# Patient Record
Sex: Female | Born: 1935 | Race: White | Hispanic: No | Smoking: Never smoker
Health system: Southern US, Community
[De-identification: ages and names within clinical notes are randomized; demographics above are authoritative.]

## PROBLEM LIST (undated history)

## (undated) DIAGNOSIS — I639 Cerebral infarction, unspecified: Secondary | ICD-10-CM

## (undated) DIAGNOSIS — M545 Low back pain, unspecified: Secondary | ICD-10-CM

## (undated) DIAGNOSIS — J189 Pneumonia, unspecified organism: Secondary | ICD-10-CM

## (undated) DIAGNOSIS — I10 Essential (primary) hypertension: Secondary | ICD-10-CM

## (undated) DIAGNOSIS — D510 Vitamin B12 deficiency anemia due to intrinsic factor deficiency: Secondary | ICD-10-CM

## (undated) DIAGNOSIS — D72829 Elevated white blood cell count, unspecified: Secondary | ICD-10-CM

## (undated) DIAGNOSIS — Z9289 Personal history of other medical treatment: Secondary | ICD-10-CM

## (undated) DIAGNOSIS — G8929 Other chronic pain: Secondary | ICD-10-CM

## (undated) DIAGNOSIS — H9190 Unspecified hearing loss, unspecified ear: Secondary | ICD-10-CM

## (undated) DIAGNOSIS — C801 Malignant (primary) neoplasm, unspecified: Secondary | ICD-10-CM

## (undated) DIAGNOSIS — A389 Scarlet fever, uncomplicated: Secondary | ICD-10-CM

## (undated) DIAGNOSIS — G459 Transient cerebral ischemic attack, unspecified: Secondary | ICD-10-CM

## (undated) DIAGNOSIS — D509 Iron deficiency anemia, unspecified: Secondary | ICD-10-CM

## (undated) DIAGNOSIS — J449 Chronic obstructive pulmonary disease, unspecified: Secondary | ICD-10-CM

## (undated) DIAGNOSIS — N390 Urinary tract infection, site not specified: Secondary | ICD-10-CM

## (undated) HISTORY — PX: APPENDECTOMY: SHX54

## (undated) HISTORY — DX: Chronic obstructive pulmonary disease, unspecified: J44.9

## (undated) HISTORY — DX: Elevated white blood cell count, unspecified: D72.829

## (undated) HISTORY — PX: KYPHOPLASTY: SHX5884

## (undated) HISTORY — PX: CATARACT EXTRACTION W/ INTRAOCULAR LENS  IMPLANT, BILATERAL: SHX1307

## (undated) HISTORY — DX: Iron deficiency anemia, unspecified: D50.9

## (undated) HISTORY — PX: BACK SURGERY: SHX140

## (undated) HISTORY — PX: ANKLE FRACTURE SURGERY: SHX122

## (undated) HISTORY — PX: LUMBAR DISC SURGERY: SHX700

---
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
## 1971-07-01 HISTORY — PX: ABDOMINAL HYSTERECTOMY: SHX81

## 1998-10-30 ENCOUNTER — Encounter: Payer: Self-pay | Admitting: Neurosurgery

## 1998-11-01 ENCOUNTER — Inpatient Hospital Stay (HOSPITAL_COMMUNITY): Admission: RE | Admit: 1998-11-01 | Discharge: 1998-11-03 | Payer: Self-pay | Admitting: Neurosurgery

## 1998-11-01 ENCOUNTER — Encounter: Payer: Self-pay | Admitting: Neurosurgery

## 1998-11-02 ENCOUNTER — Encounter: Payer: Self-pay | Admitting: Neurosurgery

## 1998-11-03 ENCOUNTER — Encounter: Payer: Self-pay | Admitting: Neurosurgery

## 1998-12-28 ENCOUNTER — Ambulatory Visit (HOSPITAL_COMMUNITY): Admission: RE | Admit: 1998-12-28 | Discharge: 1998-12-28 | Payer: Self-pay | Admitting: Neurosurgery

## 1998-12-28 ENCOUNTER — Encounter: Payer: Self-pay | Admitting: Neurosurgery

## 1999-01-03 ENCOUNTER — Encounter: Payer: Self-pay | Admitting: Neurosurgery

## 1999-01-03 ENCOUNTER — Ambulatory Visit (HOSPITAL_COMMUNITY): Admission: RE | Admit: 1999-01-03 | Discharge: 1999-01-03 | Payer: Self-pay | Admitting: Neurosurgery

## 1999-01-07 ENCOUNTER — Inpatient Hospital Stay (HOSPITAL_COMMUNITY): Admission: RE | Admit: 1999-01-07 | Discharge: 1999-01-08 | Payer: Self-pay | Admitting: Neurosurgery

## 1999-01-11 ENCOUNTER — Ambulatory Visit (HOSPITAL_COMMUNITY): Admission: RE | Admit: 1999-01-11 | Discharge: 1999-01-11 | Payer: Self-pay | Admitting: Neurosurgery

## 1999-09-27 ENCOUNTER — Encounter: Payer: Self-pay | Admitting: Family Medicine

## 1999-09-27 ENCOUNTER — Encounter: Admission: RE | Admit: 1999-09-27 | Discharge: 1999-09-27 | Payer: Self-pay | Admitting: Family Medicine

## 2002-08-01 ENCOUNTER — Encounter: Payer: Self-pay | Admitting: Family Medicine

## 2002-08-01 ENCOUNTER — Encounter: Admission: RE | Admit: 2002-08-01 | Discharge: 2002-08-01 | Payer: Self-pay | Admitting: Family Medicine

## 2005-12-19 ENCOUNTER — Encounter: Payer: Self-pay | Admitting: Emergency Medicine

## 2005-12-19 ENCOUNTER — Ambulatory Visit: Payer: Self-pay | Admitting: Cardiology

## 2005-12-20 ENCOUNTER — Inpatient Hospital Stay (HOSPITAL_COMMUNITY): Admission: EM | Admit: 2005-12-20 | Discharge: 2005-12-26 | Payer: Self-pay | Admitting: Internal Medicine

## 2005-12-22 ENCOUNTER — Ambulatory Visit: Payer: Self-pay | Admitting: Physical Medicine & Rehabilitation

## 2005-12-29 ENCOUNTER — Ambulatory Visit (HOSPITAL_COMMUNITY): Admission: RE | Admit: 2005-12-29 | Discharge: 2005-12-29 | Payer: Self-pay | Admitting: Interventional Radiology

## 2006-01-05 ENCOUNTER — Encounter: Admission: RE | Admit: 2006-01-05 | Discharge: 2006-01-29 | Payer: Self-pay | Admitting: Internal Medicine

## 2011-07-01 DIAGNOSIS — Z9289 Personal history of other medical treatment: Secondary | ICD-10-CM

## 2011-07-01 HISTORY — DX: Personal history of other medical treatment: Z92.89

## 2012-03-17 ENCOUNTER — Inpatient Hospital Stay (HOSPITAL_COMMUNITY)
Admission: EM | Admit: 2012-03-17 | Discharge: 2012-03-24 | DRG: 469 | Disposition: A | Discharge status: Skilled Nursing Facility | Payer: Medicare Other | Attending: Internal Medicine | Admitting: Internal Medicine

## 2012-03-17 ENCOUNTER — Inpatient Hospital Stay (HOSPITAL_COMMUNITY): Payer: Medicare Other

## 2012-03-17 ENCOUNTER — Encounter (HOSPITAL_COMMUNITY)
Admission: EM | Disposition: A | Discharge status: Skilled Nursing Facility | Payer: Self-pay | Source: Home / Self Care | Attending: Internal Medicine

## 2012-03-17 ENCOUNTER — Encounter (HOSPITAL_COMMUNITY): Payer: Self-pay | Admitting: *Deleted

## 2012-03-17 ENCOUNTER — Emergency Department (HOSPITAL_COMMUNITY): Payer: Medicare Other

## 2012-03-17 ENCOUNTER — Inpatient Hospital Stay (HOSPITAL_COMMUNITY): Payer: Medicare Other | Admitting: *Deleted

## 2012-03-17 DIAGNOSIS — S72001A Fracture of unspecified part of neck of right femur, initial encounter for closed fracture: Secondary | ICD-10-CM | POA: Diagnosis present

## 2012-03-17 DIAGNOSIS — Y92009 Unspecified place in unspecified non-institutional (private) residence as the place of occurrence of the external cause: Secondary | ICD-10-CM

## 2012-03-17 DIAGNOSIS — J189 Pneumonia, unspecified organism: Secondary | ICD-10-CM

## 2012-03-17 DIAGNOSIS — R0902 Hypoxemia: Secondary | ICD-10-CM | POA: Diagnosis present

## 2012-03-17 DIAGNOSIS — D696 Thrombocytopenia, unspecified: Secondary | ICD-10-CM | POA: Diagnosis present

## 2012-03-17 DIAGNOSIS — S72009A Fracture of unspecified part of neck of unspecified femur, initial encounter for closed fracture: Principal | ICD-10-CM | POA: Diagnosis present

## 2012-03-17 DIAGNOSIS — J4489 Other specified chronic obstructive pulmonary disease: Secondary | ICD-10-CM | POA: Diagnosis present

## 2012-03-17 DIAGNOSIS — D62 Acute posthemorrhagic anemia: Secondary | ICD-10-CM | POA: Diagnosis not present

## 2012-03-17 DIAGNOSIS — Z7901 Long term (current) use of anticoagulants: Secondary | ICD-10-CM

## 2012-03-17 DIAGNOSIS — Z79899 Other long term (current) drug therapy: Secondary | ICD-10-CM

## 2012-03-17 DIAGNOSIS — G8190 Hemiplegia, unspecified affecting unspecified side: Secondary | ICD-10-CM

## 2012-03-17 DIAGNOSIS — W010XXA Fall on same level from slipping, tripping and stumbling without subsequent striking against object, initial encounter: Secondary | ICD-10-CM | POA: Diagnosis present

## 2012-03-17 DIAGNOSIS — D649 Anemia, unspecified: Secondary | ICD-10-CM

## 2012-03-17 DIAGNOSIS — I63511 Cerebral infarction due to unspecified occlusion or stenosis of right middle cerebral artery: Secondary | ICD-10-CM

## 2012-03-17 DIAGNOSIS — D509 Iron deficiency anemia, unspecified: Secondary | ICD-10-CM

## 2012-03-17 DIAGNOSIS — J449 Chronic obstructive pulmonary disease, unspecified: Secondary | ICD-10-CM

## 2012-03-17 DIAGNOSIS — D72829 Elevated white blood cell count, unspecified: Secondary | ICD-10-CM

## 2012-03-17 DIAGNOSIS — I6350 Cerebral infarction due to unspecified occlusion or stenosis of unspecified cerebral artery: Secondary | ICD-10-CM | POA: Diagnosis not present

## 2012-03-17 DIAGNOSIS — I639 Cerebral infarction, unspecified: Secondary | ICD-10-CM | POA: Diagnosis present

## 2012-03-17 DIAGNOSIS — Z8673 Personal history of transient ischemic attack (TIA), and cerebral infarction without residual deficits: Secondary | ICD-10-CM

## 2012-03-17 DIAGNOSIS — I10 Essential (primary) hypertension: Secondary | ICD-10-CM

## 2012-03-17 DIAGNOSIS — K5900 Constipation, unspecified: Secondary | ICD-10-CM | POA: Diagnosis present

## 2012-03-17 HISTORY — DX: Essential (primary) hypertension: I10

## 2012-03-17 HISTORY — PX: HIP ARTHROPLASTY: SHX981

## 2012-03-17 LAB — COMPREHENSIVE METABOLIC PANEL
ALT: 9 U/L (ref 0–35)
Albumin: 3.2 g/dL — ABNORMAL LOW (ref 3.5–5.2)
Alkaline Phosphatase: 105 U/L (ref 39–117)
BUN: 15 mg/dL (ref 6–23)
Chloride: 99 mEq/L (ref 96–112)
GFR calc Af Amer: 81 mL/min — ABNORMAL LOW (ref >90)
GFR calc non Af Amer: 70 mL/min — ABNORMAL LOW (ref >90)
Glucose, Bld: 170 mg/dL — ABNORMAL HIGH (ref 70–99)
Potassium: 4.7 mEq/L (ref 3.5–5.1)
Sodium: 135 mEq/L (ref 135–145)
Total Protein: 6.5 g/dL (ref 6.0–8.3)

## 2012-03-17 LAB — CBC WITH DIFFERENTIAL/PLATELET
Basophils Relative: 0 % (ref 0–1)
Eosinophils Relative: 0 % (ref 0–5)
Hemoglobin: 11.8 g/dL — ABNORMAL LOW (ref 12.0–15.0)
Lymphocytes Relative: 8 % — ABNORMAL LOW (ref 12–46)
MCH: 25.3 pg — ABNORMAL LOW (ref 26.0–34.0)
MCV: 78.6 fL (ref 78.0–100.0)
Platelets: 219 10*3/uL (ref 150–400)

## 2012-03-17 LAB — PROTIME-INR: INR: 1.09 (ref 0.00–1.49)

## 2012-03-17 LAB — APTT: aPTT: 29 seconds (ref 24–37)

## 2012-03-17 SURGERY — HEMIARTHROPLASTY, HIP, DIRECT ANTERIOR APPROACH, FOR FRACTURE
Anesthesia: General | Site: Hip | Laterality: Right | Wound class: Clean

## 2012-03-17 MED ORDER — PHENYLEPHRINE HCL 10 MG/ML IJ SOLN
INTRAMUSCULAR | Status: DC | PRN
  Administered 2012-03-17 (×4): 40 ug via INTRAVENOUS
  Administered 2012-03-17 (×2): 80 ug via INTRAVENOUS
  Administered 2012-03-17: 60 ug via INTRAVENOUS
  Administered 2012-03-17: 80 ug via INTRAVENOUS
  Administered 2012-03-17: 60 ug via INTRAVENOUS
  Administered 2012-03-17: 40 ug via INTRAVENOUS
  Administered 2012-03-17: 80 ug via INTRAVENOUS
  Administered 2012-03-17: 40 ug via INTRAVENOUS

## 2012-03-17 MED ORDER — FENTANYL CITRATE 0.05 MG/ML IJ SOLN
INTRAMUSCULAR | Status: DC | PRN
  Administered 2012-03-17: 40 ug via INTRAVENOUS

## 2012-03-17 MED ORDER — OXYCODONE HCL 5 MG PO TABS
5.0000 mg | ORAL_TABLET | ORAL | Status: DC | PRN
  Administered 2012-03-18 – 2012-03-20 (×4): 5 mg via ORAL
  Filled 2012-03-17 (×4): qty 1

## 2012-03-17 MED ORDER — SENNA 8.6 MG PO TABS
1.0000 | ORAL_TABLET | Freq: Two times a day (BID) | ORAL | Status: DC
  Administered 2012-03-18 – 2012-03-24 (×12): 8.6 mg via ORAL
  Filled 2012-03-17 (×14): qty 1

## 2012-03-17 MED ORDER — HYDROCODONE-ACETAMINOPHEN 5-325 MG PO TABS: 1.0000 | ORAL_TABLET | Freq: Four times a day (QID) | ORAL | Status: DC | PRN

## 2012-03-17 MED ORDER — SODIUM CHLORIDE 0.9 % IR SOLN
Status: DC | PRN
  Administered 2012-03-17: 1

## 2012-03-17 MED ORDER — DOCUSATE SODIUM 100 MG PO CAPS
100.0000 mg | ORAL_CAPSULE | Freq: Two times a day (BID) | ORAL | Status: DC
  Administered 2012-03-18 – 2012-03-24 (×12): 100 mg via ORAL
  Filled 2012-03-17 (×14): qty 1

## 2012-03-17 MED ORDER — HYDROMORPHONE HCL PF 1 MG/ML IJ SOLN: 0.2500 mg | INTRAMUSCULAR | Status: DC | PRN

## 2012-03-17 MED ORDER — WARFARIN - PHARMACIST DOSING INPATIENT: Freq: Every day | Status: DC

## 2012-03-17 MED ORDER — HYDROCODONE-ACETAMINOPHEN 5-325 MG PO TABS
1.0000 | ORAL_TABLET | ORAL | Status: DC | PRN
  Administered 2012-03-18: 1 via ORAL
  Filled 2012-03-17: qty 1

## 2012-03-17 MED ORDER — WARFARIN SODIUM 2.5 MG PO TABS
2.5000 mg | ORAL_TABLET | Freq: Once | ORAL | Status: AC
  Administered 2012-03-18: 2.5 mg via ORAL
  Filled 2012-03-17: qty 1

## 2012-03-17 MED ORDER — MORPHINE SULFATE 2 MG/ML IJ SOLN: 1.0000 mg | INTRAMUSCULAR | Status: DC | PRN

## 2012-03-17 MED ORDER — WARFARIN VIDEO: 1.0000 | Freq: Once | Status: DC

## 2012-03-17 MED ORDER — MORPHINE SULFATE 4 MG/ML IJ SOLN
4.0000 mg | Freq: Once | INTRAMUSCULAR | Status: AC
  Administered 2012-03-17: 4 mg via INTRAVENOUS
  Filled 2012-03-17: qty 1

## 2012-03-17 MED ORDER — MORPHINE SULFATE 2 MG/ML IJ SOLN: 2.0000 mg | Freq: Once | INTRAMUSCULAR | Status: DC

## 2012-03-17 MED ORDER — OXYCODONE HCL 5 MG PO TABS: 5.0000 mg | ORAL_TABLET | Freq: Once | ORAL | Status: DC | PRN

## 2012-03-17 MED ORDER — COUMADIN BOOK
1.0000 | Freq: Once | Status: DC
  Filled 2012-03-17: qty 1

## 2012-03-17 MED ORDER — ASPIRIN EC 81 MG PO TBEC
162.0000 mg | DELAYED_RELEASE_TABLET | Freq: Every day | ORAL | Status: DC
  Administered 2012-03-18 – 2012-03-22 (×5): 162 mg via ORAL
  Filled 2012-03-17 (×5): qty 2

## 2012-03-17 MED ORDER — VITAMIN D3 25 MCG (1000 UNIT) PO TABS
1000.0000 [IU] | ORAL_TABLET | Freq: Every day | ORAL | Status: DC
  Administered 2012-03-18 – 2012-03-24 (×7): 1000 [IU] via ORAL
  Filled 2012-03-17 (×7): qty 1

## 2012-03-17 MED ORDER — LACTATED RINGERS IV SOLN
INTRAVENOUS | Status: DC | PRN
  Administered 2012-03-17 (×2): via INTRAVENOUS

## 2012-03-17 MED ORDER — METHOCARBAMOL 500 MG PO TABS
500.0000 mg | ORAL_TABLET | Freq: Four times a day (QID) | ORAL | Status: DC | PRN
  Administered 2012-03-18 – 2012-03-21 (×5): 500 mg via ORAL
  Filled 2012-03-17 (×5): qty 1

## 2012-03-17 MED ORDER — HYDROMORPHONE HCL PF 1 MG/ML IJ SOLN: 0.5000 mg | INTRAMUSCULAR | Status: DC | PRN

## 2012-03-17 MED ORDER — METOPROLOL TARTRATE 50 MG PO TABS
50.0000 mg | ORAL_TABLET | Freq: Two times a day (BID) | ORAL | Status: DC
  Administered 2012-03-18 – 2012-03-23 (×12): 50 mg via ORAL
  Filled 2012-03-17 (×16): qty 1

## 2012-03-17 MED ORDER — BUPIVACAINE HCL 0.75 % IJ SOLN
INTRAMUSCULAR | Status: DC | PRN
  Administered 2012-03-17: 10 mg

## 2012-03-17 MED ORDER — DIPHENHYDRAMINE HCL 50 MG/ML IJ SOLN
25.0000 mg | Freq: Once | INTRAMUSCULAR | Status: AC
  Administered 2012-03-17: 25 mg via INTRAVENOUS
  Filled 2012-03-17: qty 1

## 2012-03-17 MED ORDER — ONDANSETRON HCL 4 MG/2ML IJ SOLN: 4.0000 mg | Freq: Three times a day (TID) | INTRAMUSCULAR | Status: DC | PRN

## 2012-03-17 MED ORDER — OXYCODONE HCL 5 MG/5ML PO SOLN: 5.0000 mg | Freq: Once | ORAL | Status: DC | PRN

## 2012-03-17 MED ORDER — LACTATED RINGERS IV SOLN
INTRAVENOUS | Status: DC
  Administered 2012-03-17 – 2012-03-18 (×2): via INTRAVENOUS
  Administered 2012-03-20: 50 mL/h via INTRAVENOUS

## 2012-03-17 MED ORDER — CEFAZOLIN SODIUM 1-5 GM-% IV SOLN
1.0000 g | Freq: Three times a day (TID) | INTRAVENOUS | Status: DC
  Administered 2012-03-18: 1 g via INTRAVENOUS
  Filled 2012-03-17 (×4): qty 50

## 2012-03-17 MED ORDER — ENOXAPARIN SODIUM 30 MG/0.3ML ~~LOC~~ SOLN
30.0000 mg | Freq: Every day | SUBCUTANEOUS | Status: DC
  Administered 2012-03-18 – 2012-03-20 (×3): 30 mg via SUBCUTANEOUS
  Filled 2012-03-17 (×4): qty 0.3

## 2012-03-17 MED ORDER — MORPHINE SULFATE 2 MG/ML IJ SOLN: 0.5000 mg | INTRAMUSCULAR | Status: DC | PRN

## 2012-03-17 MED ORDER — CEFAZOLIN SODIUM 1-5 GM-% IV SOLN
INTRAVENOUS | Status: DC | PRN
  Administered 2012-03-17: 1 g via INTRAVENOUS

## 2012-03-17 MED ORDER — PROPOFOL 10 MG/ML IV BOLUS
INTRAVENOUS | Status: DC | PRN
  Administered 2012-03-17: 20 mg via INTRAVENOUS

## 2012-03-17 MED ORDER — SODIUM CHLORIDE 0.9 % IV SOLN: INTRAVENOUS | Status: DC

## 2012-03-17 MED ORDER — MEPERIDINE HCL 25 MG/ML IJ SOLN: 6.2500 mg | INTRAMUSCULAR | Status: DC | PRN

## 2012-03-17 SURGICAL SUPPLY — 50 items
BLADE SAW SAG 73X25 THK (BLADE) ×1
BLADE SAW SGTL 73X25 THK (BLADE) ×1 IMPLANT
BRUSH FEMORAL CANAL (MISCELLANEOUS) IMPLANT
CLOTH BEACON ORANGE TIMEOUT ST (SAFETY) ×2 IMPLANT
COVER BACK TABLE 24X17X13 BIG (DRAPES) IMPLANT
COVER SURGICAL LIGHT HANDLE (MISCELLANEOUS) ×2 IMPLANT
DRAPE HIP W/POCKET STRL (DRAPE) ×2 IMPLANT
DRAPE INCISE IOBAN 85X60 (DRAPES) ×4 IMPLANT
DRAPE ORTHO SPLIT 77X108 STRL (DRAPES)
DRAPE SURG ORHT 6 SPLT 77X108 (DRAPES) ×2 IMPLANT
DRAPE U-SHAPE 47X51 STRL (DRAPES) ×2 IMPLANT
DRILL BIT 7/64X5 (BIT) ×1 IMPLANT
DRSG MEPILEX BORDER 4X8 (GAUZE/BANDAGES/DRESSINGS) ×2 IMPLANT
DURAPREP 26ML APPLICATOR (WOUND CARE) ×2 IMPLANT
ELECT BLADE 6.5 EXT (BLADE) IMPLANT
ELECT CAUTERY BLADE 6.4 (BLADE) ×2 IMPLANT
ELECT REM PT RETURN 9FT ADLT (ELECTROSURGICAL) ×2
ELECTRODE REM PT RTRN 9FT ADLT (ELECTROSURGICAL) ×1 IMPLANT
EVACUATOR 1/8 PVC DRAIN (DRAIN) IMPLANT
GLOVE BIOGEL PI IND STRL 8 (GLOVE) ×1 IMPLANT
GLOVE BIOGEL PI INDICATOR 8 (GLOVE) ×1
GLOVE ORTHO TXT STRL SZ7.5 (GLOVE) ×4 IMPLANT
GOWN PREVENTION PLUS LG XLONG (DISPOSABLE) IMPLANT
GOWN PREVENTION PLUS XLARGE (GOWN DISPOSABLE) ×2 IMPLANT
GOWN STRL NON-REIN LRG LVL3 (GOWN DISPOSABLE) ×3 IMPLANT
HANDPIECE INTERPULSE COAX TIP (DISPOSABLE)
KIT BASIN OR (CUSTOM PROCEDURE TRAY) ×2 IMPLANT
KIT ROOM TURNOVER OR (KITS) ×2 IMPLANT
MANIFOLD NEPTUNE II (INSTRUMENTS) ×1 IMPLANT
NS IRRIG 1000ML POUR BTL (IV SOLUTION) ×2 IMPLANT
PACK TOTAL JOINT (CUSTOM PROCEDURE TRAY) ×2 IMPLANT
PAD ARMBOARD 7.5X6 YLW CONV (MISCELLANEOUS) ×4 IMPLANT
PASSER SUT SWANSON 36MM LOOP (INSTRUMENTS) ×2 IMPLANT
SET HNDPC FAN SPRY TIP SCT (DISPOSABLE) IMPLANT
SLEEVE CABLE 2MM VT (Orthopedic Implant) ×3 IMPLANT
SPONGE LAP 18X18 X RAY DECT (DISPOSABLE) ×1 IMPLANT
SPONGE LAP 4X18 X RAY DECT (DISPOSABLE) IMPLANT
STAPLER VISISTAT 35W (STAPLE) ×2 IMPLANT
SUCTION FRAZIER TIP 10 FR DISP (SUCTIONS) ×1 IMPLANT
SUT ETHIBOND NAB CT1 #1 30IN (SUTURE) ×4 IMPLANT
SUT FIBERWIRE #2 38 T-5 BLUE (SUTURE) ×4
SUT VIC AB 1 CTB1 27 (SUTURE) ×4 IMPLANT
SUT VIC AB 2-0 CT1 27 (SUTURE) ×4
SUT VIC AB 2-0 CT1 TAPERPNT 27 (SUTURE) ×2 IMPLANT
SUTURE FIBERWR #2 38 T-5 BLUE (SUTURE) ×2 IMPLANT
TOWEL OR 17X24 6PK STRL BLUE (TOWEL DISPOSABLE) ×2 IMPLANT
TOWEL OR 17X26 10 PK STRL BLUE (TOWEL DISPOSABLE) ×2 IMPLANT
TOWER CARTRIDGE SMART MIX (DISPOSABLE) IMPLANT
TRAY FOLEY CATH 14FR (SET/KITS/TRAYS/PACK) ×1 IMPLANT
WATER STERILE IRR 1000ML POUR (IV SOLUTION) ×6 IMPLANT

## 2012-03-17 NOTE — Progress Notes (Signed)
Disposition Note  Holly Allen, is a 78 y.o. female,   MRN: [id number]  -  DOB - 1914/12/15  Outpatient Primary MD for the patient is DEFAULT,PROVIDER, MD PCP is Dr. Andrey Campanile at [facility].  Blood pressure 104/52, pulse 71, temperature 97.2 F (36.2 C), temperature source Oral, resp. rate 16, SpO2 100.00%.  Active Problems:  Fracture of femoral neck, right   78 yo with minimal PMH other than hypertension suffered a mechanical fall today and fractured her right femoral neck.  The EDP has consulted Orthopedic Surgery (Dr. August Saucer) who will operate on the patient either tonight or tomorrow morning.    Patient's labs show and elevated WBC count - as would be expected with a fracture, but otherwise appear stable.  Her vital signs are stable.  I have requested a med surg bed.  Algis Downs, PA-C [facility] Pager: [phone]

## 2012-03-17 NOTE — Anesthesia Procedure Notes (Signed)
Spinal  Patient location during procedure: OR Start time: 03/17/2012 7:15 PM End time: 03/17/2012 7:27 PM Staffing Anesthesiologist: Ester Rink TERRILL Performed by: anesthesiologist  Preanesthetic Checklist Completed: patient identified, site marked, surgical consent, pre-op evaluation, timeout performed, IV checked, risks and benefits discussed and monitors and equipment checked Spinal Block Patient position: right lateral decubitus Prep: Betadine Patient monitoring: heart rate, cardiac monitor, continuous pulse ox and blood pressure Approach: right paramedian Location: L3-4 Injection technique: single-shot Needle Needle type: Tuohy  Needle gauge: 22 G Needle length: 5 cm Assessment Sensory level: T6 Additional Notes Tolerated well

## 2012-03-17 NOTE — Transfer of Care (Signed)
Immediate Anesthesia Transfer of Care Note  Patient: Holly Allen  Procedure(s) Performed: Procedure(s) (LRB) with comments: ARTHROPLASTY BIPOLAR HIP (Right)  Patient Location: PACU  Anesthesia Type: Regional  Level of Consciousness: awake, alert , oriented and patient cooperative  Airway & Oxygen Therapy: Patient Spontanous Breathing and Patient connected to nasal cannula oxygen  Post-op Assessment: Report given to PACU RN and Post -op Vital signs reviewed and stable  Post vital signs: Reviewed and stable  Complications: No apparent anesthesia complications

## 2012-03-17 NOTE — Anesthesia Preprocedure Evaluation (Signed)
Anesthesia Evaluation  Patient identified by MRN, date of birth, ID band Patient awake    History of Anesthesia Complications Negative for: history of anesthetic complications  Airway Mallampati: II      Dental  (+) Teeth Intact   Pulmonary COPD breath sounds clear to auscultation        Cardiovascular hypertension, Rhythm:Regular Rate:Normal     Neuro/Psych CVA negative psych ROS   GI/Hepatic negative GI ROS, Neg liver ROS,   Endo/Other  negative endocrine ROS  Renal/GU negative Renal ROS     Musculoskeletal negative musculoskeletal ROS (+)   Abdominal   Peds  Hematology negative hematology ROS (+)   Anesthesia Other Findings   Reproductive/Obstetrics                           Anesthesia Physical Anesthesia Plan  ASA: III  Anesthesia Plan: Spinal   Post-op Pain Management:    Induction: Intravenous  Airway Management Planned: Simple Face Mask  Additional Equipment:   Intra-op Plan:   Post-operative Plan:   Informed Consent: I have reviewed the patients History and Physical, chart, labs and discussed the procedure including the risks, benefits and alternatives for the proposed anesthesia with the patient or authorized representative who has indicated his/her understanding and acceptance.   Dental advisory given  Plan Discussed with: CRNA and Surgeon  Anesthesia Plan Comments:         Anesthesia Quick Evaluation

## 2012-03-17 NOTE — ED Notes (Signed)
Report to [employer]- Will await OR in CDU.

## 2012-03-17 NOTE — ED Notes (Signed)
Report to OR Holding- patient transported via stretcher.

## 2012-03-17 NOTE — Preoperative (Signed)
Beta Blockers   Reason not to administer Beta Blockers:Not Applicable 

## 2012-03-17 NOTE — Consult Note (Signed)
Reason for Consult:  Right hip fracture Referring Physician:   EDP  Holly Allen is an 78 y.o. female.  HPI:   78 yo female community ambulater who lives alone.  She sustained an accidental mechanical fall when she tripped outside this am.  She managed to crawl into her house and was found by family.  She had the inability to bear weight on her right leg and EMS brought her to [facility] where she was found to have a right hip fracture.  She denies other injuries and only reports right hip pain.  She also denies LOC, CP or SOB.  Past Medical History  Diagnosis Date  . Hypertension     History reviewed. No pertinent past surgical history.  History reviewed. No pertinent family history.  Social History:  does not have a smoking history on file. She does not have any smokeless tobacco history on file. Her alcohol and drug histories not on file.  Allergies: No Known Allergies  Medications: I have reviewed the patient's current medications.  Results for orders placed during the hospital encounter of 03/17/12 (from the past 48 hour(s))  CBC WITH DIFFERENTIAL     Status: Abnormal   Collection Time   03/17/12  1:21 PM      Component Value Range Comment   WBC 18.0 (*) 4.0 - 10.5 K/uL    RBC 4.67  3.87 - 5.11 MIL/uL    Hemoglobin 11.8 (*) 12.0 - 15.0 g/dL    HCT 16.1  09.6 - 04.5 %    MCV 78.6  78.0 - 100.0 fL    MCH 25.3 (*) 26.0 - 34.0 pg    MCHC 32.2  30.0 - 36.0 g/dL    RDW 40.9  81.1 - 91.4 %    Platelets 219  150 - 400 K/uL    Neutrophils Relative 85 (*) 43 - 77 %    Neutro Abs 15.2 (*) 1.7 - 7.7 K/uL    Lymphocytes Relative 8 (*) 12 - 46 %    Lymphs Abs 1.4  0.7 - 4.0 K/uL    Monocytes Relative 8  3 - 12 %    Monocytes Absolute 1.4 (*) 0.1 - 1.0 K/uL    Eosinophils Relative 0  0 - 5 %    Eosinophils Absolute 0.0  0.0 - 0.7 K/uL    Basophils Relative 0  0 - 1 %    Basophils Absolute 0.0  0.0 - 0.1 K/uL   COMPREHENSIVE METABOLIC PANEL     Status: Abnormal   Collection Time     03/17/12  1:21 PM      Component Value Range Comment   Sodium 135  135 - 145 mEq/L    Potassium 4.7  3.5 - 5.1 mEq/L    Chloride 99  96 - 112 mEq/L    CO2 27  19 - 32 mEq/L    Glucose, Bld 170 (*) 70 - 99 mg/dL    BUN 15  6 - 23 mg/dL    Creatinine, Ser 7.82  0.50 - 1.10 mg/dL    Calcium 9.1  8.4 - 95.6 mg/dL    Total Protein 6.5  6.0 - 8.3 g/dL    Albumin 3.2 (*) 3.5 - 5.2 g/dL    AST 19  0 - 37 U/L    ALT 9  0 - 35 U/L    Alkaline Phosphatase 105  39 - 117 U/L    Total Bilirubin 0.4  0.3 - 1.2 mg/dL  GFR calc non Af Amer 70 (*) >90 mL/min    GFR calc Af Amer 81 (*) >90 mL/min   APTT     Status: Normal   Collection Time   03/17/12  1:21 PM      Component Value Range Comment   aPTT 29  24 - 37 seconds   PROTIME-INR     Status: Normal   Collection Time   03/17/12  1:21 PM      Component Value Range Comment   Prothrombin Time 14.0  11.6 - 15.2 seconds    INR 1.09  0.00 - 1.49     Dg Chest 1 View  03/17/2012  *RADIOLOGY REPORT*  Clinical Data: Right hip fracture, fall  CHEST - 1 VIEW  Comparison: 04/10/2010  Findings: Upper normal heart size. Calcified tortuous aorta. Enlarged central pulmonary arteries, likely accounting for bilateral hilar prominence, greater on the right. Lungs appear emphysematous with chronic accentuation of interstitial markings at mid-to-lower lungs. No definite infiltrate, pleural effusion or pneumothorax. Marked osseous demineralization. Stable sclerotic focus in a mid thoracic vertebra.  IMPRESSION: Emphysematous and chronic interstitial lung disease changes. Question pulmonary arterial hypertension. No acute abnormalities.   Original Report Authenticated By: Lollie Marrow, M.D.    Dg Hip Complete Right  03/17/2012  *RADIOLOGY REPORT*  Clinical Data: Right hip pain post fall  RIGHT HIP - COMPLETE 2+ VIEW  Comparison: None  Findings: Osseous demineralization. Displaced right femoral neck fracture with overriding. No dislocation or additional fractures  identified. Mild narrowing of left hip joint with preservation of right hip joint space. Pelvis appears intact.  IMPRESSION: Displaced right femoral neck fracture.   Original Report Authenticated By: Lollie Marrow, M.D.     Review of Systems  All other systems reviewed and are negative.   Blood pressure 105/53, pulse 75, temperature 97.2 F (36.2 C), temperature source Oral, resp. rate 16, SpO2 100.00%. Physical Exam  Musculoskeletal:       Right hip: She exhibits decreased range of motion, bony tenderness and deformity.    Assessment/Plan: Right hip displaced femoral neck fracture 1) After speaking to the patient and her family, we have decided to proceed with a right hip hemiarthroplasty to treat the injury.  This will allow early mobilization and decreased pain.  The risks are acute blood loss anemia, infection, fracture, nerve injury, DVT and death.  The goals are improved quality of life.  She has been seen by [facility] and cleared for surgery.  Kathryne Hitch 03/17/2012, 5:22 PM

## 2012-03-17 NOTE — Brief Op Note (Signed)
03/17/2012  9:17 PM  PATIENT:  Linna Hoff  78 y.o. female  PRE-OPERATIVE DIAGNOSIS:  Right Femoral Neck Fracture  POST-OPERATIVE DIAGNOSIS:  same   PROCEDURE:  Procedure(s) (LRB) with comments: ARTHROPLASTY BIPOLAR HIP (Right)  SURGEON:  Surgeon(s) and Role:    * Kathryne Hitch, MD - Primary  PHYSICIAN ASSISTANT:   ASSISTANTS: none   ANESTHESIA:   spinal  EBL:  Total I/O In: 1300 [I.V.:1300] Out: 200 [Blood:200]  BLOOD ADMINISTERED:none  DRAINS: none   LOCAL MEDICATIONS USED:  NONE  SPECIMEN:  No Specimen  DISPOSITION OF SPECIMEN:  N/A  COUNTS:  YES  TOURNIQUET:  * No tourniquets in log *  DICTATION: .Other Dictation: Dictation Number [id number]  PLAN OF CARE: Admit to inpatient   PATIENT DISPOSITION:  PACU - hemodynamically stable.   Delay start of Pharmacological VTE agent (>24hrs) due to surgical blood loss or risk of bleeding: no

## 2012-03-17 NOTE — H&P (Signed)
[facility] History and Physical  Holly Allen HYQ:[id number] DOB: [birth year] DOA: 03/17/2012   PCP: Sheila Oats, MD   Chief Complaint: Right hip pain  HPI:  78 year old female who presents to the emergency department after a mechanical fall in her driveway. Apparently, the patient was walking out of her house to throw something away in the trash. On her way back, the patient took a misstep and fell onto her right hip. The patient was not able to get up. The patient denied any prodromal symptoms. She denied any dizziness, chest discomfort, palpitations, visual changes, headache. She denies any recent history of fevers, chills, sweats, weight loss, dysuria, abdominal pain. The patient has not been placed on any new medications. She was in her usual state of health. X-rays in the emergency department revealed a displaced right femoral neck fracture. Orthopedics was asked internal medicine to admit. Of note, the patient had a remote stroke in 2007 without any significant sequelae. She has not had any problems with TIA since that period of time. Assessment/Plan: Right femoral neck fracture -Secondary to mechanical fall -Orthopedics to perform right hemiarthroplasty -Patient will  need DVT prophylaxis postoperatively x35 days -PT/OT evaluation postop Hypertension -Continue home dose of metoprolol tartrate History of stroke, ischemic cerebellum (2007) -Continue aspirin postop Leukocytosis -Likely reactive due to recent events -check UA     Past Medical History  Diagnosis Date  . Hypertension    History reviewed. No pertinent past surgical history. Social History:  does not have a smoking history on file. She does not have any smokeless tobacco history on file. Her alcohol and drug histories not on file.  No Known Allergies  History reviewed. No pertinent family history.  Prior to Admission medications   Medication Sig Start Date End Date Taking? Authorizing Provider    aspirin EC 81 MG tablet Take 162 mg by mouth daily.    Yes Historical Provider, MD  Cholecalciferol (VITAMIN D PO) Take 1 tablet by mouth every evening.   Yes Historical Provider, MD  metoprolol (LOPRESSOR) 50 MG tablet Take 50 mg by mouth 2 (two) times daily.   Yes Historical Provider, MD    Review of Systems:  Constitutional:  No weight loss, night sweats, Fevers, chills, fatigue.  Head&Eyes: No headache.  No vision loss.  No eye pain or scotoma ENT:  No Difficulty swallowing,Tooth/dental problems,Sore throat,  No ear ache, post nasal drip,  Cardio-vascular:  No chest pain, Orthopnea, PND, swelling in lower extremities,  dizziness, palpitations  GI:  No heartburn, indigestion, abdominal pain, nausea, vomiting, diarrhea,loss of appetite, hematochezia, melena Resp:  No shortness of breath with exertion or at rest. No excess mucus, no productive cough, No non-productive cough, No coughing up of blood.No change in color of mucus.No wheezing.No chest wall deformity  Skin:  no rash.  But developed left arm rash after morphine in ED GU:  no dysuria, change in color of urine, no urgency or frequency. No flank pain.  Musculoskeletal:  Right hip pain.  Psych:  No change in mood or affect. No depression or anxiety. Neurologic: No headache, no dysesthesia, no focal weakness, no vision loss. No syncope  Physical Exam: Filed Vitals:   03/17/12 1445 03/17/12 1446 03/17/12 1533 03/17/12 1600  BP: 113/54 113/54 104/52 105/53  Pulse: 67 72 71 75  Temp:   97.2 F (36.2 C)   TempSrc:   Oral   Resp:  14 16   SpO2: 97% 93% 100% 100%   General:  A&O x 3,  NAD, nontoxic, pleasant/cooperative Head/Eye: No conjunctival hemorrhage, no icterus, [place]/AT, No nystagmus ENT:  No icterus,  No thrush, good dentition, no pharyngeal exudate Neck:  No masses, no lymphadenpathy, no bruits CV:  RRR, no rub, no gallop, no S3, no carotid bruits Lung:  CTAB, good air movement, no wheeze, no rhonchi Abdomen:  soft/NT, +BS, nondistended, no peritoneal signs Ext: No cyanosis, No rashes, No petechiae, No lymphangitis, No edema  Labs on Admission:  Basic Metabolic Panel:  Lab 03/17/12 1610  NA 135  K 4.7  CL 99  CO2 27  GLUCOSE 170*  BUN 15  CREATININE 0.75  CALCIUM 9.1  MG --  PHOS --   Liver Function Tests:  Lab 03/17/12 1321  AST 19  ALT 9  ALKPHOS 105  BILITOT 0.4  PROT 6.5  ALBUMIN 3.2*   No results found for this basename: LIPASE:5,AMYLASE:5 in the last 168 hours No results found for this basename: AMMONIA:5 in the last 168 hours CBC:  Lab 03/17/12 1321  WBC 18.0*  NEUTROABS 15.2*  HGB 11.8*  HCT 36.7  MCV 78.6  PLT 219   Cardiac Enzymes: No results found for this basename: CKTOTAL:5,CKMB:5,CKMBINDEX:5,TROPONINI:5 in the last 168 hours BNP: No components found with this basename: POCBNP:5 CBG: No results found for this basename: GLUCAP:5 in the last 168 hours  Radiological Exams on Admission: Dg Chest 1 View  03/17/2012  *RADIOLOGY REPORT*  Clinical Data: Right hip fracture, fall  CHEST - 1 VIEW  Comparison: 04/10/2010  Findings: Upper normal heart size. Calcified tortuous aorta. Enlarged central pulmonary arteries, likely accounting for bilateral hilar prominence, greater on the right. Lungs appear emphysematous with chronic accentuation of interstitial markings at mid-to-lower lungs. No definite infiltrate, pleural effusion or pneumothorax. Marked osseous demineralization. Stable sclerotic focus in a mid thoracic vertebra.  IMPRESSION: Emphysematous and chronic interstitial lung disease changes. Question pulmonary arterial hypertension. No acute abnormalities.   Original Report Authenticated By: Lollie Marrow, M.D.    Dg Hip Complete Right  03/17/2012  *RADIOLOGY REPORT*  Clinical Data: Right hip pain post fall  RIGHT HIP - COMPLETE 2+ VIEW  Comparison: None  Findings: Osseous demineralization. Displaced right femoral neck fracture with overriding. No dislocation or  additional fractures identified. Mild narrowing of left hip joint with preservation of right hip joint space. Pelvis appears intact.  IMPRESSION: Displaced right femoral neck fracture.   Original Report Authenticated By: Lollie Marrow, M.D.     EKG: Independently reviewed. Sinus rhythm, no ST-T wave changes    Time spend:70 minutes Code Status: Full Family Communication: Updated family at the bedside   Shamirah Ivan, DO  [facility] Pager [phone]  If 7PM-7AM, please contact night-coverage [email or web address] Password [facility] 03/17/2012, 5:52 PM

## 2012-03-17 NOTE — ED Provider Notes (Addendum)
History     CSN: [id number]  Arrival date & time 03/17/12  1258   First MD Initiated Contact with Patient 03/17/12 1312      Chief Complaint  Patient presents with  . Fall    (Consider location/radiation/quality/duration/timing/severity/associated sxs/prior treatment) HPI Comments: Patient walked out of the house to throw something away.  On her way back, she fell and injured her right hip.  She was unable to get up and it took her about an hour to scoot back into the house to get help.  She denies other injury.  No other complaints.  Patient is a 78 y.o. female presenting with fall. The history is provided by the patient.  Fall Incident onset: at 11 AM. The fall occurred while walking. She fell from a height of 1 to 2 ft. She landed on concrete. There was no blood loss. The point of impact was the right hip. The pain is present in the right hip. The pain is moderate. She was not ambulatory at the scene. Pertinent negatives include no abdominal pain and no headaches. Associated symptoms comments: none.    Past Medical History  Diagnosis Date  . Hypertension     History reviewed. No pertinent past surgical history.  History reviewed. No pertinent family history.  History  Substance Use Topics  . Smoking status: Not on file  . Smokeless tobacco: Not on file  . Alcohol Use:     OB History    Grav Para Term Preterm Abortions TAB SAB Ect Mult Living                  Review of Systems  Constitutional: Negative for fatigue.  Cardiovascular: Negative for chest pain.  Gastrointestinal: Negative for abdominal pain.  Musculoskeletal:       Hip pain   Neurological: Negative for dizziness and headaches.  All other systems reviewed and are negative.    Allergies  Review of patient's allergies indicates no known allergies.  Home Medications  No current outpatient prescriptions on file.  BP 105/43  Pulse 68  Temp 97.5 F (36.4 C) (Oral)  Resp 14  SpO2  93%  Physical Exam  Constitutional: She is oriented to person, place, and time. She appears well-developed and well-nourished. No distress.  HENT:  Head: Normocephalic and atraumatic.  Neck: Normal range of motion. Neck supple.  Cardiovascular: Normal rate and regular rhythm.   No murmur heard. Pulmonary/Chest: Effort normal and breath sounds normal. No respiratory distress.  Abdominal: Soft. Bowel sounds are normal. She exhibits no distension. There is no tenderness.  Musculoskeletal:       The right leg is shortened and externally rotated.  There is pain with palpation of the lateral aspect of the hip.  The distal pulses, motor, and sensation are intact.  Neurological: She is alert and oriented to person, place, and time.  Skin: Skin is warm. She is not diaphoretic.    ED Course  Procedures (including critical care time)  Labs Reviewed - No data to display No results found.   No diagnosis found.   Date: 03/17/2012  Rate: 66  Rhythm: normal sinus rhythm  QRS Axis: normal  Intervals: normal  ST/T Wave abnormalities: normal  Conduction Disutrbances:none  Narrative Interpretation:   Old EKG Reviewed: none available    MDM  The patient presents after a fall.  She was found to have a displaced right femoral neck fracture.  I have spoken with Dr. August Saucer from orthopedics who would like for  medicine to admit her and he would like to try to fix her tonight depending on the schedule.  I will consult medicine for admission.          Geoffery Lyons, MD 03/17/12 1457  Geoffery Lyons, MD 03/19/12 1418  Geoffery Lyons, MD 04/15/12 1032

## 2012-03-17 NOTE — Anesthesia Postprocedure Evaluation (Signed)
  Anesthesia Post-op Note  Patient: Holly Allen  Procedure(s) Performed: Procedure(s) (LRB) with comments: ARTHROPLASTY BIPOLAR HIP (Right)  Patient Location: PACU  Anesthesia Type: Spinal  Level of Consciousness: awake  Airway and Oxygen Therapy: Patient Spontanous Breathing  Post-op Pain: mild  Post-op Assessment: Post-op Vital signs reviewed  Post-op Vital Signs: stable  Complications: No apparent anesthesia complications

## 2012-03-17 NOTE — ED Notes (Addendum)
Patient fell while walking in yard.  No LOC.  Shortly of right leg per EMS.  Given 100 mcg Fenatyl by EMS

## 2012-03-17 NOTE — Progress Notes (Signed)
ANTICOAGULATION CONSULT NOTE - Initial Consult  Pharmacy Consult for Coumadin Indication: VTE prophylaxis  No Known Allergies  Patient Measurements: Height: 5\' 4"  (162.6 cm) Weight: 115 lb (52.164 kg) IBW/kg (Calculated) : 54.7   Vital Signs: Temp: 97 F (36.1 C) (09/18 2312) Temp src: Oral (09/18 2312) BP: 106/44 mmHg (09/18 2312) Pulse Rate: 87  (09/18 2312)  Labs:  Basename 03/17/12 1321  HGB 11.8*  HCT 36.7  PLT 219  APTT 29  LABPROT 14.0  INR 1.09  HEPARINUNFRC --  CREATININE 0.75  CKTOTAL --  CKMB --  TROPONINI --   Estimated Creatinine Clearance: 35.4 ml/min (by C-G formula based on Cr of 0.75).  Medical History: Past Medical History  Diagnosis Date  . Hypertension    Medications:  Scheduled:    . aspirin EC  162 mg Oral Daily  .  ceFAZolin (ANCEF) IV  1 g Intravenous Q8H  . cholecalciferol  1,000 Units Oral Daily  . diphenhydrAMINE  25 mg Intravenous Once  . docusate sodium  100 mg Oral BID  . enoxaparin (LOVENOX) injection  30 mg Subcutaneous Daily  . metoprolol  50 mg Oral BID  .  morphine injection  4 mg Intravenous Once  . senna  1 tablet Oral BID  . DISCONTD: sodium chloride   Intravenous STAT  . DISCONTD:  morphine injection  2 mg Intravenous Once    Assessment: 78 yo female s/p orthopedic surgery for right femoral neck fracture. Pharmacy consulted to manage Coumadin. Baseline INR 1.09.   Goal of Therapy:  INR 2-3 Monitor platelets by anticoagulation protocol: Yes   Plan:  1. Coumadin 2.5mg  po x 1. 2. Daily PT / INR 3. Coumadin book / video  Emeline Gins 03/17/2012,11:21 PM

## 2012-03-17 NOTE — ED Notes (Signed)
Lab is in the room with the patient. Will do EKG when they leave

## 2012-03-18 ENCOUNTER — Encounter (HOSPITAL_COMMUNITY): Payer: Self-pay | Admitting: *Deleted

## 2012-03-18 LAB — URINALYSIS, ROUTINE W REFLEX MICROSCOPIC
Nitrite: NEGATIVE
Specific Gravity, Urine: 1.028 (ref 1.005–1.030)
Urobilinogen, UA: 0.2 mg/dL (ref 0.0–1.0)
pH: 5 (ref 5.0–8.0)

## 2012-03-18 LAB — BASIC METABOLIC PANEL
CO2: 25 mEq/L (ref 19–32)
Calcium: 8.7 mg/dL (ref 8.4–10.5)
Chloride: 103 mEq/L (ref 96–112)
Creatinine, Ser: 0.89 mg/dL (ref 0.50–1.10)
GFR calc Af Amer: 62 mL/min — ABNORMAL LOW (ref >90)
Sodium: 136 mEq/L (ref 135–145)

## 2012-03-18 LAB — CBC
HCT: 25.2 % — ABNORMAL LOW (ref 36.0–46.0)
Platelets: 192 10*3/uL (ref 150–400)
RBC: 3.21 MIL/uL — ABNORMAL LOW (ref 3.87–5.11)
RDW: 15.5 % (ref 11.5–15.5)
WBC: 10.1 10*3/uL (ref 4.0–10.5)

## 2012-03-18 LAB — URINE MICROSCOPIC-ADD ON

## 2012-03-18 LAB — PROTIME-INR: INR: 1.25 (ref 0.00–1.49)

## 2012-03-18 MED ORDER — WARFARIN SODIUM 2 MG PO TABS
2.0000 mg | ORAL_TABLET | Freq: Once | ORAL | Status: AC
  Administered 2012-03-18: 2 mg via ORAL
  Filled 2012-03-18: qty 1

## 2012-03-18 MED ORDER — CEFAZOLIN SODIUM 1-5 GM-% IV SOLN
1.0000 g | Freq: Three times a day (TID) | INTRAVENOUS | Status: AC
  Administered 2012-03-18 (×2): 1 g via INTRAVENOUS
  Filled 2012-03-18 (×2): qty 50

## 2012-03-18 MED ORDER — INFLUENZA VIRUS VACC SPLIT PF IM SUSP
0.5000 mL | INTRAMUSCULAR | Status: AC
  Administered 2012-03-19: 0.5 mL via INTRAMUSCULAR
  Filled 2012-03-18: qty 0.5

## 2012-03-18 MED ORDER — BOOST / RESOURCE BREEZE PO LIQD
1.0000 | Freq: Every day | ORAL | Status: DC
  Administered 2012-03-20 – 2012-03-23 (×3): 1 via ORAL

## 2012-03-18 MED ORDER — SODIUM CHLORIDE 0.9 % IV SOLN
INTRAVENOUS | Status: DC
  Administered 2012-03-18: 11:00:00 via INTRAVENOUS

## 2012-03-18 NOTE — Progress Notes (Signed)
Orthopedic Tech Progress Note Patient Details:  Holly Allen [birth year] [id number] Trapeze bar patient helper     Nikki Dom 03/18/2012, 5:34 PM

## 2012-03-18 NOTE — Care Management Note (Unsigned)
    Page 1 of 1   03/18/2012     4:16:36 PM   CARE MANAGEMENT NOTE 03/18/2012  Patient:  Holly Allen, Holly Allen   Account Number:  [id number]  Date Initiated:  03/18/2012  Documentation initiated by:  Anette Guarneri  Subjective/Objective Assessment:   POD#1 s/p right THA  SNF needed     Action/Plan:   SNF   Anticipated DC Date:  03/20/2012   Anticipated DC Plan:  SKILLED NURSING FACILITY  In-house referral  Clinical Social Worker      DC Planning Services  CM consult      Choice offered to / List presented to:             Status of service:  In process, will continue to follow Medicare Important Message given?   (If response is "NO", the following Medicare IM given date fields will be blank) Date Medicare IM given:   Date Additional Medicare IM given:    Discharge Disposition:    Per UR Regulation:  Reviewed for med. necessity/level of care/duration of stay  If discussed at Long Length of Stay Meetings, dates discussed:    Comments:  03/18/12  16:02 Anette Guarneri RN/CM d/c plan is SNF, daughter Holly Allen is contact for family, her cell# is [phone] and home # [phone] CSW notified.

## 2012-03-18 NOTE — Progress Notes (Signed)
INITIAL ADULT NUTRITION ASSESSMENT Date: 03/18/2012   Time: 1:59 PM Reason for Assessment: consult; hip fx  INTERVENTION: 1.  Supplements; Resource Breeze once daily.  ASSESSMENT: Female 78 y.o.  Dx: fall  Hx:  Past Medical History  Diagnosis Date  . Hypertension     Related Meds:  Scheduled Meds:   . aspirin EC  162 mg Oral Daily  .  ceFAZolin (ANCEF) IV  1 g Intravenous Q8H  . cholecalciferol  1,000 Units Oral Daily  . coumadin book  1 each Does not apply Once  . diphenhydrAMINE  25 mg Intravenous Once  . docusate sodium  100 mg Oral BID  . enoxaparin (LOVENOX) injection  30 mg Subcutaneous Daily  . influenza  inactive virus vaccine  0.5 mL Intramuscular Tomorrow-1000  . metoprolol  50 mg Oral BID  .  morphine injection  4 mg Intravenous Once  . senna  1 tablet Oral BID  . warfarin  2 mg Oral ONCE-1800  . warfarin  2.5 mg Oral Once  . warfarin  1 each Does not apply Once  . Warfarin - Pharmacist Dosing Inpatient   Does not apply q1800  . DISCONTD: sodium chloride   Intravenous STAT  . DISCONTD:  ceFAZolin (ANCEF) IV  1 g Intravenous Q8H  . DISCONTD:  morphine injection  2 mg Intravenous Once   Continuous Infusions:   . sodium chloride 75 mL/hr at 03/18/12 1116  . lactated ringers 50 mL/hr at 03/18/12 0643   PRN Meds:.HYDROcodone-acetaminophen, methocarbamol, morphine injection, oxyCODONE, DISCONTD: HYDROcodone-acetaminophen, DISCONTD:  HYDROmorphone (DILAUDID) injection, DISCONTD:  HYDROmorphone (DILAUDID) injection, DISCONTD: meperidine (DEMEROL) injection, DISCONTD:  morphine injection, DISCONTD: ondansetron (ZOFRAN) IV, DISCONTD: oxyCODONE, DISCONTD: oxyCODONE, DISCONTD: sodium chloride irrigation   Ht: 5\' 4"  (162.6 cm)  Wt: 115 lb (52.164 kg)  Ideal Wt: 120 lbs % Ideal Wt: 96%  Usual Wt: same per pt % Usual Wt: 100%  Body mass index is 19.74 kg/(m^2).  Food/Nutrition Related Hx: denies wt loss, eating per her usual PTA  Labs:  CMP     Component  Value Date/Time   NA 136 03/18/2012 0637   K 4.9 03/18/2012 0637   CL 103 03/18/2012 0637   CO2 25 03/18/2012 0637   GLUCOSE 152* 03/18/2012 0637   BUN 19 03/18/2012 0637   CREATININE 0.89 03/18/2012 0637   CALCIUM 8.7 03/18/2012 0637   PROT 6.5 03/17/2012 1321   ALBUMIN 3.2* 03/17/2012 1321   AST 19 03/17/2012 1321   ALT 9 03/17/2012 1321   ALKPHOS 105 03/17/2012 1321   BILITOT 0.4 03/17/2012 1321   GFRNONAA 54* 03/18/2012 0637   GFRAA 62* 03/18/2012 0637    CBC    Component Value Date/Time   WBC 10.1 03/18/2012 0637   RBC 3.21* 03/18/2012 0637   HGB 8.0* 03/18/2012 0637   HCT 25.2* 03/18/2012 0637   PLT 192 03/18/2012 0637   MCV 78.5 03/18/2012 0637   MCH 24.9* 03/18/2012 0637   MCHC 31.7 03/18/2012 0637   RDW 15.5 03/18/2012 0637   LYMPHSABS 1.4 03/17/2012 1321   MONOABS 1.4* 03/17/2012 1321   EOSABS 0.0 03/17/2012 1321   BASOSABS 0.0 03/17/2012 1321    Intake: NPO Output:   Intake/Output Summary (Last 24 hours) at 03/18/12 1401 Last data filed at 03/18/12 0500  Gross per 24 hour  Intake   1400 ml  Output    500 ml  Net    900 ml   No BM since admission  Diet Order: Clear Liquid  Supplements/Tube Feeding: none at this time  IVF:    sodium chloride Last Rate: 75 mL/hr at 03/18/12 1116  lactated ringers Last Rate: 50 mL/hr at 03/18/12 0643    Estimated Nutritional Needs:   Kcal: 1450-1560 kcal Protein: 63-72g Fluid: ~1.6 L/day  Pt and friend at bedside state that pt has been eating per her usual PTA and noticeable wt loss.  Pt states she was eating a regular diet PTA, she was not on any supplements. Pt denies having an appetite currently and feels it is the residuals effects of surgery/anesthesia. Discussed diet advancement process and expectations with pt and friend.  Encouraged pt to order meals per her preference once diet advanced.  Will order supplement for pt consistent with diet order.  NUTRITION DIAGNOSIS: -Increased nutrient needs (NI-5.1).  Status:  Ongoing  RELATED TO: healing  AS EVIDENCE BY: hip fracture  MONITORING/EVALUATION(Goals): 1.  Food/Beverage; diet advancement with tolerance, pt eating per her usual  EDUCATION NEEDS: -Education needs addressed   Dietitian #: [phone]  DOCUMENTATION CODES Per approved criteria  -Not Applicable    Loyce Dys Sue-Ellen 03/18/2012, 1:59 PM

## 2012-03-18 NOTE — Progress Notes (Signed)
Subjective: 1 Day Post-Op Procedure(s) (LRB): ARTHROPLASTY BIPOLAR HIP (Right) Patient reports pain as moderate.  Hearing aids out so difficulty hearing.  Acute blood loss anemia with hgb down to 8.  Objective: Vital signs in last 24 hours: Temp:  [97 F (36.1 C)-98.1 F (36.7 C)] 98.1 F (36.7 C) (09/19 0644) Pulse Rate:  [67-116] 116  (09/19 0644) Resp:  [12-26] 18  (09/19 0644) BP: (103-123)/(37-55) 110/48 mmHg (09/19 0644) SpO2:  [93 %-100 %] 97 % (09/19 0644) Weight:  [52.164 kg (115 lb)] 52.164 kg (115 lb) (09/18 2312)  Intake/Output from previous day: 09/18 0701 - 09/19 0700 In: 1400 [I.V.:1400] Out: 500 [Urine:300; Blood:200] Intake/Output this shift:     Basename 03/18/12 0637 03/17/12 1321  HGB 8.0* 11.8*    Basename 03/18/12 0637 03/17/12 1321  WBC 10.1 18.0*  RBC 3.21* 4.67  HCT 25.2* 36.7  PLT 192 219    Basename 03/18/12 0637 03/17/12 1321  NA 136 135  K 4.9 4.7  CL 103 99  CO2 25 27  BUN 19 15  CREATININE 0.89 0.75  GLUCOSE 152* 170*  CALCIUM 8.7 9.1    Basename 03/18/12 0637 03/17/12 1321  LABPT -- --  INR 1.25 1.09    Sensation intact distally Intact pulses distally Dorsiflexion/Plantar flexion intact Incision: dressing C/D/I  Assessment/Plan: 1 Day Post-Op Procedure(s) (LRB): ARTHROPLASTY BIPOLAR HIP (Right) Up with therapy, WBAT right hip, anterior precautions. Will need ST-SNF placement.  Khyla Mccumbers Y 03/18/2012, 10:26 AM

## 2012-03-18 NOTE — Progress Notes (Signed)
ANTICOAGULATION CONSULT NOTE - Follow up  Pharmacy Consult for Coumadin Indication: VTE prophylaxis  No Known Allergies  Patient Measurements: Height: 5\' 4"  (162.6 cm) Weight: 115 lb (52.164 kg) IBW/kg (Calculated) : 54.7   Vital Signs: Temp: 98.1 F (36.7 C) (09/19 0644) Temp src: Oral (09/18 2312) BP: 110/48 mmHg (09/19 0644) Pulse Rate: 116  (09/19 0644)  Labs:  Basename 03/18/12 0637 03/17/12 1321  HGB 8.0* 11.8*  HCT 25.2* 36.7  PLT 192 219  APTT -- 29  LABPROT 15.5* 14.0  INR 1.25 1.09  HEPARINUNFRC -- --  CREATININE 0.89 0.75  CKTOTAL -- --  CKMB -- --  TROPONINI -- --   Estimated Creatinine Clearance: 31.9 ml/min (by C-G formula based on Cr of 0.89).  Medical History: Past Medical History  Diagnosis Date  . Hypertension    Medications:  Scheduled:     . aspirin EC  162 mg Oral Daily  .  ceFAZolin (ANCEF) IV  1 g Intravenous Q8H  . cholecalciferol  1,000 Units Oral Daily  . coumadin book  1 each Does not apply Once  . diphenhydrAMINE  25 mg Intravenous Once  . docusate sodium  100 mg Oral BID  . enoxaparin (LOVENOX) injection  30 mg Subcutaneous Daily  . influenza  inactive virus vaccine  0.5 mL Intramuscular Tomorrow-1000  . metoprolol  50 mg Oral BID  .  morphine injection  4 mg Intravenous Once  . senna  1 tablet Oral BID  . warfarin  2.5 mg Oral Once  . warfarin  1 each Does not apply Once  . Warfarin - Pharmacist Dosing Inpatient   Does not apply q1800  . DISCONTD: sodium chloride   Intravenous STAT  . DISCONTD:  ceFAZolin (ANCEF) IV  1 g Intravenous Q8H  . DISCONTD:  morphine injection  2 mg Intravenous Once    Assessment: 78 yo female patient s/p orthopedic surgery for right femoral neck fracture. Pharmacy consulted to manage Coumadin for dvt px. INR increase some today, will decrease dose. May have low coumadin requirements d/t advanced age and weight.   Goal of Therapy:  INR 2-3 Monitor platelets by anticoagulation protocol: Yes     Plan:  1. Coumadin 2mg  today 2. Daily PT / INR  Verlene Mayer, PharmD, [place] Pager [phone] 03/18/2012,11:04 AM

## 2012-03-18 NOTE — Progress Notes (Signed)
[facility] PROGRESS NOTE  Holly Allen ZOX:[id number] DOB: 11-04-1914 DOA: 03/17/2012 PCP: Sheila Oats, MD  Assessment/Plan: Right femoral neck fracture  -Secondary to mechanical fall  -s/p right hip arthroplasty  -Patient will need DVT prophylaxis postoperatively x35 days  -PT/OT evaluation -likely needs SNF Hypertension  -Continue home dose of metoprolol tartrate  History of stroke, ischemic cerebellum (2007)  -Continue aspirin postop  Leukocytosis  -Likely reactive due to recent events  -improved today Anemia -check iron studies -check guiac   Procedures/Studies: Dg Chest 1 View  03/17/2012  *RADIOLOGY REPORT*  Clinical Data: Right hip fracture, fall  CHEST - 1 VIEW  Comparison: 04/10/2010  Findings: Upper normal heart size. Calcified tortuous aorta. Enlarged central pulmonary arteries, likely accounting for bilateral hilar prominence, greater on the right. Lungs appear emphysematous with chronic accentuation of interstitial markings at mid-to-lower lungs. No definite infiltrate, pleural effusion or pneumothorax. Marked osseous demineralization. Stable sclerotic focus in a mid thoracic vertebra.  IMPRESSION: Emphysematous and chronic interstitial lung disease changes. Question pulmonary arterial hypertension. No acute abnormalities.   Original Report Authenticated By: Lollie Marrow, M.D.    Dg Hip Complete Right  03/17/2012  *RADIOLOGY REPORT*  Clinical Data: Right hip pain post fall  RIGHT HIP - COMPLETE 2+ VIEW  Comparison: None  Findings: Osseous demineralization. Displaced right femoral neck fracture with overriding. No dislocation or additional fractures identified. Mild narrowing of left hip joint with preservation of right hip joint space. Pelvis appears intact.  IMPRESSION: Displaced right femoral neck fracture.   Original Report Authenticated By: Lollie Marrow, M.D.    Dg Pelvis Portable  03/17/2012  *RADIOLOGY REPORT*  Clinical Data: Hip replacement.   PORTABLE PELVIS  Comparison: Plain films 03/17/2012 at 1357 hours.  Findings: The patient has a new bipolar right hip hemiarthroplasty with three cerclage wires.  The device is located.  No acute fracture is seen.  Surgical staples and gas in the soft tissues noted.  IMPRESSION: Right hip replacement without evidence of complication.   Original Report Authenticated By: Bernadene Bell. D'ALESSIO, M.D.    Dg Hip Portable 1 View Right  03/17/2012  *RADIOLOGY REPORT*  Clinical Data: Hip replacement.  PORTABLE RIGHT HIP - 1 VIEW  Comparison: Plain films 03/17/2012 1357 hours.  Findings: New bipolar right hip hemiarthroplasty with 3 cerclage wire is in place.  Device is located and there is no fracture.  Gas in the soft tissues and surgical staples noted.  IMPRESSION: Right hip replacement without evidence of complication.   Original Report Authenticated By: Bernadene Bell. Maricela Curet, M.D.         Code Status: Full  Disposition Plan: Home when medically stable  Subjective: Patient denies fevers, chills, chest pain, shortness breath, nausea, vomiting, diarrhea, abdominal pain.  Objective: Filed Vitals:   03/17/12 2312 03/18/12 0210 03/18/12 0644 03/18/12 1400  BP: 106/44 112/51 110/48 122/45  Pulse: 87 90 116 108  Temp: 97 F (36.1 C) 97.1 F (36.2 C) 98.1 F (36.7 C) 98.7 F (37.1 C)  TempSrc: Oral     Resp:  16 18 16   Height: 5\' 4"  (1.626 m)     Weight: 52.164 kg (115 lb)     SpO2: 100% 93% 97% 95%    Intake/Output Summary (Last 24 hours) at 03/18/12 2032 Last data filed at 03/18/12 1800  Gross per 24 hour  Intake   1145 ml  Output    650 ml  Net    495 ml   Weight change:  Exam:  General:  Pt is alert, follows commands appropriately, not in acute distress  HEENT: No icterus,, [place]/AT  Cardiovascular: Regular rate and rhythm, S1/S2, no rubs, no gallops  Respiratory: Clear to auscultation bilaterally, no wheezing, no crackles, no rhonchi  Abdomen: Soft, non tender, non distended,  bowel sounds present, no guarding  Extremities: No edema, No lymphangitis, No petechiae, No rashes, no synovitis  Data Reviewed: Basic Metabolic Panel:  Lab 03/18/12 1610 03/17/12 1321  NA 136 135  K 4.9 4.7  CL 103 99  CO2 25 27  GLUCOSE 152* 170*  BUN 19 15  CREATININE 0.89 0.75  CALCIUM 8.7 9.1  MG -- --  PHOS -- --   Liver Function Tests:  Lab 03/17/12 1321  AST 19  ALT 9  ALKPHOS 105  BILITOT 0.4  PROT 6.5  ALBUMIN 3.2*   No results found for this basename: LIPASE:5,AMYLASE:5 in the last 168 hours No results found for this basename: AMMONIA:5 in the last 168 hours CBC:  Lab 03/18/12 0637 03/17/12 1321  WBC 10.1 18.0*  NEUTROABS -- 15.2*  HGB 8.0* 11.8*  HCT 25.2* 36.7  MCV 78.5 78.6  PLT 192 219   Cardiac Enzymes: No results found for this basename: CKTOTAL:5,CKMB:5,CKMBINDEX:5,TROPONINI:5 in the last 168 hours BNP: No components found with this basename: POCBNP:5 CBG: No results found for this basename: GLUCAP:5 in the last 168 hours  No results found for this or any previous visit (from the past 240 hour(s)).   Scheduled Meds:   . aspirin EC  162 mg Oral Daily  .  ceFAZolin (ANCEF) IV  1 g Intravenous Q8H  . cholecalciferol  1,000 Units Oral Daily  . coumadin book  1 each Does not apply Once  . docusate sodium  100 mg Oral BID  . enoxaparin (LOVENOX) injection  30 mg Subcutaneous Daily  . feeding supplement  1 Container Oral Q1400  . influenza  inactive virus vaccine  0.5 mL Intramuscular Tomorrow-1000  . metoprolol  50 mg Oral BID  . senna  1 tablet Oral BID  . warfarin  2 mg Oral ONCE-1800  . warfarin  2.5 mg Oral Once  . warfarin  1 each Does not apply Once  . Warfarin - Pharmacist Dosing Inpatient   Does not apply q1800  . DISCONTD: sodium chloride   Intravenous STAT  . DISCONTD:  ceFAZolin (ANCEF) IV  1 g Intravenous Q8H   Continuous Infusions:   . sodium chloride 75 mL/hr at 03/18/12 1116  . lactated ringers 50 mL/hr at 03/18/12  0643     Sneijder Bernards, DO  [facility] Pager [phone]  If 7PM-7AM, please contact night-coverage [email or web address] Password [email or web address] 03/18/2012, 8:32 PM   LOS: 1 day

## 2012-03-18 NOTE — Progress Notes (Signed)
Physical Therapy Evaluation Patient Details Name: Holly Allen MRN: [id number] DOB: May 01, 1915 Today's Date: 03/18/2012 Time: 7829-5621 PT Time Calculation (min): 20 min  PT Assessment / Plan / Recommendation Clinical Impression  Pt is a 78 y/o female s/p R THA secondary to femoral neck fracture from fall. Pt will benefit from continued pt in acute setting and in SNF to maximize pt functional mobility.      PT Assessment  Patient needs continued PT services    Follow Up Recommendations  Skilled nursing facility;Supervision/Assistance - 24 hour    Barriers to Discharge   Caregiver unable to provide the level of care pt currently needs.     Equipment Recommendations  None recommended by PT    Recommendations for Other Services OT consult   Frequency Min 3X/week    Precautions / Restrictions Precautions Precautions: Anterior Hip Precaution Booklet Issued: Yes (comment) Restrictions Weight Bearing Restrictions: Yes LLE Weight Bearing: Weight bearing as tolerated   Pertinent Vitals/Pain Pt c/o pain in R hip unable to rate. RN notified.      Mobility  Bed Mobility Bed Mobility: Supine to Sit;Sit to Supine;Sitting - Scoot to Edge of Bed;Scooting to HOB Supine to Sit: 1: +2 Total assist;HOB flat;With rails Supine to Sit: Patient Percentage: 20% Sitting - Scoot to Edge of Bed: 1: +2 Total assist;With rail Sitting - Scoot to [place] of Bed: Patient Percentage: 20% Sit to Supine: 1: +2 Total assist;HOB flat Sit to Supine: Patient Percentage: 10% Scooting to HOB: 1: +2 Total assist Scooting to [facility]: Patient Percentage: 20% Details for Bed Mobility Assistance: Total assist for all aspects of bed mobility secondary to pain in hip. Pt unable to tolerate activity.  Pt required assist for bilateral LEs as well as trunk.   Transfers Transfers: Sit to Stand;Stand to Sit Sit to Stand: 1: +2 Total assist;From bed;With upper extremity assist Sit to Stand: Patient Percentage: 10% Stand to  Sit: 1: +2 Total assist;To bed;With upper extremity assist Stand to Sit: Patient Percentage: 20% Details for Transfer Assistance: Total assist to initiate sit to stand transfer.  Pt unable to extend hips in standing, leaning back against bed and pushing forward on the walker. Pt avoids wt bearing in R LE, but was able to place some weight through it when PT manually shifted pt's weight forward while blocking feet to prevent sliding.  Ambulation/Gait Ambulation/Gait Assistance: Not tested (comment) Stairs: No Wheelchair Mobility Wheelchair Mobility: No    Exercises     PT Diagnosis: Difficulty walking;Generalized weakness;Acute pain;Altered mental status  PT Problem List: Decreased strength;Decreased range of motion;Decreased activity tolerance;Decreased balance;Decreased mobility;Decreased coordination;Decreased knowledge of precautions;Pain;Decreased cognition;Decreased knowledge of use of DME;Decreased safety awareness PT Treatment Interventions: Gait training;DME instruction;Functional mobility training;Therapeutic activities;Patient/family education;Balance training;Therapeutic exercise;Neuromuscular re-education   PT Goals Acute Rehab PT Goals PT Goal Formulation: With patient Time For Goal Achievement: 04/01/12 Potential to Achieve Goals: Fair Pt will go Supine/Side to Sit: with min assist PT Goal: Supine/Side to Sit - Progress: Goal set today Pt will Sit at Edge of Bed: with min assist;3-5 min;with bilateral upper extremity support PT Goal: Sit at Edge Of Bed - Progress: Goal set today Pt will go Sit to Supine/Side: with mod assist;with HOB 0 degrees PT Goal: Sit to Supine/Side - Progress: Goal set today Pt will go Sit to Stand: with mod assist PT Goal: Sit to Stand - Progress: Goal set today Pt will go Stand to Sit: with mod assist PT Goal: Stand to Sit - Progress: Goal set  today Pt will Transfer Bed to Chair/Chair to Bed: with mod assist PT Transfer Goal: Bed to Chair/Chair to  Bed - Progress: Goal set today Pt will Ambulate: 1 - 15 feet;with rolling walker PT Goal: Ambulate - Progress: Goal set today  Visit Information  Last PT Received On: 03/18/12 Assistance Needed: +2    Subjective Data      Prior Functioning  Home Living Lives With: Son Available Help at Discharge: Skilled Nursing Facility Prior Function Level of Independence: Independent with assistive device(s) Able to Take Stairs?: Yes Driving: No Communication Communication: HOH    Cognition  Overall Cognitive Status: Impaired Area of Impairment: Attention;Memory;Problem solving Arousal/Alertness: Lethargic Orientation Level: Appears intact for tasks assessed Behavior During Session: [facility] for tasks performed Current Attention Level: Focused Memory: Decreased recall of precautions    Extremity/Trunk Assessment Right Lower Extremity Assessment RLE ROM/Strength/Tone: Deficits;Due to pain;Due to precautions RLE ROM/Strength/Tone Deficits: ROM and strength limited by pain.  Left Lower Extremity Assessment LLE ROM/Strength/Tone: Deficits;Unable to fully assess LLE ROM/Strength/Tone Deficits: ROM and strength limited by pain in R hip with movement of L hip.    Balance Balance Balance Assessed: Yes Static Sitting Balance Static Sitting - Balance Support: Bilateral upper extremity supported;Feet supported Static Sitting - Level of Assistance: 3: Mod assist Static Sitting - Comment/# of Minutes: Pt sat on EOB for 3-4 minutes with repeated assist to prevent posterior lean.  Pt c/o increased pain in R hip in sitting, PT avoids wt bearing on R hip in sitting.    End of Session PT - End of Session Equipment Utilized During Treatment: Gait belt Activity Tolerance: Patient limited by fatigue;Patient limited by pain Patient left: in bed;with call bell/phone within reach;with bed alarm set Nurse Communication: Mobility status  GP     Sesilia Poucher 03/18/2012, 5:48 PM Leidy Massar L. [place] DPT [phone]

## 2012-03-18 NOTE — Progress Notes (Signed)
UR COMPLETED  

## 2012-03-18 NOTE — Op Note (Signed)
NAMECHYENNE, SOBCZAK NO.:  [id number]  MEDICAL RECORD NO.:  [id number]  LOCATION:  [facility]                         FACILITY:  [facility]  PHYSICIAN:  Vanita Panda. Magnus Ivan, M.D.DATE OF BIRTH:  1914-12-28  DATE OF PROCEDURE:  03/17/2012 DATE OF DISCHARGE:                              OPERATIVE REPORT   PREOPERATIVE DIAGNOSIS:  Right hip displaced femoral neck fracture.  POSTOPERATIVE DIAGNOSIS:  Right hip displaced femoral neck fracture.  PROCEDURE:  Open reduction and internal fixation of right hip femoral neck fracture using an unipolar hemiarthroplasty.  IMPLANTS:  DePuy Summit Basic press-fit stem size 4, size 47 unipolar head with -3 spacer, 3 Dall-Miles cables due to crack in the proximal lateral femur.  SURGEON:  Doneen Poisson, MD  ANESTHESIA:  Spinal.  BLOOD LOSS:  200 mL.  ANTIBIOTICS:  2 g IV Ancef.  COMPLICATIONS:  None.  INDICATIONS:  Ms. Cihlar is a 78 year old female, who will be 89 in another week.  She had a mechanical fall in her yard today and actually crawled back to the house after landing on right hip and had been inability to ambulate.  She was found in her house with pain with right hip and inability to ambulate.  She was brought to the [facility] ER by EMS and was found to have a displaced femoral neck fracture.  She does not have significant medical problems.  She was seen by the [facility] hospitalist, who cleared her for surgery.  I talked to her and her family at length and they did wish to proceed with surgery as well.  The risks and benefits of this were explained in detail and documented and they do wish to proceed to the OR.  PROCEDURE DESCRIPTION:  After informed consent was obtained, appropriate right hip was marked.  She was brought to the operating room.  While she is on her stretcher, anesthesia was obtained through spinal anesthesia. She was then placed supine on the operating table and a Foley catheter was placed.   We then turned her into lateral decubitus position with the right operative hip up.  Hip positioners were placed in the front and the back of the hip and the hip was prepped and draped with DuraPrep and sterile drapes and then a sterile stockinette.  A time-out was called, and she was identified as correct patient, correct right hip.  I then made a lateral incision directly over the greater trochanter and carried this proximally and distally.  I dissected down to the iliotibial band. The iliotibial band was divided longitudinally.  I then proceeded with an anterolateral approach to the hip.  I took down a small portion of the sleeve of the gluteus medius and minimus tendons off the greater trochanter and reflected these anteriorly.  I put these within Charnley retractor, and then divided the hip capsule and found the hematoma.  I then made my femoral neck cut proximal to the lesser trochanter, used a corkscrew guide and removed the femoral head in its entirety.  We measured this to be an appropriate fit of 47 head.  Next, attention was turned to the femur with the hip flexed and externally rotated and brought off  the front of the table in a leg bag, I gained access to the femoral canal using initiating reamer and then the canal finder.  I used the lateralizing reamer, began broaching from a size 1 broach up to a size 4.  With a size 4 broach in place, I placed a standard neck and a - 3 spacer with 47 ball and I tried to reduce this into this acetabulum, but I did not clear the soft tissue __________ crack in the lateral wall of the femur.  This did not propagate down floor and once I got the stem down, I was able to trial head and neck and it was stable with stable leg lengths.  I still felt it was appropriate to at least place the cable.  I did then take out the trial components and I placed the real Summit Basic press-fit femoral component size 4.  Once we did that, I was able to place 3  cables around the femur just to protect the lateral wall and again the calcar was very well preserved with no crack propagating the calcar.  The stem was then again solid filled and I did not feel it moving and also placed a real 47 head with -3 tapered neck and reduced this into the acetabulum, it was stable with equal leg lengths.  I then copiously irrigated the soft tissues with normal saline solution.  I closed the joint capsule with interrupted #1 Ethibond suture.  I reapproximated the gluteus medius and minimus to the greater trochanter using #1 Ethibond suture, #1 Vicryl was used to close the IT band, 2-0 Vicryl in subcutaneous tissue, and staples on the skin.  Well- padded sterile dressing was applied.  She was rolled into a supine position on her stretcher and her leg lengths again were equal.  She was taken to recovery room in stable condition.  All final counts correct. There were no complications noted.     Vanita Panda. Magnus Ivan, M.D.     CYB/MEDQ  D:  03/17/2012  T:  03/18/2012  Job:  [id number]

## 2012-03-19 ENCOUNTER — Inpatient Hospital Stay (HOSPITAL_COMMUNITY): Payer: Medicare Other

## 2012-03-19 ENCOUNTER — Encounter (HOSPITAL_COMMUNITY): Payer: Self-pay | Admitting: Orthopaedic Surgery

## 2012-03-19 DIAGNOSIS — J449 Chronic obstructive pulmonary disease, unspecified: Secondary | ICD-10-CM

## 2012-03-19 DIAGNOSIS — D649 Anemia, unspecified: Secondary | ICD-10-CM

## 2012-03-19 LAB — BASIC METABOLIC PANEL
CO2: 25 mEq/L (ref 19–32)
Chloride: 98 mEq/L (ref 96–112)
Glucose, Bld: 138 mg/dL — ABNORMAL HIGH (ref 70–99)
Potassium: 4.4 mEq/L (ref 3.5–5.1)
Sodium: 131 mEq/L — ABNORMAL LOW (ref 135–145)

## 2012-03-19 LAB — PREPARE RBC (CROSSMATCH)

## 2012-03-19 LAB — CBC
Hemoglobin: 7 g/dL — ABNORMAL LOW (ref 12.0–15.0)
MCH: 25.4 pg — ABNORMAL LOW (ref 26.0–34.0)
Platelets: 162 10*3/uL (ref 150–400)
RBC: 2.76 MIL/uL — ABNORMAL LOW (ref 3.87–5.11)
WBC: 8.1 10*3/uL (ref 4.0–10.5)

## 2012-03-19 LAB — PROTIME-INR
INR: 1.41 (ref 0.00–1.49)
Prothrombin Time: 16.9 seconds — ABNORMAL HIGH (ref 11.6–15.2)

## 2012-03-19 LAB — ABO/RH: ABO/RH(D): O POS

## 2012-03-19 LAB — IRON AND TIBC: Iron: <10 ug/dL — ABNORMAL LOW (ref 42–135)

## 2012-03-19 LAB — FERRITIN: Ferritin: 29 ng/mL (ref 10–291)

## 2012-03-19 MED ORDER — ACETAMINOPHEN 325 MG PO TABS
650.0000 mg | ORAL_TABLET | Freq: Once | ORAL | Status: AC
  Administered 2012-03-19: 650 mg via ORAL
  Filled 2012-03-19: qty 2

## 2012-03-19 MED ORDER — DEXTROSE 5 % IV SOLN
1.0000 g | INTRAVENOUS | Status: DC
  Administered 2012-03-19 – 2012-03-20 (×2): 1 g via INTRAVENOUS
  Filled 2012-03-19 (×2): qty 10

## 2012-03-19 MED ORDER — AZITHROMYCIN 500 MG PO TABS
500.0000 mg | ORAL_TABLET | Freq: Every day | ORAL | Status: DC
  Administered 2012-03-19 – 2012-03-24 (×6): 500 mg via ORAL
  Filled 2012-03-19 (×6): qty 1

## 2012-03-19 MED ORDER — WARFARIN SODIUM 2 MG PO TABS
2.0000 mg | ORAL_TABLET | Freq: Once | ORAL | Status: AC
  Administered 2012-03-19: 2 mg via ORAL
  Filled 2012-03-19: qty 1

## 2012-03-19 NOTE — Progress Notes (Signed)
NOTIFIED LAB AND PHLEBOTOMY OF [facility] BEING CANCELLED.

## 2012-03-19 NOTE — Progress Notes (Signed)
ANTICOAGULATION CONSULT NOTE - Follow up  Pharmacy Consult for Coumadin Indication: VTE prophylaxis  No Known Allergies  Patient Measurements: Height: 5\' 4"  (162.6 cm) Weight: 115 lb (52.164 kg) IBW/kg (Calculated) : 54.7   Vital Signs: Temp: 97.5 F (36.4 C) (09/20 0533) BP: 122/48 mmHg (09/20 0533) Pulse Rate: 100  (09/20 0533)  Labs:  Basename 03/19/12 0610 03/18/12 0637 03/17/12 1321  HGB 7.0* 8.0* --  HCT 21.5* 25.2* 36.7  PLT 162 192 219  APTT -- -- 29  LABPROT 16.9* 15.5* 14.0  INR 1.41 1.25 1.09  HEPARINUNFRC -- -- --  CREATININE 0.89 0.89 0.75  CKTOTAL -- -- --  CKMB -- -- --  TROPONINI -- -- --   Estimated Creatinine Clearance: 31.9 ml/min (by C-G formula based on Cr of 0.89).  Medical History: Past Medical History  Diagnosis Date  . Hypertension    Medications:  Scheduled:     . acetaminophen  650 mg Oral Once  . aspirin EC  162 mg Oral Daily  .  ceFAZolin (ANCEF) IV  1 g Intravenous Q8H  . cholecalciferol  1,000 Units Oral Daily  . coumadin book  1 each Does not apply Once  . docusate sodium  100 mg Oral BID  . enoxaparin (LOVENOX) injection  30 mg Subcutaneous Daily  . feeding supplement  1 Container Oral Q1400  . influenza  inactive virus vaccine  0.5 mL Intramuscular Tomorrow-1000  . metoprolol  50 mg Oral BID  . senna  1 tablet Oral BID  . warfarin  2 mg Oral ONCE-1800  . warfarin  1 each Does not apply Once  . Warfarin - Pharmacist Dosing Inpatient   Does not apply q1800    Assessment: 78 yo female patient s/p orthopedic surgery for right femoral neck fracture. Pharmacy consulted to manage Coumadin for dvt px. INR trending up. May have low coumadin requirements d/t advanced age and weight.   Goal of Therapy:  INR 2-3 Monitor platelets by anticoagulation protocol: Yes   Plan:  1. Repeat Coumadin 2mg  today 2. Daily PT / INR  Verlene Mayer, PharmD, BCPS Pager [phone] 03/19/2012,12:30 PM

## 2012-03-19 NOTE — Progress Notes (Signed)
[facility] PROGRESS NOTE  Holly Allen ZOX:[id number] DOB: 1914/09/18 DOA: 03/17/2012 PCP: Sheila Oats, MD  Assessment/Plan:  Right femoral neck fracture  -Secondary to mechanical fall  -s/p right hip arthroplasty September 18 -DVT prophylaxis postoperatively x35 days, per ortho -PT/OT evaluation  -SNF will be available Monday according to social work Hypertension  -Continue home dose of metoprolol tartrate  History of stroke, ischemic cerebellum (2007)  -Continue aspirin postop  Leukocytosis  -Likely reactive due to recent events  -improved  Anemia  -Followup iron studies  -check guiac -Transfuse 2 units packed red blood cells Tachycardia -Check EKG -Patient is asymptomatic -May be related to drop in hemoglobin -Chest x-ray    Procedures/Studies: Dg Chest 1 View  03/17/2012  *RADIOLOGY REPORT*  Clinical Data: Right hip fracture, fall  CHEST - 1 VIEW  Comparison: 04/10/2010  Findings: Upper normal heart size. Calcified tortuous aorta. Enlarged central pulmonary arteries, likely accounting for bilateral hilar prominence, greater on the right. Lungs appear emphysematous with chronic accentuation of interstitial markings at mid-to-lower lungs. No definite infiltrate, pleural effusion or pneumothorax. Marked osseous demineralization. Stable sclerotic focus in a mid thoracic vertebra.  IMPRESSION: Emphysematous and chronic interstitial lung disease changes. Question pulmonary arterial hypertension. No acute abnormalities.   Original Report Authenticated By: Lollie Marrow, M.D.    Dg Hip Complete Right  03/17/2012  *RADIOLOGY REPORT*  Clinical Data: Right hip pain post fall  RIGHT HIP - COMPLETE 2+ VIEW  Comparison: None  Findings: Osseous demineralization. Displaced right femoral neck fracture with overriding. No dislocation or additional fractures identified. Mild narrowing of left hip joint with preservation of right hip joint space. Pelvis appears intact.  IMPRESSION:  Displaced right femoral neck fracture.   Original Report Authenticated By: Lollie Marrow, M.D.    Dg Pelvis Portable  03/17/2012  *RADIOLOGY REPORT*  Clinical Data: Hip replacement.  PORTABLE PELVIS  Comparison: Plain films 03/17/2012 at 1357 hours.  Findings: The patient has a new bipolar right hip hemiarthroplasty with three cerclage wires.  The device is located.  No acute fracture is seen.  Surgical staples and gas in the soft tissues noted.  IMPRESSION: Right hip replacement without evidence of complication.   Original Report Authenticated By: Bernadene Bell. D'ALESSIO, M.D.    Dg Hip Portable 1 View Right  03/17/2012  *RADIOLOGY REPORT*  Clinical Data: Hip replacement.  PORTABLE RIGHT HIP - 1 VIEW  Comparison: Plain films 03/17/2012 1357 hours.  Findings: New bipolar right hip hemiarthroplasty with 3 cerclage wire is in place.  Device is located and there is no fracture.  Gas in the soft tissues and surgical staples noted.  IMPRESSION: Right hip replacement without evidence of complication.   Original Report Authenticated By: Bernadene Bell. Maricela Curet, M.D.       Code Status: Full Family Communication: son at bedside, updated Disposition Plan: SNF when medically stable  Subjective: Patient is feeling well this morning. She denies any chest pain, shortness of breath, nausea, vomiting, diarrhea, abdominal pain, dizziness. Patient's son is at the bedside, and he states that patient has a diagnosis of COPD and she has refused to wear oxygen in the past when offered by her primary care provider.  Objective: Filed Vitals:   03/19/12 0000 03/19/12 0400 03/19/12 0533 03/19/12 0800  BP:   122/48   Pulse:   100   Temp:   97.5 F (36.4 C)   TempSrc:      Resp: 16 16 16 20   Height:  Weight:      SpO2: 95% 96% 91% 93%    Intake/Output Summary (Last 24 hours) at 03/19/12 1020 Last data filed at 03/19/12 0535  Gross per 24 hour  Intake    745 ml  Output    650 ml  Net     95 ml   Weight change:   Exam:   General:  Pt is alert, follows commands appropriately, not in acute distress  HEENT: No icterus, No thrush,, [place]/AT  Cardiovascular: Regular rate and rhythm, S1/S2, no rubs, no gallops  Respiratory: Fine basilar crackles. Good air movement. No rhonchi.  Abdomen: Soft, non tender, non distended, bowel sounds present, no guarding  Extremities: No edema, No lymphangitis, No petechiae, No rashes, no synovitis  Data Reviewed: Basic Metabolic Panel:  Lab 03/19/12 1610 03/18/12 0637 03/17/12 1321  NA 131* 136 135  K 4.4 4.9 4.7  CL 98 103 99  CO2 25 25 27   GLUCOSE 138* 152* 170*  BUN 25* 19 15  CREATININE 0.89 0.89 0.75  CALCIUM 8.6 8.7 9.1  MG -- -- --  PHOS -- -- --   Liver Function Tests:  Lab 03/17/12 1321  AST 19  ALT 9  ALKPHOS 105  BILITOT 0.4  PROT 6.5  ALBUMIN 3.2*   No results found for this basename: LIPASE:5,AMYLASE:5 in the last 168 hours No results found for this basename: AMMONIA:5 in the last 168 hours CBC:  Lab 03/19/12 0610 03/18/12 0637 03/17/12 1321  WBC 8.1 10.1 18.0*  NEUTROABS -- -- 15.2*  HGB 7.0* 8.0* 11.8*  HCT 21.5* 25.2* 36.7  MCV 77.9* 78.5 78.6  PLT 162 192 219   Cardiac Enzymes: No results found for this basename: CKTOTAL:5,CKMB:5,CKMBINDEX:5,TROPONINI:5 in the last 168 hours BNP: No components found with this basename: POCBNP:5 CBG: No results found for this basename: GLUCAP:5 in the last 168 hours  No results found for this or any previous visit (from the past 240 hour(s)).   Scheduled Meds:   . acetaminophen  650 mg Oral Once  . aspirin EC  162 mg Oral Daily  .  ceFAZolin (ANCEF) IV  1 g Intravenous Q8H  . cholecalciferol  1,000 Units Oral Daily  . coumadin book  1 each Does not apply Once  . docusate sodium  100 mg Oral BID  . enoxaparin (LOVENOX) injection  30 mg Subcutaneous Daily  . feeding supplement  1 Container Oral Q1400  . influenza  inactive virus vaccine  0.5 mL Intramuscular Tomorrow-1000  .  metoprolol  50 mg Oral BID  . senna  1 tablet Oral BID  . warfarin  2 mg Oral ONCE-1800  . warfarin  1 each Does not apply Once  . Warfarin - Pharmacist Dosing Inpatient   Does not apply q1800   Continuous Infusions:   . sodium chloride 75 mL/hr at 03/18/12 1116  . lactated ringers 50 mL/hr at 03/18/12 0643     Gerri Acre, DO  [facility] Pager [phone]  If 7PM-7AM, please contact night-coverage [email or web address] Password [email or web address] 03/19/2012, 10:20 AM   LOS: 2 days

## 2012-03-19 NOTE — Progress Notes (Signed)
Subjective: 2 Days Post-Op Procedure(s) (LRB): ARTHROPLASTY BIPOLAR HIP (Right) Patient reports pain as moderate.  Receiving transfusion due to acute blood loss anemia.   Objective: Vital signs in last 24 hours: Temp:  [97.5 F (36.4 C)-99.2 F (37.3 C)] 97.7 F (36.5 C) (09/20 1615) Pulse Rate:  [77-118] 77  (09/20 1615) Resp:  [13-20] 13  (09/20 1615) BP: (89-122)/(28-49) 95/35 mmHg (09/20 1615) SpO2:  [91 %-99 %] 99 % (09/20 1600)  Intake/Output from previous day: 09/19 0701 - 09/20 0700 In: 745 [P.O.:240; I.V.:505] Out: 650 [Urine:650] Intake/Output this shift: Total I/O In: 872.5 [P.O.:360; I.V.:500; Blood:12.5] Out: 300 [Urine:300]   Basename 03/19/12 0610 03/18/12 0637 03/17/12 1321  HGB 7.0* 8.0* 11.8*    Basename 03/19/12 0610 03/18/12 0637  WBC 8.1 10.1  RBC 2.76* 3.21*  HCT 21.5* 25.2*  PLT 162 192    Basename 03/19/12 0610 03/18/12 0637  NA 131* 136  K 4.4 4.9  CL 98 103  CO2 25 25  BUN 25* 19  CREATININE 0.89 0.89  GLUCOSE 138* 152*  CALCIUM 8.6 8.7    Basename 03/19/12 0610 03/18/12 0637  LABPT -- --  INR 1.41 1.25    Sensation intact distally Intact pulses distally Dorsiflexion/Plantar flexion intact Incision: dressing C/D/I Leg lengths equal  Assessment/Plan: 2 Days Post-Op Procedure(s) (LRB): ARTHROPLASTY BIPOLAR HIP (Right) Up with therapy Discharge to SNF likely monday  Holly Allen 03/19/2012, 4:25 PM

## 2012-03-19 NOTE — Progress Notes (Signed)
OT Cancellation Note  Treatment cancelled today due to medical issues with patient which prohibited therapy.  Pt's HGB currently 7.0.  Will check back tomorrow to see if she has an appropriate level to participate in therapies.  Macguire Holsinger Pager number [phone]  03/19/2012, 2:58 PM

## 2012-03-19 NOTE — Progress Notes (Signed)
CALLED DR. TAT AND NOTIFIED HIM OF PHLEBOTOMY RULE NOT TO DRAW [facility] UNTIL 2HR AFTER BLOOD TRANSFUSION.  HE STATES, "CANCEL [facility] AND GIVE ABX".  LAB ALREADY PULLED REQUISITION WILL CALL AND NOTIFY THEM.

## 2012-03-20 DIAGNOSIS — J189 Pneumonia, unspecified organism: Secondary | ICD-10-CM

## 2012-03-20 LAB — TYPE AND SCREEN
Antibody Screen: NEGATIVE
Unit division: 0

## 2012-03-20 LAB — CBC
Hemoglobin: 8.8 g/dL — ABNORMAL LOW (ref 12.0–15.0)
MCH: 26.7 pg (ref 26.0–34.0)
Platelets: 137 10*3/uL — ABNORMAL LOW (ref 150–400)
RBC: 3.29 MIL/uL — ABNORMAL LOW (ref 3.87–5.11)
WBC: 6.3 10*3/uL (ref 4.0–10.5)

## 2012-03-20 LAB — BASIC METABOLIC PANEL
CO2: 26 mEq/L (ref 19–32)
Calcium: 8.4 mg/dL (ref 8.4–10.5)
Glucose, Bld: 114 mg/dL — ABNORMAL HIGH (ref 70–99)
Potassium: 4.2 mEq/L (ref 3.5–5.1)
Sodium: 135 mEq/L (ref 135–145)

## 2012-03-20 LAB — PROTIME-INR
INR: 1.54 — ABNORMAL HIGH (ref 0.00–1.49)
Prothrombin Time: 18 seconds — ABNORMAL HIGH (ref 11.6–15.2)

## 2012-03-20 MED ORDER — DEXTROSE 5 % IV SOLN
1.0000 g | INTRAVENOUS | Status: DC
  Filled 2012-03-20: qty 10

## 2012-03-20 MED ORDER — WARFARIN SODIUM 2 MG PO TABS
2.0000 mg | ORAL_TABLET | Freq: Once | ORAL | Status: AC
  Administered 2012-03-20: 2 mg via ORAL
  Filled 2012-03-20: qty 1

## 2012-03-20 MED ORDER — DEXTROSE 5 % IV SOLN
1.0000 g | INTRAVENOUS | Status: DC
  Administered 2012-03-21 – 2012-03-23 (×3): 1 g via INTRAVENOUS
  Filled 2012-03-20 (×4): qty 10

## 2012-03-20 NOTE — Progress Notes (Signed)
Occupational Therapy Evaluation Patient Details Name: Holly Allen MRN: [id number] DOB: 08/30/1914 Today's Date: 03/20/2012 Time: 1528-1600 OT Time Calculation (min): 32 min  OT Assessment / Plan / Recommendation Clinical Impression  Pt s/p R THA due to femoral neck fx. Will benefit from acute OT services to address below problem list in prep for d/c to SNF.    OT Assessment  Patient needs continued OT Services    Follow Up Recommendations  Skilled nursing facility    Barriers to Discharge      Equipment Recommendations  Defer to next venue    Recommendations for Other Services    Frequency  Min 2X/week    Precautions / Restrictions Precautions Precautions: Anterior Hip Precaution Booklet Issued: Yes (comment) Precaution Comments: Educated pt and family on precations. Restrictions Weight Bearing Restrictions: No LLE Weight Bearing: Weight bearing as tolerated   Pertinent Vitals/Pain See vitals    ADL  Eating/Feeding: Performed;Independent Where Assessed - Eating/Feeding: Chair Lower Body Bathing: Simulated;Moderate assistance Where Assessed - Lower Body Bathing: Supported sit to stand Lower Body Dressing: Simulated;Moderate assistance Where Assessed - Lower Body Dressing: Supported sit to Pharmacist, hospital: Electronics engineer Method: Sit to Barista:  (chair) Equipment Used: Gait belt;Rolling walker Transfers/Ambulation Related to ADLs: Max assist for balance and to obtain upright posture ADL Comments: Pt limited by hip pain    OT Diagnosis: Generalized weakness;Acute pain  OT Problem List: Decreased strength;Decreased activity tolerance;Impaired balance (sitting and/or standing);Decreased knowledge of use of DME or AE;Decreased knowledge of precautions;Pain OT Treatment Interventions: Self-care/ADL training;DME and/or AE instruction;Therapeutic activities;Patient/family education;Balance training   OT  Goals Acute Rehab OT Goals OT Goal Formulation: With patient Time For Goal Achievement: 03/27/12 Potential to Achieve Goals: Good ADL Goals Pt Will Perform Grooming: with supervision;Standing at sink ADL Goal: Grooming - Progress: Goal set today Pt Will Transfer to Toilet: with min assist;Ambulation;with DME;Comfort height toilet;Maintaining hip precautions ADL Goal: Toilet Transfer - Progress: Goal set today Pt Will Perform Toileting - Clothing Manipulation: with supervision;Sitting on 3-in-1 or toilet;Standing ADL Goal: Toileting - Clothing Manipulation - Progress: Goal set today Pt Will Perform Toileting - Hygiene: with supervision;Standing at 3-in-1/toilet;Sit to stand from 3-in-1/toilet ADL Goal: Toileting - Hygiene - Progress: Goal set today Miscellaneous OT Goals Miscellaneous OT Goal #1: Pt will perform bed mobility with min assist in prep for EOB ADLs. OT Goal: Miscellaneous Goal #1 - Progress: Goal set today  Visit Information  Last OT Received On: 03/20/12 Assistance Needed: +2 (for safety with ambulation)    Subjective Data      Prior Functioning  Vision/Perception  Home Living Lives With: Son Available Help at Discharge: Family Bathroom Shower/Tub: Nurse, adult Toilet: Standard Home Adaptive Equipment: Straight cane Prior Function Level of Independence: Independent with assistive device(s) Able to Take Stairs?: Yes Driving: No Comments: Pt is very independent and active. Enjoys running errands and cooking. Communication Communication: HOH      Cognition  Overall Cognitive Status: Impaired Area of Impairment: Memory Arousal/Alertness: Awake/alert Orientation Level: Oriented X4 / Intact Behavior During Session: WFL for tasks performed Memory: Decreased recall of precautions Cognition - Other Comments: Difficulty recalling precautions after reviewing at start of session, otherwise cognition is [facility].    Extremity/Trunk Assessment Right Upper  Extremity Assessment RUE ROM/Strength/Tone: [facility] for tasks assessed Left Upper Extremity Assessment LUE ROM/Strength/Tone: [facility] for tasks assessed   Mobility  Shoulder Instructions  Bed Mobility Bed Mobility: Not assessed Transfers Transfers: Sit to Stand;Stand to  Sit Sit to Stand: 2: Max assist;From chair/3-in-1;With armrests;With upper extremity assist Stand to Sit: 2: Max assist;To chair/3-in-1;With armrests;With upper extremity assist Details for Transfer Assistance: Pt performed sit<>stand x2 from chair.  Max assist and cueing for sequencing.  Pt with difficulty fully extending hips and requires cueing to look up (gave focal point of clock on wall to look at).  Tolerated weight on RLE but not entire weight.        Exercise     Balance Balance Balance Assessed: Yes Static Standing Balance Static Standing - Balance Support: Bilateral upper extremity supported Static Standing - Level of Assistance: 3: Mod assist Static Standing - Comment/# of Minutes: Mod assist for standing x2 for 1 min.  Pt required tactile and verbal cueing for posture.   End of Session OT - End of Session Equipment Utilized During Treatment: Gait belt Activity Tolerance: Patient limited by fatigue;Patient limited by pain Patient left: in chair;with call bell/phone within reach;with family/visitor present Nurse Communication: Mobility status  GO    03/20/2012 Cipriano Mile OTR/L Pager [phone] Office [phone]  Cipriano Mile 03/20/2012, 5:12 PM

## 2012-03-20 NOTE — Progress Notes (Signed)
Subjective: 3 Days Post-Op Procedure(s) (LRB): ARTHROPLASTY BIPOLAR HIP (Right) Patient reports pain as mild.    Objective: Vital signs in last 24 hours: Temp:  [97.5 F (36.4 C)-99.2 F (37.3 C)] 98.1 F (36.7 C) (09/21 0556) Pulse Rate:  [58-101] 101  (09/21 0556) Resp:  [13-20] 16  (09/21 0556) BP: (89-136)/(28-77) 136/46 mmHg (09/21 0556) SpO2:  [98 %-100 %] 98 % (09/21 0556)  Intake/Output from previous day: 09/20 0701 - 09/21 0700 In: 1777.5 [P.O.:360; I.V.:812.5; Blood:555; IV Piggyback:50] Out: 850 [Urine:850] Intake/Output this shift:     Basename 03/20/12 0510 03/19/12 0610 03/18/12 0637 03/17/12 1321  HGB 8.8* 7.0* 8.0* 11.8*    Basename 03/20/12 0510 03/19/12 0610  WBC 6.3 8.1  RBC 3.29* 2.76*  HCT 26.8* 21.5*  PLT 137* 162    Basename 03/20/12 0510 03/19/12 0610  NA 135 131*  K 4.2 4.4  CL 102 98  CO2 26 25  BUN 18 25*  CREATININE 0.74 0.89  GLUCOSE 114* 138*  CALCIUM 8.4 8.6    Basename 03/20/12 0510 03/19/12 0610  LABPT -- --  INR 1.54* 1.41    Neurologically intact  Assessment/Plan: 3 Days Post-Op Procedure(s) (LRB): ARTHROPLASTY BIPOLAR HIP (Right) Up with therapy  YATES,MARK C 03/20/2012, 1:18 PM

## 2012-03-20 NOTE — Progress Notes (Signed)
[facility] PROGRESS NOTE  Holly Allen ZOX:[id number] DOB: Nov 12, 1914 DOA: 03/17/2012 PCP: Sheila Oats, MD  Assessment/Plan:  Right femoral neck fracture  -Secondary to mechanical fall  -s/p right hip arthroplasty September 18  -DVT prophylaxis postoperatively x35 days, per ortho  -PT/OT evaluation  -SNF will be available Monday according to social work  Thrombocytopenia -Suspect from Coumadin versus Lovenox -Discontinue Lovenox, start SCDs -If no improvement, may need to consider switch to rivaroxaban if renal function can support it Hypertension  -Continue home dose of metoprolol tartrate  History of stroke, ischemic cerebellum (2007)  -Continue aspirin postop  Leukocytosis  -Likely reactive due to recent events  -improved  Anemia  -Check iron studies  -check guiac  -Transfuse 2 units packed red blood cells (9/20) Tachycardia  -Improved with packed red blood cells -Patient is asymptomatic  -May be related to drop in hemoglobin  -Chest x-ray Community acquired pneumonia -Continue ceftriaxone and azithromycin (9/20)    Family Communication:   Family at beside Disposition Plan:   SNF when medically stable       Procedures/Studies: Dg Chest 1 View  03/17/2012  *RADIOLOGY REPORT*  Clinical Data: Right hip fracture, fall  CHEST - 1 VIEW  Comparison: 04/10/2010  Findings: Upper normal heart size. Calcified tortuous aorta. Enlarged central pulmonary arteries, likely accounting for bilateral hilar prominence, greater on the right. Lungs appear emphysematous with chronic accentuation of interstitial markings at mid-to-lower lungs. No definite infiltrate, pleural effusion or pneumothorax. Marked osseous demineralization. Stable sclerotic focus in a mid thoracic vertebra.  IMPRESSION: Emphysematous and chronic interstitial lung disease changes. Question pulmonary arterial hypertension. No acute abnormalities.   Original Report Authenticated By: Lollie Marrow, M.D.     Dg Hip Complete Right  03/17/2012  *RADIOLOGY REPORT*  Clinical Data: Right hip pain post fall  RIGHT HIP - COMPLETE 2+ VIEW  Comparison: None  Findings: Osseous demineralization. Displaced right femoral neck fracture with overriding. No dislocation or additional fractures identified. Mild narrowing of left hip joint with preservation of right hip joint space. Pelvis appears intact.  IMPRESSION: Displaced right femoral neck fracture.   Original Report Authenticated By: Lollie Marrow, M.D.    Dg Pelvis Portable  03/17/2012  *RADIOLOGY REPORT*  Clinical Data: Hip replacement.  PORTABLE PELVIS  Comparison: Plain films 03/17/2012 at 1357 hours.  Findings: The patient has a new bipolar right hip hemiarthroplasty with three cerclage wires.  The device is located.  No acute fracture is seen.  Surgical staples and gas in the soft tissues noted.  IMPRESSION: Right hip replacement without evidence of complication.   Original Report Authenticated By: Bernadene Bell. Maricela Curet, M.D.    Dg Chest Port 1 View  03/19/2012  *RADIOLOGY REPORT*  Clinical Data: Hypoxia  PORTABLE CHEST - 1 VIEW  Comparison: March 17, 2012  Findings: There is a new small area of pneumonitis in the right base. Elsewhere the lungs are clear.  There is a nipple shadow on the left.  There is underlying emphysema with pulmonary arterial hypertension. No adenopathy.  Aorta is tortuous with atherosclerotic change, stable.  Bones are osteoporotic.  IMPRESSION: New area of patchy infiltrate right base.  Underlying emphysema with pulmonary arterial hypertension.  Stable aortic tortuosity.   Original Report Authenticated By: Arvin Collard. Gaspar Garbe, M.D.    Dg Hip Portable 1 View Right  03/17/2012  *RADIOLOGY REPORT*  Clinical Data: Hip replacement.  PORTABLE RIGHT HIP - 1 VIEW  Comparison: Plain films 03/17/2012 1357 hours.  Findings: New bipolar right  hip hemiarthroplasty with 3 cerclage wire is in place.  Device is located and there is no fracture.   Gas in the soft tissues and surgical staples noted.  IMPRESSION: Right hip replacement without evidence of complication.   Original Report Authenticated By: Bernadene Bell. Maricela Curet, M.D.          Subjective: Patient is feeling well. She denies any chest pain, shortness breath, nausea, vomiting, diarrhea, abdominal pain, dysuria, fevers, chills  Objective: Filed Vitals:   03/20/12 0556 03/20/12 0800 03/20/12 1200 03/20/12 1430  BP: 136/46   133/50  Pulse: 101   93  Temp: 98.1 F (36.7 C)   98 F (36.7 C)  TempSrc:    Oral  Resp: 16 18 18 16   Height:      Weight:      SpO2: 98% 100% 100% 97%    Intake/Output Summary (Last 24 hours) at 03/20/12 1609 Last data filed at 03/20/12 0001  Gross per 24 hour  Intake 1117.5 ml  Output    550 ml  Net  567.5 ml   Weight change:  Exam:   General:  Pt is alert, follows commands appropriately, not in acute distress  HEENT: No icterus, No thrush, [place]/AT  Cardiovascular: RRR, S1/S2, no rubs, no gallops  Respiratory: Bibasilar crackles. Left clear to auscultation  Abdomen: Soft, non tender, non distended, bowel sounds present, no guarding  Extremities: No edema, No lymphangitis, No petechiae, No rashes, no synovitis  Data Reviewed: Basic Metabolic Panel:  Lab 03/20/12 1610 03/19/12 0610 03/18/12 0637 03/17/12 1321  NA 135 131* 136 135  K 4.2 4.4 4.9 4.7  CL 102 98 103 99  CO2 26 25 25 27   GLUCOSE 114* 138* 152* 170*  BUN 18 25* 19 15  CREATININE 0.74 0.89 0.89 0.75  CALCIUM 8.4 8.6 8.7 9.1  MG -- -- -- --  PHOS -- -- -- --   Liver Function Tests:  Lab 03/17/12 1321  AST 19  ALT 9  ALKPHOS 105  BILITOT 0.4  PROT 6.5  ALBUMIN 3.2*   No results found for this basename: LIPASE:5,AMYLASE:5 in the last 168 hours No results found for this basename: AMMONIA:5 in the last 168 hours CBC:  Lab 03/20/12 0510 03/19/12 0610 03/18/12 0637 03/17/12 1321  WBC 6.3 8.1 10.1 18.0*  NEUTROABS -- -- -- 15.2*  HGB 8.8* 7.0* 8.0*  11.8*  HCT 26.8* 21.5* 25.2* 36.7  MCV 81.5 77.9* 78.5 78.6  PLT 137* 162 192 219   Cardiac Enzymes: No results found for this basename: CKTOTAL:5,CKMB:5,CKMBINDEX:5,TROPONINI:5 in the last 168 hours BNP: No components found with this basename: POCBNP:5 CBG: No results found for this basename: GLUCAP:5 in the last 168 hours  No results found for this or any previous visit (from the past 240 hour(s)).   Scheduled Meds:   . aspirin EC  162 mg Oral Daily  . azithromycin  500 mg Oral Daily  . cefTRIAXone (ROCEPHIN) IVPB 1 gram/50 mL D5W  1 g Intravenous Q24H  . cholecalciferol  1,000 Units Oral Daily  . coumadin book  1 each Does not apply Once  . docusate sodium  100 mg Oral BID  . enoxaparin (LOVENOX) injection  30 mg Subcutaneous Daily  . feeding supplement  1 Container Oral Q1400  . metoprolol  50 mg Oral BID  . senna  1 tablet Oral BID  . warfarin  2 mg Oral ONCE-1800  . warfarin  2 mg Oral ONCE-1800  . warfarin  1 each  Does not apply Once  . Warfarin - Pharmacist Dosing Inpatient   Does not apply q1800   Continuous Infusions:   . sodium chloride 75 mL/hr at 03/18/12 1116  . lactated ringers 50 mL/hr (03/20/12 0618)     Kaisyn Millea, DO  [facility] Pager [phone]  If 7PM-7AM, please contact night-coverage [email or web address] Password [email or web address] 03/20/2012, 4:09 PM   LOS: 3 days

## 2012-03-20 NOTE — Progress Notes (Signed)
ANTICOAGULATION CONSULT NOTE - Follow Up Consult  Pharmacy Consult for coumadin Indication: VTE prophylaxis  No Known Allergies  Patient Measurements: Height: 5\' 4"  (162.6 cm) Weight: 115 lb (52.164 kg) IBW/kg (Calculated) : 54.7    Vital Signs: Temp: 98.1 F (36.7 C) (09/21 0556) Temp src: Oral (09/21 0001) BP: 136/46 mmHg (09/21 0556) Pulse Rate: 101  (09/21 0556)  Labs:  Basename 03/20/12 0510 03/19/12 0610 03/18/12 0637 03/17/12 1321  HGB 8.8* 7.0* -- --  HCT 26.8* 21.5* 25.2* --  PLT 137* 162 192 --  APTT -- -- -- 29  LABPROT 18.0* 16.9* 15.5* --  INR 1.54* 1.41 1.25 --  HEPARINUNFRC -- -- -- --  CREATININE 0.74 0.89 0.89 --  CKTOTAL -- -- -- --  CKMB -- -- -- --  TROPONINI -- -- -- --    Estimated Creatinine Clearance: 35.4 ml/min (by C-G formula based on Cr of 0.74).   Medications:  Scheduled:    . acetaminophen  650 mg Oral Once  . aspirin EC  162 mg Oral Daily  . azithromycin  500 mg Oral Daily  . cefTRIAXone (ROCEPHIN) IVPB 1 gram/50 mL D5W  1 g Intravenous Q24H  . cholecalciferol  1,000 Units Oral Daily  . coumadin book  1 each Does not apply Once  . docusate sodium  100 mg Oral BID  . enoxaparin (LOVENOX) injection  30 mg Subcutaneous Daily  . feeding supplement  1 Container Oral Q1400  . metoprolol  50 mg Oral BID  . senna  1 tablet Oral BID  . warfarin  2 mg Oral ONCE-1800  . warfarin  1 each Does not apply Once  . Warfarin - Pharmacist Dosing Inpatient   Does not apply q1800    Assessment: 78 y.o female patient s/p orthopedic surgery for femoral neck fracture on coumadin for DVT ppx. Noted low weight and adv age no active bleeding per RN will dose conservatively. INR today 1.54.  Goal of Therapy:  INR 2-3 Monitor platelets by anticoagulation protocol: Yes   Plan:  1. Coumadin 2mg  x 1 today 2. Follow up daily INR 3. Monitor s/sx of bleeding   Bola A. Wandra Feinstein D Clinical Pharmacist Pager:[phone] Phone [phone] 03/20/2012  10:32 AM

## 2012-03-21 DIAGNOSIS — D509 Iron deficiency anemia, unspecified: Secondary | ICD-10-CM

## 2012-03-21 LAB — CBC
Platelets: 160 10*3/uL (ref 150–400)
RBC: 3.45 MIL/uL — ABNORMAL LOW (ref 3.87–5.11)
RDW: 16.5 % — ABNORMAL HIGH (ref 11.5–15.5)
WBC: 6.2 10*3/uL (ref 4.0–10.5)

## 2012-03-21 LAB — PROTIME-INR: INR: 1.55 — ABNORMAL HIGH (ref 0.00–1.49)

## 2012-03-21 LAB — FERRITIN: Ferritin: 38 ng/mL (ref 10–291)

## 2012-03-21 LAB — BASIC METABOLIC PANEL
CO2: 27 mEq/L (ref 19–32)
Chloride: 99 mEq/L (ref 96–112)
GFR calc Af Amer: 86 mL/min — ABNORMAL LOW (ref >90)
Potassium: 4 mEq/L (ref 3.5–5.1)

## 2012-03-21 LAB — IRON AND TIBC
Iron: 12 ug/dL — ABNORMAL LOW (ref 42–135)
Saturation Ratios: 5 % — ABNORMAL LOW (ref 20–55)
TIBC: 244 ug/dL — ABNORMAL LOW (ref 250–470)
UIBC: 232 ug/dL (ref 125–400)

## 2012-03-21 MED ORDER — SODIUM CHLORIDE 0.9 % IV SOLN
125.0000 mg | Freq: Once | INTRAVENOUS | Status: AC
  Administered 2012-03-21: 125 mg via INTRAVENOUS
  Filled 2012-03-21: qty 10

## 2012-03-21 MED ORDER — TRAMADOL HCL 50 MG PO TABS
50.0000 mg | ORAL_TABLET | Freq: Four times a day (QID) | ORAL | Status: DC | PRN
  Administered 2012-03-21: 50 mg via ORAL
  Filled 2012-03-21 (×2): qty 1

## 2012-03-21 MED ORDER — WARFARIN SODIUM 2.5 MG PO TABS
2.5000 mg | ORAL_TABLET | Freq: Once | ORAL | Status: AC
  Administered 2012-03-21: 2.5 mg via ORAL
  Filled 2012-03-21: qty 1

## 2012-03-21 MED ORDER — LACTULOSE 10 GM/15ML PO SOLN
10.0000 g | Freq: Four times a day (QID) | ORAL | Status: DC
  Administered 2012-03-21 – 2012-03-23 (×4): 10 g via ORAL
  Filled 2012-03-21 (×15): qty 15

## 2012-03-21 MED ORDER — FERROUS SULFATE 325 (65 FE) MG PO TABS
325.0000 mg | ORAL_TABLET | Freq: Three times a day (TID) | ORAL | Status: DC
  Administered 2012-03-22 – 2012-03-24 (×5): 325 mg via ORAL
  Filled 2012-03-21 (×10): qty 1

## 2012-03-21 NOTE — Progress Notes (Signed)
ANTICOAGULATION CONSULT NOTE - Follow Up Consult  Pharmacy Consult for coumadin Indication: VTE prophylaxis  No Known Allergies  Patient Measurements: Height: 5\' 4"  (162.6 cm) Weight: 115 lb (52.164 kg) IBW/kg (Calculated) : 54.7    Vital Signs: Temp: 97.5 F (36.4 C) (09/22 0625) BP: 137/68 mmHg (09/22 0625) Pulse Rate: 125  (09/22 0625)  Labs:  Basename 03/21/12 0550 03/20/12 0510 03/19/12 0610  HGB 9.3* 8.8* --  HCT 28.3* 26.8* 21.5*  PLT 160 137* 162  APTT -- -- --  LABPROT 18.1* 18.0* 16.9*  INR 1.55* 1.54* 1.41  HEPARINUNFRC -- -- --  CREATININE 0.63 0.74 0.89  CKTOTAL -- -- --  CKMB -- -- --  TROPONINI -- -- --    Estimated Creatinine Clearance: 35.4 ml/min (by C-G formula based on Cr of 0.63).   Medications:  Scheduled:    . aspirin EC  162 mg Oral Daily  . azithromycin  500 mg Oral Daily  . cefTRIAXone (ROCEPHIN)  IV  1 g Intravenous Q24H  . cholecalciferol  1,000 Units Oral Daily  . coumadin book  1 each Does not apply Once  . docusate sodium  100 mg Oral BID  . feeding supplement  1 Container Oral Q1400  . metoprolol  50 mg Oral BID  . senna  1 tablet Oral BID  . warfarin  2 mg Oral ONCE-1800  . warfarin  1 each Does not apply Once  . Warfarin - Pharmacist Dosing Inpatient   Does not apply q1800  . DISCONTD: cefTRIAXone (ROCEPHIN) IVPB 1 gram/50 mL D5W  1 g Intravenous Q24H  . DISCONTD: cefTRIAXone (ROCEPHIN)  IV  1 g Intravenous Q24H  . DISCONTD: enoxaparin (LOVENOX) injection  30 mg Subcutaneous Daily    Assessment: 78 y.o female patient s/p orthopedic surgery for femoral neck fracture on coumadin for DVT ppx. Noted low weight and adv age, no active bleeding per RN will dose conservatively. INR today 1.55. H/H 9.3/28.3, plts 160  Goal of Therapy:  INR 2-3 Monitor platelets by anticoagulation protocol: Yes   Plan:  1. Coumadin 2.5 mg x1 2. Follow up daily INR 3. Monitor platelets 4. Monitor s/sx of bleeding  Bola A. Wandra Feinstein  D Clinical Pharmacist Pager:[phone] Phone [phone] 03/21/2012 7:44 AM

## 2012-03-21 NOTE — Progress Notes (Signed)
Pt very sleepy this evening. All neuro checks normal. Pt's right eyelid with slight droop when awakened, but is able to open eyes wide when alert. Pupils equal and reactive to light. Bilateral firm grips. Denies any pain. Received tramadol at 1830 and has been sleepy since. Had difficulty swallowing pills when awakened for hs meds.

## 2012-03-21 NOTE — Progress Notes (Signed)
[facility] PROGRESS NOTE  Holly Allen ZOX:[id number] DOB: 08/23/1914 DOA: 03/17/2012 PCP: Sheila Oats, MD  Assessment/Plan: Right femoral neck fracture  -Secondary to mechanical fall  -s/p right hip arthroplasty September 18  -DVT prophylaxis postoperatively x35 days, per ortho  -PT/OT evaluation  -SNF will be available Monday according to social work  -tramadol for pain Thrombocytopenia  -some improvement today -Suspect from Coumadin versus Lovenox  -Discontinue Lovenox, start SCDs  -If no improvement, may need to consider switch to rivaroxaban if renal function can support it  Hypertension  -Continue home dose of metoprolol tartrate  History of stroke, ischemic cerebellum (2007)  -Continue aspirin Leukocytosis   -improved  Iron Deficiency Anemia  -ferric gluconate and start ferrous sulfate -Transfused 2 units packed red blood cells (9/20)  Tachycardia  -Improved with packed red blood cells  -Patient was asymptomatic  -May be related to drop in hemoglobin  Community acquired pneumonia  -Continue ceftriaxone and azithromycin (9/20)    Family Communication:  Family at beside Disposition Plan:   SNF when medically stable       Procedures/Studies: Dg Chest 1 View  03/17/2012  *RADIOLOGY REPORT*  Clinical Data: Right hip fracture, fall  CHEST - 1 VIEW  Comparison: 04/10/2010  Findings: Upper normal heart size. Calcified tortuous aorta. Enlarged central pulmonary arteries, likely accounting for bilateral hilar prominence, greater on the right. Lungs appear emphysematous with chronic accentuation of interstitial markings at mid-to-lower lungs. No definite infiltrate, pleural effusion or pneumothorax. Marked osseous demineralization. Stable sclerotic focus in a mid thoracic vertebra.  IMPRESSION: Emphysematous and chronic interstitial lung disease changes. Question pulmonary arterial hypertension. No acute abnormalities.   Original Report Authenticated By: Lollie Marrow, M.D.    Dg Hip Complete Right  03/17/2012  *RADIOLOGY REPORT*  Clinical Data: Right hip pain post fall  RIGHT HIP - COMPLETE 2+ VIEW  Comparison: None  Findings: Osseous demineralization. Displaced right femoral neck fracture with overriding. No dislocation or additional fractures identified. Mild narrowing of left hip joint with preservation of right hip joint space. Pelvis appears intact.  IMPRESSION: Displaced right femoral neck fracture.   Original Report Authenticated By: Lollie Marrow, M.D.    Dg Pelvis Portable  03/17/2012  *RADIOLOGY REPORT*  Clinical Data: Hip replacement.  PORTABLE PELVIS  Comparison: Plain films 03/17/2012 at 1357 hours.  Findings: The patient has a new bipolar right hip hemiarthroplasty with three cerclage wires.  The device is located.  No acute fracture is seen.  Surgical staples and gas in the soft tissues noted.  IMPRESSION: Right hip replacement without evidence of complication.   Original Report Authenticated By: Bernadene Bell. Maricela Curet, M.D.    Dg Chest Port 1 View  03/19/2012  *RADIOLOGY REPORT*  Clinical Data: Hypoxia  PORTABLE CHEST - 1 VIEW  Comparison: March 17, 2012  Findings: There is a new small area of pneumonitis in the right base. Elsewhere the lungs are clear.  There is a nipple shadow on the left.  There is underlying emphysema with pulmonary arterial hypertension. No adenopathy.  Aorta is tortuous with atherosclerotic change, stable.  Bones are osteoporotic.  IMPRESSION: New area of patchy infiltrate right base.  Underlying emphysema with pulmonary arterial hypertension.  Stable aortic tortuosity.   Original Report Authenticated By: Arvin Collard. Gaspar Garbe, M.D.    Dg Hip Portable 1 View Right  03/17/2012  *RADIOLOGY REPORT*  Clinical Data: Hip replacement.  PORTABLE RIGHT HIP - 1 VIEW  Comparison: Plain films 03/17/2012 1357 hours.  Findings: New  bipolar right hip hemiarthroplasty with 3 cerclage wire is in place.  Device is located and there  is no fracture.  Gas in the soft tissues and surgical staples noted.  IMPRESSION: Right hip replacement without evidence of complication.   Original Report Authenticated By: Bernadene Bell. Maricela Curet, M.D.          Subjective: Patient is in good spirits.  Denies f/c, cp, sob, n/v/d, abdominal pain, rash, dizziness, HA.  Objective: Filed Vitals:   03/21/12 0800 03/21/12 1140 03/21/12 1326 03/21/12 1600  BP:   115/44   Pulse:   86   Temp:   97.6 F (36.4 C)   TempSrc:   Oral   Resp: 16 16 16 18   Height:      Weight:      SpO2: 100% 96% 99% 100%    Intake/Output Summary (Last 24 hours) at 03/21/12 1754 Last data filed at 03/21/12 0700  Gross per 24 hour  Intake    480 ml  Output    400 ml  Net     80 ml   Weight change:  Exam:   General:  Pt is alert, follows commands appropriately, not in acute distress  HEENT: No icterus, No thrush,  [place]/AT  Cardiovascular: RRR, S1/S2, no rubs, no gallops  Respiratory: R-basilar crackles, no wheezing,  no rhonchi  Abdomen: Soft/+BS, non tender, non distended, no guarding  Extremities: No edema, No lymphangitis, No petechiae, No rashes, no synovitis  Data Reviewed: Basic Metabolic Panel:  Lab 03/21/12 1610 03/20/12 0510 03/19/12 0610 03/18/12 0637 03/17/12 1321  NA 133* 135 131* 136 135  K 4.0 4.2 4.4 4.9 4.7  CL 99 102 98 103 99  CO2 27 26 25 25 27   GLUCOSE 120* 114* 138* 152* 170*  BUN 14 18 25* 19 15  CREATININE 0.63 0.74 0.89 0.89 0.75  CALCIUM 8.7 8.4 8.6 8.7 9.1  MG -- -- -- -- --  PHOS -- -- -- -- --   Liver Function Tests:  Lab 03/17/12 1321  AST 19  ALT 9  ALKPHOS 105  BILITOT 0.4  PROT 6.5  ALBUMIN 3.2*   No results found for this basename: LIPASE:5,AMYLASE:5 in the last 168 hours No results found for this basename: AMMONIA:5 in the last 168 hours CBC:  Lab 03/21/12 0550 03/20/12 0510 03/19/12 0610 03/18/12 0637 03/17/12 1321  WBC 6.2 6.3 8.1 10.1 18.0*  NEUTROABS -- -- -- -- 15.2*  HGB 9.3* 8.8* 7.0*  8.0* 11.8*  HCT 28.3* 26.8* 21.5* 25.2* 36.7  MCV 82.0 81.5 77.9* 78.5 78.6  PLT 160 137* 162 192 219   Cardiac Enzymes: No results found for this basename: CKTOTAL:5,CKMB:5,CKMBINDEX:5,TROPONINI:5 in the last 168 hours BNP: No components found with this basename: POCBNP:5 CBG: No results found for this basename: GLUCAP:5 in the last 168 hours  Recent Results (from the past 240 hour(s))  CULTURE, BLOOD (ROUTINE X 2)     Status: Normal (Preliminary result)   Collection Time   03/19/12  9:19 PM      Component Value Range Status Comment   Specimen Description BLOOD RIGHT ARM   Final    Special Requests BOTTLES DRAWN AEROBIC AND ANAEROBIC 10CC   Final    Culture  Setup Time 03/20/2012 02:41   Final    Culture     Final    Value:        BLOOD CULTURE RECEIVED NO GROWTH TO DATE CULTURE WILL BE HELD FOR 5 DAYS BEFORE ISSUING A  FINAL NEGATIVE REPORT   Report Status PENDING   Incomplete   CULTURE, BLOOD (ROUTINE X 2)     Status: Normal (Preliminary result)   Collection Time   03/19/12  9:23 PM      Component Value Range Status Comment   Specimen Description BLOOD RIGHT ARM   Final    Special Requests BOTTLES DRAWN AEROBIC AND ANAEROBIC 10CC   Final    Culture  Setup Time 03/20/2012 02:41   Final    Culture     Final    Value:        BLOOD CULTURE RECEIVED NO GROWTH TO DATE CULTURE WILL BE HELD FOR 5 DAYS BEFORE ISSUING A FINAL NEGATIVE REPORT   Report Status PENDING   Incomplete      Scheduled Meds:   . aspirin EC  162 mg Oral Daily  . azithromycin  500 mg Oral Daily  . cefTRIAXone (ROCEPHIN)  IV  1 g Intravenous Q24H  . cholecalciferol  1,000 Units Oral Daily  . coumadin book  1 each Does not apply Once  . docusate sodium  100 mg Oral BID  . feeding supplement  1 Container Oral Q1400  . ferric gluconate (FERRLECIT/NULECIT) IV  125 mg Intravenous Once  . ferrous sulfate  325 mg Oral TID WC  . lactulose  10 g Oral Q6H  . metoprolol  50 mg Oral BID  . senna  1 tablet Oral BID  .  warfarin  2 mg Oral ONCE-1800  . warfarin  2.5 mg Oral ONCE-1800  . warfarin  1 each Does not apply Once  . Warfarin - Pharmacist Dosing Inpatient   Does not apply q1800  . DISCONTD: cefTRIAXone (ROCEPHIN) IVPB 1 gram/50 mL D5W  1 g Intravenous Q24H  . DISCONTD: cefTRIAXone (ROCEPHIN)  IV  1 g Intravenous Q24H   Continuous Infusions:    Camil Hausmann, DO  [facility] Pager [phone]  If 7PM-7AM, please contact night-coverage [email or web address] Password [email or web address] 03/21/2012, 5:54 PM   LOS: 4 days

## 2012-03-21 NOTE — Progress Notes (Signed)
Subjective: 4 Days Post-Op Procedure(s) (LRB): ARTHROPLASTY BIPOLAR HIP (Right) Patient reports pain as moderate.    Objective: Vital signs in last 24 hours: Temp:  [97.3 F (36.3 C)-98 F (36.7 C)] 97.5 F (36.4 C) (09/22 0625) Pulse Rate:  [93-125] 125  (09/22 0625) Resp:  [16-18] 16  (09/22 0800) BP: (128-137)/(50-68) 137/68 mmHg (09/22 0625) SpO2:  [97 %-100 %] 100 % (09/22 0800)  Intake/Output from previous day: 09/21 0701 - 09/22 0700 In: 480 [P.O.:480] Out: 400 [Urine:400] Intake/Output this shift:     Basename 03/21/12 0550 03/20/12 0510 03/19/12 0610  HGB 9.3* 8.8* 7.0*    Basename 03/21/12 0550 03/20/12 0510  WBC 6.2 6.3  RBC 3.45* 3.29*  HCT 28.3* 26.8*  PLT 160 137*    Basename 03/21/12 0550 03/20/12 0510  NA 133* 135  K 4.0 4.2  CL 99 102  CO2 27 26  BUN 14 18  CREATININE 0.63 0.74  GLUCOSE 120* 114*  CALCIUM 8.7 8.4    Basename 03/21/12 0550 03/20/12 0510  LABPT -- --  INR 1.55* 1.54*    Neurologically intact  Assessment/Plan: 4 Days Post-Op Procedure(s) (LRB): ARTHROPLASTY BIPOLAR HIP (Right) Up with therapy  Nasif Bos C 03/21/2012, 10:29 AM

## 2012-03-22 ENCOUNTER — Inpatient Hospital Stay (HOSPITAL_COMMUNITY): Payer: Medicare Other

## 2012-03-22 DIAGNOSIS — I639 Cerebral infarction, unspecified: Secondary | ICD-10-CM | POA: Diagnosis present

## 2012-03-22 DIAGNOSIS — R6889 Other general symptoms and signs: Secondary | ICD-10-CM

## 2012-03-22 DIAGNOSIS — D649 Anemia, unspecified: Secondary | ICD-10-CM

## 2012-03-22 DIAGNOSIS — G8190 Hemiplegia, unspecified affecting unspecified side: Secondary | ICD-10-CM

## 2012-03-22 DIAGNOSIS — J4489 Other specified chronic obstructive pulmonary disease: Secondary | ICD-10-CM

## 2012-03-22 DIAGNOSIS — I6350 Cerebral infarction due to unspecified occlusion or stenosis of unspecified cerebral artery: Secondary | ICD-10-CM

## 2012-03-22 DIAGNOSIS — J449 Chronic obstructive pulmonary disease, unspecified: Secondary | ICD-10-CM

## 2012-03-22 DIAGNOSIS — S72009A Fracture of unspecified part of neck of unspecified femur, initial encounter for closed fracture: Principal | ICD-10-CM

## 2012-03-22 LAB — CBC
MCH: 27.1 pg (ref 26.0–34.0)
MCHC: 32.6 g/dL (ref 30.0–36.0)
MCV: 83.2 fL (ref 78.0–100.0)
Platelets: 218 10*3/uL (ref 150–400)
RBC: 3.69 MIL/uL — ABNORMAL LOW (ref 3.87–5.11)

## 2012-03-22 LAB — BASIC METABOLIC PANEL
CO2: 30 mEq/L (ref 19–32)
Calcium: 8.7 mg/dL (ref 8.4–10.5)
Creatinine, Ser: 0.58 mg/dL (ref 0.50–1.10)
GFR calc non Af Amer: 76 mL/min — ABNORMAL LOW (ref >90)
Glucose, Bld: 114 mg/dL — ABNORMAL HIGH (ref 70–99)

## 2012-03-22 LAB — HEMOGLOBIN A1C: Hgb A1c MFr Bld: 6.1 % — ABNORMAL HIGH (ref <5.7)

## 2012-03-22 MED ORDER — METHOCARBAMOL 500 MG PO TABS: 500.0000 mg | ORAL_TABLET | Freq: Four times a day (QID) | ORAL | Status: DC

## 2012-03-22 MED ORDER — TRAMADOL HCL 50 MG PO TABS: 50.0000 mg | ORAL_TABLET | Freq: Four times a day (QID) | ORAL | Status: DC | PRN

## 2012-03-22 MED ORDER — WARFARIN SODIUM 2.5 MG PO TABS: 2.5000 mg | ORAL_TABLET | Freq: Every day | ORAL | Status: DC

## 2012-03-22 MED ORDER — AZITHROMYCIN 500 MG PO TABS: 500.0000 mg | ORAL_TABLET | Freq: Every day | ORAL | Status: DC

## 2012-03-22 MED ORDER — FERROUS SULFATE 325 (65 FE) MG PO TABS: 325.0000 mg | ORAL_TABLET | Freq: Three times a day (TID) | ORAL | Status: DC

## 2012-03-22 MED ORDER — CHLORHEXIDINE GLUCONATE 0.12 % MT SOLN
15.0000 mL | Freq: Two times a day (BID) | OROMUCOSAL | Status: DC
  Administered 2012-03-22 – 2012-03-24 (×3): 15 mL via OROMUCOSAL
  Filled 2012-03-22 (×5): qty 15

## 2012-03-22 MED ORDER — DSS 100 MG PO CAPS: 100.0000 mg | ORAL_CAPSULE | Freq: Two times a day (BID) | ORAL | Status: DC

## 2012-03-22 MED ORDER — ACETAMINOPHEN 325 MG PO TABS
650.0000 mg | ORAL_TABLET | Freq: Four times a day (QID) | ORAL | Status: DC | PRN
  Administered 2012-03-22 – 2012-03-23 (×2): 650 mg via ORAL
  Filled 2012-03-22: qty 2

## 2012-03-22 MED ORDER — CEFDINIR 300 MG PO CAPS: 300.0000 mg | ORAL_CAPSULE | Freq: Two times a day (BID) | ORAL | Status: DC

## 2012-03-22 MED ORDER — WARFARIN - PHARMACIST DOSING INPATIENT: Status: DC

## 2012-03-22 MED ORDER — ASPIRIN 300 MG RE SUPP
150.0000 mg | Freq: Every day | RECTAL | Status: DC
  Filled 2012-03-22: qty 1

## 2012-03-22 NOTE — Progress Notes (Signed)
*  PRELIMINARY RESULTS* Echocardiogram 2D Echocardiogram has been performed.  Holly Allen 03/22/2012, 1:29 PM

## 2012-03-22 NOTE — Discharge Summary (Addendum)
Physician Discharge Summary  Holly Allen ZOX:[id number] DOB: April 27, 1915 DOA: 03/17/2012  PCP: Sheila Oats, MD  Admit date: 03/17/2012 Discharge date: 03/24/2012  Recommendations for Continued Care:  1. Pt will need to follow up with PCP in 2-3 weeks post discharge 2. Check INR daily with pharmacy to adjust Coumadin for INR 2-3, next draw 9/24 3.   Discharge Diagnoses:  Active Problems:  Fracture of femoral neck, right  HTN (hypertension)  Hx of stroke without residual deficits  Leukocytosis  Anemia  COPD (chronic obstructive pulmonary disease)  Pneumonia  Iron deficiency anemia  Hemiparesis  Stroke  Acute right MCA stroke   Right femoral neck fracture  -Secondary to mechanical fall  -s/p right hip arthroplasty September 18  -DVT prophylaxis postoperatively x35 days, per ortho , this time patient will be on Coumadin should be discontinued after it's okay by orthopedics to stop it, thereafter Aggrenox versus Plavix for stroke prophylaxis as per neurologist Dr. CT -tramadol for pain    Thrombocytopenia  Nonspecific and resolved   Hypertension  -Continue home dose of metoprolol tartrate    History of stroke, ischemic cerebellum (2007)  -Continue Coumadin discussed with Dr. Pearlean Brownie in detail no further changes no further recommendations he will see her in the office. Stable echogram and carotid duplex. Please see MRI MRA report for all details..    Iron Deficiency Anemia  -ferric gluconate given during hospitalization  -Continue ferrous sulfate 3 times a day x1 month, then twice a day indefinitely -Transfused 2 units packed red blood cells (9/20)    Tachycardia  -Improved with packed red blood cells  -Patient was asymptomatic  -May be related to drop in hemoglobin    Community acquired pneumonia  Initial prescribed antibiotics on 03/26/2012   Discharge Condition: Stable  Disposition:  Follow-up Information    Follow up with DEFAULT,PROVIDER, MD.  Schedule an appointment as soon as possible for a visit in 3 days.   Contact information:   [address] [place] [address] [phone]       Follow up with Gates Rigg, MD. Schedule an appointment as soon as possible for a visit in 2 weeks.   Contact information:   [address] [facility] [place] [address] [phone]       Follow up with Kathryne Hitch, MD. Schedule an appointment as soon as possible for a visit in 1 week.   Contact information:   [facility] [address] [place] [address] [phone]         skilled nursing facility  Diet: Regular Wt Readings from Last 3 Encounters:  03/17/12 52.164 kg (115 lb)  03/17/12 52.164 kg (115 lb)    History of present illness:   78 year old female who lives alone presented to the emergency department after a mechanical fall on her driveway. The patient cannot get up after she took a misstep and fell onto her right hip. The patient denied any prodromal symptoms including dizziness, chest discomfort, palpitations, visual changes, or aura. The patient had not been on any new medications. She was in her usual state of health. The patient was brought to [facility] by EMS.  Hospital Course:  78 year old female who lives alone presented to the emergency department after a mechanical fall on her driveway. The patient cannot get up after she took a misstep and fell onto her right hip. The patient denied any prodromal symptoms including dizziness, chest discomfort, palpitations, visual changes, or aura. The patient had  not been on any new medications. She was in her usual state of health. The patient was brought to [facility] by EMS. The patient underwent a right hip arthroplasty on 03/17/2012. There were no immediate postoperative complications. Postoperatively, she was noted to have anemia with a hemoglobin of 7.0. The patient was given 2 units of packed  red blood cells. Her hemoglobin responded appropriately. Iron studies revealed the patient was iron deficient. The patient was given a dose of intravenous ferric gluconate and started on ferrous sulfate. The patient was constipated. She was given Colace and Senokot as well as lactulose with improvement. The patient was started on Coumadin for DVT prophylaxis. This will be continued at the skilled nursing facility. Her INR will need to be monitored closely until it reaches 2-3. The pharmacy at a skilled nursing facility will adjust her Coumadin dose. She will be discharged on a dose of 2.5 mg daily. The patient was noted to be mildly hypoxemic. In discussing with the son, he stated that the patient has had a history of COPD and oxygen was prescribed for her in the past, but the patient had refused to wear oxygen. Chest x-ray revealed right lower lobe infiltrate. It was determined that the hypoxemia was a combination of her COPD and community-acquired pneumonia. The patient was provided supplemental oxygen and started on ceftriaxone and azithromycin. The patient remained clinically stable. She'll be discharged with Omnicef and azithromycin. She'll be continued on supplemental oxygen at 2 L.   Consultants: Orthopedic surgery, Dr. Magnus Ivan  Discharge Exam: Filed Vitals:   03/24/12 0532  BP: 130/53  Pulse: 93  Temp: 98.1 F (36.7 C)  Resp: 18   Filed Vitals:   03/23/12 2136 03/24/12 0000 03/24/12 0400 03/24/12 0532  BP: 126/53   130/53  Pulse: 105   93  Temp: 98.3 F (36.8 C)   98.1 F (36.7 C)  TempSrc:      Resp: 18 16 16 18   Height:      Weight:      SpO2: 95%   95%   General: A&O x 3, NAD, pleasant, cooperative Cardiovascular: RRR, no rub, no gallop, no S3 Respiratory: Right basilar crackles, no wheeze, no rhonchi, left clear to auscultation Abdomen:soft, nontender, nondistended, positive bowel sounds Extremity: No cyanosis, petechiae, rashes. Discharge Instructions      Discharge  Orders    Future Orders Please Complete By Expires   Diet - low sodium heart healthy      Increase activity slowly      Discharge instructions      Comments:   Patient will be discharged on Coumadin 2.5 mg. Goal INR 2-3. Pharmacy at skilled nursing facility to adjust dose and monitor PT INR.       Medication List     As of 03/24/2012 11:43 AM    STOP taking these medications         aspirin EC 81 MG tablet      TAKE these medications         azithromycin 500 MG tablet   Commonly known as: ZITHROMAX   Take 1 tablet (500 mg total) by mouth daily. Stop after 03/26/2012      cefUROXime 500 MG tablet   Commonly known as: CEFTIN   Take 1 tablet (500 mg total) by mouth 2 (two) times daily. Continue till 03/26/2012      DSS 100 MG Caps   Take 100 mg by mouth 2 (two) times daily.  feeding supplement Liqd   Take 1 Container by mouth daily at 2 PM daily at 2 PM.      ferrous sulfate 325 (65 FE) MG tablet   Take 1 tablet (325 mg total) by mouth 3 (three) times daily with meals.      methocarbamol 500 MG tablet   Commonly known as: ROBAXIN   Take 1 tablet (500 mg total) by mouth 4 (four) times daily.      metoprolol 50 MG tablet   Commonly known as: LOPRESSOR   Take 50 mg by mouth 2 (two) times daily.      simvastatin 10 MG tablet   Commonly known as: ZOCOR   Take 1 tablet (10 mg total) by mouth daily at 6 PM.      traMADol 50 MG tablet   Commonly known as: ULTRAM   Take 1 tablet (50 mg total) by mouth every 8 (eight) hours as needed.      VITAMIN D PO   Take 1 tablet by mouth every evening.      warfarin 2.5 MG tablet   Commonly known as: COUMADIN   Take 1 tablet (2.5 mg total) by mouth daily. Check INR 3 times a week goal INR 2-3, last INR one 1.2           The results of significant diagnostics from this hospitalization (including imaging, microbiology, ancillary and laboratory) are listed below for reference.    Significant Diagnostic Studies: Dg Chest  1 View  03/17/2012  *RADIOLOGY REPORT*  Clinical Data: Right hip fracture, fall  CHEST - 1 VIEW  Comparison: 04/10/2010  Findings: Upper normal heart size. Calcified tortuous aorta. Enlarged central pulmonary arteries, likely accounting for bilateral hilar prominence, greater on the right. Lungs appear emphysematous with chronic accentuation of interstitial markings at mid-to-lower lungs. No definite infiltrate, pleural effusion or pneumothorax. Marked osseous demineralization. Stable sclerotic focus in a mid thoracic vertebra.  IMPRESSION: Emphysematous and chronic interstitial lung disease changes. Question pulmonary arterial hypertension. No acute abnormalities.   Original Report Authenticated By: Lollie Marrow, M.D.    Dg Hip Complete Right  03/17/2012  *RADIOLOGY REPORT*  Clinical Data: Right hip pain post fall  RIGHT HIP - COMPLETE 2+ VIEW  Comparison: None  Findings: Osseous demineralization. Displaced right femoral neck fracture with overriding. No dislocation or additional fractures identified. Mild narrowing of left hip joint with preservation of right hip joint space. Pelvis appears intact.  IMPRESSION: Displaced right femoral neck fracture.   Original Report Authenticated By: Lollie Marrow, M.D.    Dg Pelvis Portable  03/17/2012  *RADIOLOGY REPORT*  Clinical Data: Hip replacement.  PORTABLE PELVIS  Comparison: Plain films 03/17/2012 at 1357 hours.  Findings: The patient has a new bipolar right hip hemiarthroplasty with three cerclage wires.  The device is located.  No acute fracture is seen.  Surgical staples and gas in the soft tissues noted.  IMPRESSION: Right hip replacement without evidence of complication.   Original Report Authenticated By: Bernadene Bell. Maricela Curet, M.D.    Dg Chest Port 1 View  03/19/2012  *RADIOLOGY REPORT*  Clinical Data: Hypoxia  PORTABLE CHEST - 1 VIEW  Comparison: March 17, 2012  Findings: There is a new small area of pneumonitis in the right base. Elsewhere the  lungs are clear.  There is a nipple shadow on the left.  There is underlying emphysema with pulmonary arterial hypertension. No adenopathy.  Aorta is tortuous with atherosclerotic change, stable.  Bones are osteoporotic.  IMPRESSION:  New area of patchy infiltrate right base.  Underlying emphysema with pulmonary arterial hypertension.  Stable aortic tortuosity.   Original Report Authenticated By: Arvin Collard. Gaspar Garbe, M.D.    Dg Hip Portable 1 View Right  03/17/2012  *RADIOLOGY REPORT*  Clinical Data: Hip replacement.  PORTABLE RIGHT HIP - 1 VIEW  Comparison: Plain films 03/17/2012 1357 hours.  Findings: New bipolar right hip hemiarthroplasty with 3 cerclage wire is in place.  Device is located and there is no fracture.  Gas in the soft tissues and surgical staples noted.  IMPRESSION: Right hip replacement without evidence of complication.   Original Report Authenticated By: Bernadene Bell. Maricela Curet, M.D.      Microbiology: Recent Results (from the past 240 hour(s))  CULTURE, BLOOD (ROUTINE X 2)     Status: Normal (Preliminary result)   Collection Time   03/19/12  9:19 PM      Component Value Range Status Comment   Specimen Description BLOOD RIGHT ARM   Final    Special Requests BOTTLES DRAWN AEROBIC AND ANAEROBIC 10CC   Final    Culture  Setup Time 03/20/2012 02:41   Final    Culture     Final    Value:        BLOOD CULTURE RECEIVED NO GROWTH TO DATE CULTURE WILL BE HELD FOR 5 DAYS BEFORE ISSUING A FINAL NEGATIVE REPORT   Report Status PENDING   Incomplete   CULTURE, BLOOD (ROUTINE X 2)     Status: Normal (Preliminary result)   Collection Time   03/19/12  9:23 PM      Component Value Range Status Comment   Specimen Description BLOOD RIGHT ARM   Final    Special Requests BOTTLES DRAWN AEROBIC AND ANAEROBIC 10CC   Final    Culture  Setup Time 03/20/2012 02:41   Final    Culture     Final    Value:        BLOOD CULTURE RECEIVED NO GROWTH TO DATE CULTURE WILL BE HELD FOR 5 DAYS BEFORE ISSUING A  FINAL NEGATIVE REPORT   Report Status PENDING   Incomplete      Labs: Basic Metabolic Panel:  Lab 03/24/12 4782 03/23/12 0500 03/22/12 0750 03/21/12 0550 03/20/12 0510  NA 135 138 136 133* 135  K 3.6 3.6 -- -- --  CL 100 104 101 99 102  CO2 27 26 30 27 26   GLUCOSE 112* 116* 114* 120* 114*  BUN 11 12 15 14 18   CREATININE 0.58 0.51 0.58 0.63 0.74  CALCIUM 8.5 8.7 8.7 8.7 8.4  MG -- -- -- -- --  PHOS -- -- -- -- --   Liver Function Tests:  Lab 03/17/12 1321  AST 19  ALT 9  ALKPHOS 105  BILITOT 0.4  PROT 6.5  ALBUMIN 3.2*   No results found for this basename: LIPASE:5,AMYLASE:5 in the last 168 hours No results found for this basename: AMMONIA:5 in the last 168 hours CBC:  Lab 03/24/12 0500 03/23/12 0500 03/22/12 0750 03/21/12 0550 03/20/12 0510 03/17/12 1321  WBC 6.8 5.0 6.1 6.2 6.3 --  NEUTROABS -- -- -- -- -- 15.2*  HGB 9.6* 9.3* 10.0* 9.3* 8.8* --  HCT 29.6* 28.9* 30.7* 28.3* 26.8* --  MCV 83.9 83.5 83.2 82.0 81.5 --  PLT 255 214 218 160 137* --   Cardiac Enzymes: No results found for this basename: CKTOTAL:5,CKMB:5,CKMBINDEX:5,TROPONINI:5 in the last 168 hours BNP: No components found with this basename: POCBNP:5 CBG: No results found  for this basename: GLUCAP:5 in the last 168 hours  Time coordinating discharge:  Greater than 30 minutes  Signed:  Leroy Sea, DO [facility] Pager: [phone] 03/24/2012, 11:43 AM

## 2012-03-22 NOTE — Progress Notes (Signed)
ANTICOAGULATION CONSULT - COUMADIN  Pharmacy Consult for Coumadin  HPI: 78 y.o.female admitted with a Hip fracture and a history of CVA without residual deficits who is on Coumadin for VTE prophylaxis post-op.  Allergies: No Known Allergies  Height/Weight: Height: 5\' 4"  (162.6 cm) Weight: 115 lb (52.164 kg) IBW/kg (Calculated) : 54.7   Vitals: Blood pressure 132/49, pulse 89, temperature 97.3 F (36.3 C), temperature source Oral, resp. rate 16, height 5\' 4"  (1.626 m), weight 115 lb (52.164 kg), SpO2 93.00%.  Current active problems: Active Problems:  Fracture of femoral neck, right  HTN (hypertension)  Hx of stroke without residual deficits  Leukocytosis  Anemia  COPD (chronic obstructive pulmonary disease)  Pneumonia  Iron deficiency anemia   Medical / Surgical History: Diagnosis  Hip arthroplasty  Hypertension   Current Labs:   [facility] 03/22/12 0750 03/21/12 0550 03/20/12 0510  HGB 10.0* 9.3* --  HCT 30.7* 28.3* 26.8*  PLT 218 160 137*  LABPROT 18.6* 18.1* 18.0*  INR 1.61* 1.55* 1.54*  CREATININE 0.58 0.63 0.74   Lab Results  Component Value Date   INR 1.61* 03/22/2012   INR 1.55* 03/21/2012   INR 1.54* 03/20/2012   Estimated Creatinine Clearance: 35.4 ml/min (by C-G formula based on Cr of 0.58).  Pertinent Medication History: Prescriptions prior to admission  Medication Sig Dispense Refill  . aspirin EC 81 MG tablet Take 162 mg by mouth daily.       . Cholecalciferol (VITAMIN D PO) Take 1 tablet by mouth every evening.      . metoprolol (LOPRESSOR) 50 MG tablet Take 50 mg by mouth 2 (two) times daily.       Scheduled:    . aspirin EC  162 mg Oral Daily  . azithromycin  500 mg Oral Daily  . cefTRIAXone (ROCEPHIN)  IV  1 g Intravenous Q24H  . cholecalciferol  1,000 Units Oral Daily  . coumadin book  1 each Does not apply Once  . docusate sodium  100 mg Oral BID  . feeding supplement  1 Container Oral Q1400  . ferric gluconate (FERRLECIT/NULECIT)  IV  125 mg Intravenous Once  . ferrous sulfate  325 mg Oral TID WC  . lactulose  10 g Oral Q6H  . metoprolol  50 mg Oral BID  . senna  1 tablet Oral BID  . warfarin  2.5 mg Oral ONCE-1800  . warfarin  1 each Does not apply Once  . Warfarin - Pharmacist Dosing Inpatient   Does not apply q1800   Anti-infectives     Start     Dose/Rate Route Frequency Ordered Stop   03/22/12 0000   azithromycin (ZITHROMAX) 500 MG tablet        500 mg Oral Daily 03/22/12 0759     03/22/12 0000   cefdinir (OMNICEF) 300 MG capsule        300 mg Oral 2 times daily 03/22/12 0759     03/20/12 2200   cefTRIAXone (ROCEPHIN) 1 g in dextrose 5 % 50 mL IVPB  Status:  Discontinued        1 g 100 mL/hr over 30 Minutes Intravenous Every 24 hours 03/20/12 2022 03/20/12 2025   03/20/12 2000   cefTRIAXone (ROCEPHIN) 1 g in dextrose 5 % 50 mL IVPB        1 g 100 mL/hr over 30 Minutes Intravenous Every 24 hours 03/20/12 2026     03/19/12 1800   cefTRIAXone (ROCEPHIN) 1 g in dextrose 5 % 50  mL IVPB  Status:  Discontinued        1 g 100 mL/hr over 30 Minutes Intravenous Every 24 hours 03/19/12 1705 03/20/12 2022   03/19/12 1800   azithromycin (ZITHROMAX) tablet 500 mg        500 mg Oral Daily 03/19/12 1705     03/18/12 1000   ceFAZolin (ANCEF) IVPB 1 g/50 mL premix        1 g 100 mL/hr over 30 Minutes Intravenous 3 times per day 03/18/12 0946 03/18/12 1815   03/18/12 0200   ceFAZolin (ANCEF) IVPB 1 g/50 mL premix  Status:  Discontinued        1 g 100 mL/hr over 30 Minutes Intravenous 3 times per day 03/17/12 2304 03/18/12 0946          Assessment:  Concern this AM by patient's family AV:[id number] speech and weakness on L-side and possible new stroke.  Neurology consult pending.  INR 1.61, Hgb 10. [stable]  Goals:  Target INR of 2-3.  Plan:  Will HOLD Coumadin pending Neurology evaluation to r/o new CVA.    Daily INR's, CBC.  Will follow up after neurology to determine further Coumadin  requirements.  Keaundra Stehle, Elisha Headland, Pharm. D. 03/22/2012, 11:07 AM

## 2012-03-22 NOTE — Evaluation (Signed)
Clinical/Bedside Swallow Evaluation Patient Details  Name: Holly Allen MRN: [id number] Date of Birth: 1915-05-03  Today's Date: 03/22/2012 Time: 0205-0230 SLP Time Calculation (min): 25 min  Past Medical History:  Past Medical History  Diagnosis Date  . Hypertension    Past Surgical History:  Past Surgical History  Procedure Date  . Hip arthroplasty 03/17/2012    Procedure: ARTHROPLASTY BIPOLAR HIP;  Surgeon: Kathryne Hitch, MD;  Location: [facility] OR;  Service: Orthopedics;  Laterality: Right;   HPI:  78 y/o female with h/x of HTN and CVA (cerebellar stroke in 2007). Larey Seat and broke her hip. No CT evidence for acute infarction. MRI on 03/22/2013 (results are unknown at this time). (Simultaneous filing. User may not have seen previous data.)   Assessment / Plan / Recommendation Clinical Impression  Pt. presents with moderate oral dysphagia characterized by prolonged mastication, increased oral transit time, labial residue, and oral pocketing with solids. Pt.'s family reports prolonged mastication prior to admit. Reduced labial/lingual weakness and sensation observed on left side likely causing residue and oral pocketing. No suspected pharyngeal impairments present at this time. Pt. and family educated on compensatory strategies. Given her ability to remove buccal pocketing with cues, SLP recommends continuing regular diet with thin liquids. Will f/u to further evaluate swallow safety function.     Aspiration Risk  Mild    Diet Recommendation Regular;Thin liquid   Liquid Administration via: Cup Medication Administration: Whole meds with liquid Supervision: Patient able to self feed;Full supervision/cueing for compensatory strategies Compensations: Slow rate;Small sips/bites;Check for pocketing;Check for anterior loss Postural Changes and/or Swallow Maneuvers: Seated upright 90 degrees;Upright 30-60 min after meal    Other  Recommendations Oral Care Recommendations: Oral care BID    Follow Up Recommendations       Frequency and Duration min 2x/week  2 weeks       SLP Swallow Goals Patient will consume recommended diet without observed clinical signs of aspiration with: Minimal cueing Patient will utilize recommended strategies during swallow to increase swallowing safety with: Minimal cueing   Swallow Study Prior Functional Status       General Date of Onset: 03/17/12 HPI: 78 y/o female with h/x of HTN and CVA (cerebellar stroke in 2007). Larey Seat and broke her hip. No CT evidence for acute infarction. MRI on 03/22/2013 (results are unknown at this time). (Simultaneous filing. User may not have seen previous data.) Type of Study: Bedside swallow evaluation Diet Prior to this Study: Regular;Thin liquids Temperature Spikes Noted: No Respiratory Status: Supplemental O2 delivered via (comment) History of Recent Intubation: No Behavior/Cognition: Alert;Pleasant mood;Cooperative;Hard of hearing Oral Cavity - Dentition: Adequate natural dentition Self-Feeding Abilities: Able to feed self Patient Positioning: Upright in bed Baseline Vocal Quality: Low vocal intensity Volitional Cough: Weak Volitional Swallow: Able to elicit    Oral/Motor/Sensory Function Overall Oral Motor/Sensory Function: Impaired Labial ROM: Reduced left Labial Symmetry: Abnormal symmetry left Labial Strength: Reduced Labial Sensation: Reduced Lingual ROM: Reduced left Lingual Symmetry: Abnormal symmetry left Lingual Strength: Reduced Lingual Sensation: Reduced Facial ROM: Reduced left Facial Symmetry: Left droop Facial Strength: Reduced Facial Sensation: Reduced Velum: Within Functional Limits Mandible: Within Functional Limits   Ice Chips Ice chips: Not tested   Thin Liquid Thin Liquid: Within functional limits    Nectar Thick Nectar Thick Liquid: Not tested   Honey Thick     Puree Puree: Not tested   Solid   GO    Solid: Impaired Presentation: Self Fed Oral Phase  Impairments: Reduced lingual movement/coordination  Oral Phase Functional Implications: Other (comment);Left lateral sulci pocketing (labial residue on left side ) Pharyngeal Phase Impairments: Other (comments) (audible swallo)       Milagros Loll, Emri Sample 03/22/2012,3:32 PM

## 2012-03-22 NOTE — Progress Notes (Signed)
*  PRELIMINARY RESULTS* Vascular Ultrasound Carotid Doppler has been completed.  Preliminary findings: bilaterally no significant ICA stenosis with antegrade vertebral flow.  Farrel Demark, RDMS, RVT  03/22/2012, 1:39 PM

## 2012-03-22 NOTE — Progress Notes (Signed)
Bed is available at [facility]. DC delayed due to patient needing MRI- family is concerned about possible CVA.  CSW provided support and will assist with d/c to SNF when medically stable per MD.   Lupita Leash T. West Pugh  [phone]

## 2012-03-22 NOTE — Consult Note (Signed)
[facility] CONSULT NOTE     Reason for Consult: Slurred speech    HPI:    Holly Allen is an 78 y.o. female  who presented to the emergency department after a mechanical fall in her driveway.  Patient took a misstep and fell onto her right hip. The patient was not able to get up. The patient denied any prodromal symptoms. She denied any dizziness, chest discomfort, palpitations, visual changes, headache. X-rays in the emergency department revealed a displaced right femoral neck fracture. Patient had a remote stroke in 2007 without any significant sequelae. Patient underwent R hip arthroplasty and while in hospital was treated for CAP. Patient was to be discharged today but family felt they noted slurred speech this AM (7am) along with left arm weakness.  Neurology was called to further evaluate possibility of stroke.   Patient family states she had a "slight stroke in 2007" but cannot tell me which side was affected.  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 They do know they were seen by Dr. Pearlean Brownie in th hospital.   Past Medical History  Diagnosis Date  . Hypertension     Past Surgical History  Procedure Date  . Hip arthroplasty 03/17/2012    Procedure: ARTHROPLASTY BIPOLAR HIP;  Surgeon: Kathryne Hitch, MD;  Location: [facility] OR;  Service: Orthopedics;  Laterality: Right;    History reviewed. No pertinent family history.  Social History:  reports that she has never smoked. She has never used smokeless tobacco. She reports that she does not use illicit drugs. Her alcohol history not on file.  No Known Allergies  Medications:    Prior to Admission:  Prescriptions prior to admission  Medication Sig Dispense Refill  . aspirin EC 81 MG tablet Take 162 mg by mouth daily.       . Cholecalciferol (VITAMIN D PO) Take 1 tablet by mouth every evening.      . metoprolol (LOPRESSOR) 50 MG tablet Take 50 mg by mouth 2 (two)  times daily.       Scheduled:   . aspirin EC  162 mg Oral Daily  . azithromycin  500 mg Oral Daily  . cefTRIAXone (ROCEPHIN)  IV  1 g Intravenous Q24H  . cholecalciferol  1,000 Units Oral Daily  . coumadin book  1 each Does not apply Once  . docusate sodium  100 mg Oral BID  . feeding supplement  1 Container Oral Q1400  . ferric gluconate (FERRLECIT/NULECIT) IV  125 mg Intravenous Once  . ferrous sulfate  325 mg Oral TID WC  . lactulose  10 g Oral Q6H  . metoprolol  50 mg Oral BID  . senna  1 tablet Oral BID  . warfarin  2.5 mg Oral ONCE-1800  . warfarin  1 each Does not apply Once  . Warfarin - Pharmacist Dosing Inpatient   Does not apply q1800    Review of Systems - General ROS: negative for - chills, fatigue, fever or hot flashes Hematological and Lymphatic ROS: negative for - bruising, fatigue, jaundice or pallor Endocrine ROS: negative for - hair pattern changes, hot flashes, mood swings or skin changes Respiratory ROS: negative for - cough, hemoptysis, orthopnea or wheezing Cardiovascular ROS: negative for - dyspnea on exertion, orthopnea, palpitations or shortness of breath Gastrointestinal ROS: negative for - abdominal pain, appetite loss,  blood in stools, diarrhea or hematemesis Musculoskeletal ROS: negative for - joint pain, joint stiffness, joint swelling or muscle pain Neurological ROS: positive for - speech problems Dermatological ROS: negative for dry skin, pruritus and rash   Blood pressure 132/49, pulse 89, temperature 97.3 F (36.3 C), temperature source Oral, resp. rate 16, height 5\' 4"  (1.626 m), weight 52.164 kg (115 lb), SpO2 93.00%.   Neurologic Examination:  Mental Status: Alert, oriented, thought content appropriate.  Speech fluent without evidence of aphasia.  Able to follow 3 step commands without difficulty. Cranial Nerves: II: Discs flat bilaterally; Visual fields grossly normal, pupils equal, round, reactive to light and accommodation III,IV, VI:  ptosis not present, extra-ocular motions intact bilaterally V,VII: smile asymmetric on the left, facial light touch sensation normal bilaterally VIII: hearing decreased bilaterally IX,X: gag reflex present XI: bilateral shoulder shrug XII: midline tongue extension Motor: Right : Upper extremity   3/5    Left:     Upper extremity   3/5 with drift  Lower extremity   2/5  (THA)    Lower extremity   3/5 with drift Tone and bulk:normal tone throughout; no atrophy noted Sensory: Pinprick and light touch intact throughout, bilaterally Deep Tendon Reflexes: 1+ and symmetric throughout Plantars: Right: downgoing   Left: upgoing Cerebellar: normal finger-to-nose,  CV: pulses palpable throughout      No results found for this basename: cbc, bmp, coags, chol, tri, ldl, hga1c    Results for orders placed during the hospital encounter of 03/17/12 (from the past 48 hour(s))  PROTIME-INR     Status: Abnormal   Collection Time   03/21/12  5:50 AM      Component Value Range Comment   Prothrombin Time 18.1 (*) 11.6 - 15.2 seconds    INR 1.55 (*) 0.00 - 1.49   CBC     Status: Abnormal   Collection Time   03/21/12  5:50 AM      Component Value Range Comment   WBC 6.2  4.0 - 10.5 K/uL    RBC 3.45 (*) 3.87 - 5.11 MIL/uL    Hemoglobin 9.3 (*) 12.0 - 15.0 g/dL    HCT 14.7 (*) 82.9 - 46.0 %    MCV 82.0  78.0 - 100.0 fL    MCH 27.0  26.0 - 34.0 pg    MCHC 32.9  30.0 - 36.0 g/dL    RDW 56.2 (*) 13.0 - 15.5 %    Platelets 160  150 - 400 K/uL   BASIC METABOLIC PANEL     Status: Abnormal   Collection Time   03/21/12  5:50 AM      Component Value Range Comment   Sodium 133 (*) 135 - 145 mEq/L    Potassium 4.0  3.5 - 5.1 mEq/L    Chloride 99  96 - 112 mEq/L    CO2 27  19 - 32 mEq/L    Glucose, Bld 120 (*) 70 - 99 mg/dL    BUN 14  6 - 23 mg/dL    Creatinine, Ser 8.65  0.50 - 1.10 mg/dL    Calcium 8.7  8.4 - 78.4 mg/dL    GFR calc non Af Amer 74 (*) >90 mL/min    GFR calc Af Amer 86 (*) >90 mL/min    IRON AND TIBC     Status: Abnormal   Collection Time   03/21/12  5:50 AM      Component Value Range Comment   Iron 12 (*) 42 -  135 ug/dL    TIBC 161 (*) 096 - 045 ug/dL    Saturation Ratios 5 (*) 20 - 55 %    UIBC 232  125 - 400 ug/dL   FERRITIN     Status: Normal   Collection Time   03/21/12  5:50 AM      Component Value Range Comment   Ferritin 38  10 - 291 ng/mL   PROTIME-INR     Status: Abnormal   Collection Time   03/22/12  7:50 AM      Component Value Range Comment   Prothrombin Time 18.6 (*) 11.6 - 15.2 seconds    INR 1.61 (*) 0.00 - 1.49   CBC     Status: Abnormal   Collection Time   03/22/12  7:50 AM      Component Value Range Comment   WBC 6.1  4.0 - 10.5 K/uL    RBC 3.69 (*) 3.87 - 5.11 MIL/uL    Hemoglobin 10.0 (*) 12.0 - 15.0 g/dL    HCT 40.9 (*) 81.1 - 46.0 %    MCV 83.2  78.0 - 100.0 fL    MCH 27.1  26.0 - 34.0 pg    MCHC 32.6  30.0 - 36.0 g/dL    RDW 91.4 (*) 78.2 - 15.5 %    Platelets 218  150 - 400 K/uL DELTA CHECK NOTED  BASIC METABOLIC PANEL     Status: Abnormal   Collection Time   03/22/12  7:50 AM      Component Value Range Comment   Sodium 136  135 - 145 mEq/L    Potassium 4.1  3.5 - 5.1 mEq/L    Chloride 101  96 - 112 mEq/L    CO2 30  19 - 32 mEq/L    Glucose, Bld 114 (*) 70 - 99 mg/dL    BUN 15  6 - 23 mg/dL    Creatinine, Ser 9.56  0.50 - 1.10 mg/dL    Calcium 8.7  8.4 - 21.3 mg/dL    GFR calc non Af Amer 76 (*) >90 mL/min    GFR calc Af Amer 89 (*) >90 mL/min        Assessment/Plan:    78 YO female with new left facial droop, left arm and leg weakness in setting of recent right total hip arthroplasty and community-acquired pneumonia.  Given exam of new facial droop, left arm and leg weakness likely new acute right hemispheric infarct.   Recommend: 1) MRI/MRA brain 2) Carotid Doppler 3) Echo 4) NPO until SLP 5) Continue with ASA rectally until seen by SLP 6) A1c and FLP 7) PT/OT    Felicie Morn PA-C [facility]uro[facility] [phone]  03/22/2012, 10:43 AM

## 2012-03-22 NOTE — Progress Notes (Signed)
PT Cancellation Note  Treatment cancelled today due to medical issues with patient which prohibited therapy; patient currently being seen by neuro for consult on possible acute stroke.  Will follow up tomorrow.  Fabio Asa 03/22/2012, 12:39 PM Charlotte Crumb, PT DPT  [phone]

## 2012-03-22 NOTE — Progress Notes (Signed)
Patient ID: Holly Allen, female   DOB: 07/01/1914, 78 y.o.   MRN: [id number] No new issues with her right hip.  Her dressing is clean and dry.  Her leg lengths are equal and she will move her right leg actively.  She is currently being worked up, according to her family, for the possibility of having had a stroke.  If that is the case, then blood thinners should be held until further notice.

## 2012-03-22 NOTE — Evaluation (Signed)
SLP has reviewed and agrees with student's note below.  Emaad Nanna Willis Mujahid Jalomo M.Ed CCC-SLP Pager [phone]  03/22/2012  

## 2012-03-22 NOTE — Progress Notes (Signed)
I was approached later this morning by the patient's son who is concerned of the patient's slurred speech. I want to evaluate the patient. The patient was alert and oriented although a little slow to respond. She was able to answer appropriately. There was no slurred speech. The patient did voice some weakness in her left upper extremity and left lower extremity. This was corroborated by the patient's son at the bedside. The son was concerned that she may have had a new stroke.  Temperature 97.3/pulse 89/respirations 16/blood pressure 132/49 Patient able to move all 4 extremities spontaneously. Strength in right upper extremity4/5, left upper extremity4-/5. Right lower extremity limited motion due to a recent right hip arthroplasty, left lower extremity4-/5; no dysmetria. Extraocular muscles intact, no tongue deviation, no facial droop, palate rises symmetrically.  CV: Regular rate and rhythm, no rubs or gallops Lung: Right basilar crackles, left clear to auscultation. No wheezes. Good air movement. Abdomen: Soft, nontender, positive bowel sounds, nondistended. I have personally called Dr. Roseanne Reno from the neurology service to request a consultation. The patient's discharge this morning has been canceled. I updated the patient's son at the bedside.  Also spoke with neurology PA to clarify the situation.

## 2012-03-23 ENCOUNTER — Inpatient Hospital Stay (HOSPITAL_COMMUNITY): Payer: Medicare Other

## 2012-03-23 DIAGNOSIS — I63511 Cerebral infarction due to unspecified occlusion or stenosis of right middle cerebral artery: Secondary | ICD-10-CM

## 2012-03-23 LAB — CBC
HCT: 28.9 % — ABNORMAL LOW (ref 36.0–46.0)
Platelets: 214 10*3/uL (ref 150–400)
RBC: 3.46 MIL/uL — ABNORMAL LOW (ref 3.87–5.11)
RDW: 17.5 % — ABNORMAL HIGH (ref 11.5–15.5)
WBC: 5 10*3/uL (ref 4.0–10.5)

## 2012-03-23 LAB — BASIC METABOLIC PANEL
CO2: 26 mEq/L (ref 19–32)
Chloride: 104 mEq/L (ref 96–112)
GFR calc Af Amer: >90 mL/min (ref >90)
Potassium: 3.6 mEq/L (ref 3.5–5.1)

## 2012-03-23 LAB — LIPID PANEL
Cholesterol: 129 mg/dL (ref 0–200)
HDL: 26 mg/dL — ABNORMAL LOW (ref >39)
Total CHOL/HDL Ratio: 5 RATIO

## 2012-03-23 LAB — PROTIME-INR: INR: 1.47 (ref 0.00–1.49)

## 2012-03-23 MED ORDER — CLOPIDOGREL BISULFATE 75 MG PO TABS
75.0000 mg | ORAL_TABLET | Freq: Every day | ORAL | Status: DC
  Administered 2012-03-23: 75 mg via ORAL
  Filled 2012-03-23 (×3): qty 1

## 2012-03-23 MED ORDER — SIMVASTATIN 10 MG PO TABS
10.0000 mg | ORAL_TABLET | Freq: Every day | ORAL | Status: DC
  Administered 2012-03-23: 10 mg via ORAL
  Filled 2012-03-23 (×2): qty 1

## 2012-03-23 MED ORDER — WHITE PETROLATUM GEL
Status: AC
  Filled 2012-03-23: qty 5

## 2012-03-23 NOTE — Clinical Social Work Note (Signed)
Clinical Social Worker continuing to follow patient for discharge needs.  Bed remains available at [facility], however per MD patient remains medically unstable for discharge.  CSW spoke with facility who would just like an update on patient discharge plan once medically ready.  CSW will continue to follow for support and to facilitate patient discharge needs once medically stable.  Macario Golds, LCSW [phone]  ([address] [place], [place] [phone])

## 2012-03-23 NOTE — Progress Notes (Signed)
Speech Language Pathology Dysphagia Treatment Patient Details Name: Holly Allen MRN: [id number] DOB: 1915-03-29 Today's Date: 03/23/2012 Time: 1150-1205 SLP Time Calculation (min): 15 min  Assessment / Plan / Recommendation Clinical Impression  Pt. observed with portion of regular texture lunch tray and thin liquids.  Min left labial residue and that required min verbal cue for pt. increase awareness and remove.  Mastication with BBQ sandwich is slower, however not too decreased from baseline.  Recommend she continue regular texture and thin liquids.  Reiterated with family and pt. importance of checking and removing left labial residue during and after meals.  No further ST required.    Diet Recommendation  Continue with Current Diet: Regular;Thin liquid    SLP Plan All goals met;Discharge SLP treatment due to (comment) (goals met)       Swallowing Goals  SLP Swallowing Goals Patient will consume recommended diet without observed clinical signs of aspiration with: Minimal cueing Swallow Study Goal #1 - Progress: Met Patient will utilize recommended strategies during swallow to increase swallowing safety with: Minimal cueing Swallow Study Goal #2 - Progress: Met  General Temperature Spikes Noted: No Behavior/Cognition: Alert;Cooperative;Pleasant mood Oral Cavity - Dentition: Adequate natural dentition (missing several on left upper) Patient Positioning: Upright in chair  Oral Cavity - Oral Hygiene Brush patient's teeth BID with toothbrush (using toothpaste with fluoride): Yes Patient is AT RISK - Oral Care Protocol followed (see row info): Yes   Dysphagia Treatment Treatment focused on: Skilled observation of diet tolerance;Patient/family/caregiver education Treatment Methods/Modalities: Skilled observation Patient observed directly with PO's: Yes Type of PO's observed: Regular;Thin liquids Feeding: Able to feed self Liquids provided via: Straw Oral Phase Signs & Symptoms:  Left pocketing;Anterior loss/spillage Type of cueing: Verbal;Visual Amount of cueing: Minimal      Royce Macadamia M.Ed [employer] [phone]  03/23/2012

## 2012-03-23 NOTE — Progress Notes (Signed)
Physical Therapy Treatment Patient Details Name: Holly Allen MRN: [id number] DOB: 1914-08-19 Today's Date: 03/23/2012 Time: 1020-1049 PT Time Calculation (min): 29 min  PT Assessment / Plan / Recommendation Comments on Treatment Session  Increased tx time due to pt using [facility] & requiring assist for pericare.  Pt motivated & willing to participate in PT session.  Progressing with mobility at this date.      Follow Up Recommendations  Skilled nursing facility;Supervision/Assistance - 24 hour    Barriers to Discharge        Equipment Recommendations  Defer to next venue    Recommendations for Other Services    Frequency Min 3X/week   Plan Discharge plan remains appropriate    Precautions / Restrictions Precautions Precautions: Anterior Hip Precaution Comments: Educated pt and family on precations. Restrictions Weight Bearing Restrictions: No LLE Weight Bearing: Weight bearing as tolerated       Mobility  Bed Mobility Bed Mobility: Supine to Sit;Sitting - Scoot to Edge of Bed Supine to Sit: 3: Mod assist;With rails;HOB elevated Sitting - Scoot to Edge of Bed: 4: Min assist Details for Bed Mobility Assistance: Assist for RLE & to lift shoulders/trunk to sitting upright.  Cues for sequencing & technique.   Transfers Transfers: Sit to Stand;Stand to Sit Sit to Stand: With upper extremity assist;With armrests;From bed;From chair/3-in-1;3: Mod assist Stand to Sit: 4: Min assist;With upper extremity assist;With armrests;To chair/3-in-1 Details for Transfer Assistance: cues for hand placement & technique.  Assist to achieve standing, balance, tall posture with facilitation at hips & shoulders  Ambulation/Gait Ambulation/Gait Assistance: 3: Mod assist (+2 for safety to follow with recliner) Ambulation Distance (Feet): 15 Feet Assistive device: Rolling walker Ambulation/Gait Assistance Details: Cues for sequencing, RW advancement, tall posture, & technique.  Assist for balance,  advancement of RW,  & safety.     Gait Pattern: Step-through pattern;Decreased weight shift to right;Antalgic;Trunk flexed;Narrow base of support Stairs: No      PT Goals Acute Rehab PT Goals Time For Goal Achievement: 04/01/12 Potential to Achieve Goals: Fair PT Goal: Supine/Side to Sit - Progress: Progressing toward goal PT Goal: Sit at Edge Of Bed - Progress: Progressing toward goal PT Goal: Sit to Stand - Progress: Progressing toward goal PT Goal: Stand to Sit - Progress: Progressing toward goal PT Transfer Goal: Bed to Chair/Chair to Bed - Progress: Progressing toward goal PT Goal: Ambulate - Progress: Progressing toward goal  Visit Information  Last PT Received On: 03/23/12 Assistance Needed: +1    Subjective Data      Cognition  Overall Cognitive Status: Impaired Area of Impairment: Memory Arousal/Alertness: Awake/alert Orientation Level: Oriented X4 / Intact Behavior During Session: [facility] for tasks performed Memory: Decreased recall of precautions    Balance     End of Session PT - End of Session Equipment Utilized During Treatment: Gait belt Activity Tolerance: Patient tolerated treatment well Patient left: in chair;with call bell/phone within reach;with family/visitor present Nurse Communication: Mobility status     Verdell Face, [place] [phone] 03/23/2012

## 2012-03-23 NOTE — Progress Notes (Signed)
Speech Language Pathology Discharge Patient Details Name: Holly Allen MRN: [id number] DOB: 01-24-1915 Today's Date: 03/23/2012 Time: 4098-1191 SLP Time Calculation (min): 15 min  Patient discharged from SLP services secondary to goals met and no further SLP needs identified.  Please see latest therapy progress note for current level of functioning and progress toward goals.    Progress and discharge plan discussed with patient and/or caregiver: Patient/Caregiver agrees with plan  [name] 03/23/2012, 12:46 PM

## 2012-03-23 NOTE — Progress Notes (Signed)
Nutrition Follow-up  Intervention:   No further interventions at this time.  Continue current management of [employer] daily. Continue bowel regimen.  Assessment:   Pt currently in MRI due to ongoing concerns for stroke.  Pt is 5 days post-op hip fx repair. Pt has been consuming 40-50% of meals and 1 Resource Breeze daily. Intake has remained consistent despite possible stroke and left-sided weakness.  Pt evaluated by SLP who determine pt appropriate for Regular, thin liquids. Pt with first documented BM since admission today- currently on senokot and colace.    Diet Order:  Regular, thin  Meds: Scheduled Meds:   . azithromycin  500 mg Oral Daily  . cefTRIAXone (ROCEPHIN)  IV  1 g Intravenous Q24H  . chlorhexidine  15 mL Mouth/Throat BID  . cholecalciferol  1,000 Units Oral Daily  . clopidogrel  75 mg Oral Q breakfast  . coumadin book  1 each Does not apply Once  . docusate sodium  100 mg Oral BID  . feeding supplement  1 Container Oral Q1400  . ferrous sulfate  325 mg Oral TID WC  . lactulose  10 g Oral Q6H  . metoprolol  50 mg Oral BID  . senna  1 tablet Oral BID  . simvastatin  10 mg Oral q1800  . warfarin  1 each Does not apply Once  . DISCONTD: aspirin  150 mg Rectal Daily   Continuous Infusions:  PRN Meds:.acetaminophen, HYDROcodone-acetaminophen, methocarbamol, morphine injection, oxyCODONE, traMADol  Labs:  CMP     Component Value Date/Time   NA 138 03/23/2012 0500   K 3.6 03/23/2012 0500   CL 104 03/23/2012 0500   CO2 26 03/23/2012 0500   GLUCOSE 116* 03/23/2012 0500   BUN 12 03/23/2012 0500   CREATININE 0.51 03/23/2012 0500   CALCIUM 8.7 03/23/2012 0500   PROT 6.5 03/17/2012 1321   ALBUMIN 3.2* 03/17/2012 1321   AST 19 03/17/2012 1321   ALT 9 03/17/2012 1321   ALKPHOS 105 03/17/2012 1321   BILITOT 0.4 03/17/2012 1321   GFRNONAA 80* 03/23/2012 0500   GFRAA >90 03/23/2012 0500     Intake/Output Summary (Last 24 hours) at 03/23/12 1614 Last data filed at 03/23/12  1430  Gross per 24 hour  Intake      0 ml  Output      2 ml  Net     -2 ml  1 BM today- first documented BM since admission.   Weight Status:  No new wt  Re-estimated needs:  1450-1560 kcal, 63-72g protein, ~1.6 L/day  Nutrition Dx:  Increased nutrient needs, ongoing   Monitor:  1. Food/Beverage; diet advancement with tolerance, pt eating per her usual.  Met, continue.   Loyce Dys, MS RD LDN Clinical Inpatient Dietitian Pager: [phone] Weekend/After hours pager: [phone]

## 2012-03-23 NOTE — Progress Notes (Signed)
[facility] PROGRESS NOTE  Holly Allen ZOX:[id number] DOB: 11/09/1914 DOA: 03/17/2012 PCP: Sheila Oats, MD  Assessment/Plan: Acute ischemic stroke--cannot rule out embolic source -Transthoracic echo negative for intracardiac thrombi -Patient was already started on warfarin status post right hip arthroplasty -Will defer final decision on warfarin to neurology -Discontinue aspirin, start Plavix -Due to acute stroke and LDL, start simvastatin 10mg  -Given the patient's age and hemoglobin A1c 6.1, will allow liberal glycemic control at this time. Patient had no prior diagnosis of diabetes mellitus -MRA brain (9/23) shows moderate to severe right MCA stenosis but no carotid stenosis -Carotid ultrasound negative for stenosis -Continue PT/OT Right femoral neck fracture  -Secondary to mechanical fall  -s/p right hip arthroplasty September 18  -Acetaminophen/tramadol for pain  -Family desires minimal medications for pain as they expressed concerns about her confusion in the past Thrombocytopenia  -improvement today  -Monitor closely  -Suspect from Coumadin versus Lovenox  -Discontinue Lovenox, start SCDs  Hypertension  -Controlled -Continue home dose of metoprolol tartrate  History of stroke, ischemic cerebellum (2007)  Leukocytosis  -improved  Iron Deficiency Anemia  -ferric gluconate given during hospitalization  -Continue ferrous sulfate 3 times a day x1 month, then twice a day indefinitely  -Transfused 2 units packed red blood cells (9/20)  Tachycardia  -Improved with packed red blood cells  -Patient was asymptomatic  -May be related to drop in hemoglobin  Community acquired pneumonia  -ceftriaxone and azithromycin (9/20)  -Omnicef and azithromycin at D/C, stop after September 27 doses    Family Communication:   Pt at beside Disposition Plan:   To skilled nursing facility when cleared by neurology       Procedures/Studies: Dg Chest 1 View  03/17/2012   *RADIOLOGY REPORT*  Clinical Data: Right hip fracture, fall  CHEST - 1 VIEW  Comparison: 04/10/2010  Findings: Upper normal heart size. Calcified tortuous aorta. Enlarged central pulmonary arteries, likely accounting for bilateral hilar prominence, greater on the right. Lungs appear emphysematous with chronic accentuation of interstitial markings at mid-to-lower lungs. No definite infiltrate, pleural effusion or pneumothorax. Marked osseous demineralization. Stable sclerotic focus in a mid thoracic vertebra.  IMPRESSION: Emphysematous and chronic interstitial lung disease changes. Question pulmonary arterial hypertension. No acute abnormalities.   Original Report Authenticated By: Lollie Marrow, M.D.    Dg Hip Complete Right  03/17/2012  *RADIOLOGY REPORT*  Clinical Data: Right hip pain post fall  RIGHT HIP - COMPLETE 2+ VIEW  Comparison: None  Findings: Osseous demineralization. Displaced right femoral neck fracture with overriding. No dislocation or additional fractures identified. Mild narrowing of left hip joint with preservation of right hip joint space. Pelvis appears intact.  IMPRESSION: Displaced right femoral neck fracture.   Original Report Authenticated By: Lollie Marrow, M.D.    Ct Head Wo Contrast  03/22/2012  *RADIOLOGY REPORT*  Clinical Data: Left arm and leg weakness since this morning.  CT HEAD WITHOUT CONTRAST  Technique:  Contiguous axial images were obtained from the base of the skull through the vertex without contrast.  Comparison: Most recent CT 12/19/2005.  Findings: There is no evidence for acute infarction, intracranial hemorrhage, mass lesion, hydrocephalus, or extra-axial fluid.  Mild to moderate atrophy, not unexpected for the patient's age of 38. Chronic microvascular ischemic change affects the periventricular subcortical white matter, also likely age related.  Dystrophic calcification left periventricular white matter is stable from priors.  Moderate central vascular  calcification affects the carotid arteries and their skull base segments.  The calvarium is intact.  Bilateral cataract extraction.  No acute sinus or mastoid disease.  IMPRESSION: Chronic changes as described.  No CT evidence for acute infarction or intracranial hemorrhage.   Original Report Authenticated By: Elsie Stain, M.D.    Mr [facility] Wo Contrast  03/23/2012  *RADIOLOGY REPORT*  Clinical Data:  Stroke.  Fall  MRI HEAD WITHOUT CONTRAST MRA HEAD WITHOUT CONTRAST  Technique:  Multiplanar, multiecho pulse sequences of the brain and surrounding structures were obtained without intravenous contrast. Angiographic images of the head were obtained using MRA technique without contrast.  Comparison:  CT 03/22/2012, MRI 12/20/2005  MRI HEAD  Findings:  Multiple small areas of acute infarct involving the right posterior insula and right frontal parietal white matter. Small acute infarct left frontal white matter.  No other acute infarct.  Generalized atrophy which is age appropriate.  Chronic microvascular ischemic changes in the white matter.  Chronic ischemia in the thalami and basal ganglia bilaterally.  Chronic small infarcts in the cerebellum bilaterally.  Brainstem is intact. No hemorrhage or mass lesion.  Mucosal edema right maxillary sinus.  IMPRESSION: Multiple small areas of acute infarct bilaterally.  This could be due to emboli given the bilateral MCA distribution.  No associated hemorrhage.  Chronic microvascular ischemic changes, most notable in the cerebellum bilaterally.  MRA HEAD  Findings: Left vertebral artery is widely patent to the basilar. Right vertebral artery is hypoplastic distally but does contribute to the basilar.  PICA is patent bilaterally.  Basilar is widely patent superior cerebellar and posterior cerebral arteries are patent bilaterally.  Fetal origin of the right posterior cerebral artery.  Internal carotid artery is widely patent bilaterally without stenosis.  Anterior and  middle cerebral arteries are patent bilaterally.  There is a moderate to severe stenosis at the origin of the temporal branch of the right middle cerebral artery.  IMPRESSION: Moderate to severe stenosis involving the origin of the temporal division of the right middle cerebral artery.  This may account for the right MCA infarct.   Original Report Authenticated By: Camelia Phenes, M.D.    Mr Brain Wo Contrast  03/23/2012  *RADIOLOGY REPORT*  Clinical Data:  Stroke.  Fall  MRI HEAD WITHOUT CONTRAST MRA HEAD WITHOUT CONTRAST  Technique:  Multiplanar, multiecho pulse sequences of the brain and surrounding structures were obtained without intravenous contrast. Angiographic images of the head were obtained using MRA technique without contrast.  Comparison:  CT 03/22/2012, MRI 12/20/2005  MRI HEAD  Findings:  Multiple small areas of acute infarct involving the right posterior insula and right frontal parietal white matter. Small acute infarct left frontal white matter.  No other acute infarct.  Generalized atrophy which is age appropriate.  Chronic microvascular ischemic changes in the white matter.  Chronic ischemia in the thalami and basal ganglia bilaterally.  Chronic small infarcts in the cerebellum bilaterally.  Brainstem is intact. No hemorrhage or mass lesion.  Mucosal edema right maxillary sinus.  IMPRESSION: Multiple small areas of acute infarct bilaterally.  This could be due to emboli given the bilateral MCA distribution.  No associated hemorrhage.  Chronic microvascular ischemic changes, most notable in the cerebellum bilaterally.  MRA HEAD  Findings: Left vertebral artery is widely patent to the basilar. Right vertebral artery is hypoplastic distally but does contribute to the basilar.  PICA is patent bilaterally.  Basilar is widely patent superior cerebellar and posterior cerebral arteries are patent bilaterally.  Fetal origin of the right posterior cerebral artery.  Internal carotid artery is widely  patent bilaterally without stenosis.  Anterior and middle cerebral arteries are patent bilaterally.  There is a moderate to severe stenosis at the origin of the temporal branch of the right middle cerebral artery.  IMPRESSION: Moderate to severe stenosis involving the origin of the temporal division of the right middle cerebral artery.  This may account for the right MCA infarct.   Original Report Authenticated By: Camelia Phenes, M.D.    Dg Pelvis Portable  03/17/2012  *RADIOLOGY REPORT*  Clinical Data: Hip replacement.  PORTABLE PELVIS  Comparison: Plain films 03/17/2012 at 1357 hours.  Findings: The patient has a new bipolar right hip hemiarthroplasty with three cerclage wires.  The device is located.  No acute fracture is seen.  Surgical staples and gas in the soft tissues noted.  IMPRESSION: Right hip replacement without evidence of complication.   Original Report Authenticated By: Bernadene Bell. Maricela Curet, M.D.    Dg Chest Port 1 View  03/19/2012  *RADIOLOGY REPORT*  Clinical Data: Hypoxia  PORTABLE CHEST - 1 VIEW  Comparison: March 17, 2012  Findings: There is a new small area of pneumonitis in the right base. Elsewhere the lungs are clear.  There is a nipple shadow on the left.  There is underlying emphysema with pulmonary arterial hypertension. No adenopathy.  Aorta is tortuous with atherosclerotic change, stable.  Bones are osteoporotic.  IMPRESSION: New area of patchy infiltrate right base.  Underlying emphysema with pulmonary arterial hypertension.  Stable aortic tortuosity.   Original Report Authenticated By: Arvin Collard. Gaspar Garbe, M.D.    Dg Hip Portable 1 View Right  03/17/2012  *RADIOLOGY REPORT*  Clinical Data: Hip replacement.  PORTABLE RIGHT HIP - 1 VIEW  Comparison: Plain films 03/17/2012 1357 hours.  Findings: New bipolar right hip hemiarthroplasty with 3 cerclage wire is in place.  Device is located and there is no fracture.  Gas in the soft tissues and surgical staples noted.   IMPRESSION: Right hip replacement without evidence of complication.   Original Report Authenticated By: Bernadene Bell. Maricela Curet, M.D.          Subjective: Patient is doing better. She is in better spirits today. Left upper extremity and left lower extremity weakness improved from yesterday. No further slurred speech. Denies any dizziness, chest pain, shortness of breath, vomiting, diarrhea, abdominal pain  Objective: Filed Vitals:   03/23/12 0400 03/23/12 0530 03/23/12 0800 03/23/12 1128  BP:  135/65    Pulse:  84    Temp:  97.3 F (36.3 C)    TempSrc:      Resp: 16 16 16 16   Height:      Weight:      SpO2: 99% 99% 99% 99%    Intake/Output Summary (Last 24 hours) at 03/23/12 1143 Last data filed at 03/22/12 1440  Gross per 24 hour  Intake    240 ml  Output      0 ml  Net    240 ml   Weight change:  Exam:   General:  Pt is alert, follows commands appropriately, not in acute distress  HEENT: No icterus, No thrush, No neck mass, [place]/AT  Cardiovascular: RRR, S1/S2, no rubs, no gallops  Respiratory: Bibasilar crackles, left clear to auscultation  Abdomen: Soft/+BS, non tender, non distended, no guarding  Extremities: No edema, No lymphangitis, No petechiae, No rashes, no synovitis  Data Reviewed: Basic Metabolic Panel:  Lab 03/23/12 1308 03/22/12 0750 03/21/12 0550 03/20/12 0510 03/19/12 0610  NA 138 136 133* 135 131*  K 3.6 4.1 4.0 4.2 4.4  CL 104 101 99 102 98  CO2 26 30 27 26 25   GLUCOSE 116* 114* 120* 114* 138*  BUN 12 15 14 18  25*  CREATININE 0.51 0.58 0.63 0.74 0.89  CALCIUM 8.7 8.7 8.7 8.4 8.6  MG -- -- -- -- --  PHOS -- -- -- -- --   Liver Function Tests:  Lab 03/17/12 1321  AST 19  ALT 9  ALKPHOS 105  BILITOT 0.4  PROT 6.5  ALBUMIN 3.2*   No results found for this basename: LIPASE:5,AMYLASE:5 in the last 168 hours No results found for this basename: AMMONIA:5 in the last 168 hours CBC:  Lab 03/23/12 0500 03/22/12 0750 03/21/12 0550 03/20/12  0510 03/19/12 0610 03/17/12 1321  WBC 5.0 6.1 6.2 6.3 8.1 --  NEUTROABS -- -- -- -- -- 15.2*  HGB 9.3* 10.0* 9.3* 8.8* 7.0* --  HCT 28.9* 30.7* 28.3* 26.8* 21.5* --  MCV 83.5 83.2 82.0 81.5 77.9* --  PLT 214 218 160 137* 162 --   Cardiac Enzymes: No results found for this basename: CKTOTAL:5,CKMB:5,CKMBINDEX:5,TROPONINI:5 in the last 168 hours BNP: No components found with this basename: POCBNP:5 CBG: No results found for this basename: GLUCAP:5 in the last 168 hours  Recent Results (from the past 240 hour(s))  CULTURE, BLOOD (ROUTINE X 2)     Status: Normal (Preliminary result)   Collection Time   03/19/12  9:19 PM      Component Value Range Status Comment   Specimen Description BLOOD RIGHT ARM   Final    Special Requests BOTTLES DRAWN AEROBIC AND ANAEROBIC 10CC   Final    Culture  Setup Time 03/20/2012 02:41   Final    Culture     Final    Value:        BLOOD CULTURE RECEIVED NO GROWTH TO DATE CULTURE WILL BE HELD FOR 5 DAYS BEFORE ISSUING A FINAL NEGATIVE REPORT   Report Status PENDING   Incomplete   CULTURE, BLOOD (ROUTINE X 2)     Status: Normal (Preliminary result)   Collection Time   03/19/12  9:23 PM      Component Value Range Status Comment   Specimen Description BLOOD RIGHT ARM   Final    Special Requests BOTTLES DRAWN AEROBIC AND ANAEROBIC 10CC   Final    Culture  Setup Time 03/20/2012 02:41   Final    Culture     Final    Value:        BLOOD CULTURE RECEIVED NO GROWTH TO DATE CULTURE WILL BE HELD FOR 5 DAYS BEFORE ISSUING A FINAL NEGATIVE REPORT   Report Status PENDING   Incomplete      Scheduled Meds:   . azithromycin  500 mg Oral Daily  . cefTRIAXone (ROCEPHIN)  IV  1 g Intravenous Q24H  . chlorhexidine  15 mL Mouth/Throat BID  . cholecalciferol  1,000 Units Oral Daily  . clopidogrel  75 mg Oral Q breakfast  . coumadin book  1 each Does not apply Once  . docusate sodium  100 mg Oral BID  . feeding supplement  1 Container Oral Q1400  . ferrous sulfate  325  mg Oral TID WC  . lactulose  10 g Oral Q6H  . metoprolol  50 mg Oral BID  . senna  1 tablet Oral BID  . simvastatin  10 mg Oral q1800  . warfarin  1 each Does not apply Once  . DISCONTD: aspirin  150 mg Rectal Daily  . DISCONTD: Warfarin - Pharmacist Dosing Inpatient   Does not apply q1800   Continuous Infusions:    Kumiko Fishman, DO  [facility] Pager [phone]  If 7PM-7AM, please contact night-coverage [email or web address] Password [facility] 03/23/2012, 11:43 AM   LOS: 6 days

## 2012-03-24 LAB — CBC
HCT: 29.6 % — ABNORMAL LOW (ref 36.0–46.0)
MCH: 27.2 pg (ref 26.0–34.0)
MCHC: 32.4 g/dL (ref 30.0–36.0)
MCV: 83.9 fL (ref 78.0–100.0)
RDW: 17.7 % — ABNORMAL HIGH (ref 11.5–15.5)

## 2012-03-24 LAB — BASIC METABOLIC PANEL
BUN: 11 mg/dL (ref 6–23)
CO2: 27 mEq/L (ref 19–32)
Chloride: 100 mEq/L (ref 96–112)
Creatinine, Ser: 0.58 mg/dL (ref 0.50–1.10)
Glucose, Bld: 112 mg/dL — ABNORMAL HIGH (ref 70–99)

## 2012-03-24 MED ORDER — TRAMADOL HCL 50 MG PO TABS: 50.0000 mg | ORAL_TABLET | Freq: Three times a day (TID) | ORAL | Status: DC | PRN

## 2012-03-24 MED ORDER — WARFARIN - PHARMACIST DOSING INPATIENT: Freq: Every day | Status: DC

## 2012-03-24 MED ORDER — WARFARIN SODIUM 2.5 MG PO TABS: 2.5000 mg | ORAL_TABLET | Freq: Every day | ORAL | Status: DC

## 2012-03-24 MED ORDER — HYDROCODONE-ACETAMINOPHEN 5-325 MG PO TABS: 1.0000 | ORAL_TABLET | Freq: Three times a day (TID) | ORAL | Status: DC | PRN

## 2012-03-24 MED ORDER — AZITHROMYCIN 500 MG PO TABS: 500.0000 mg | ORAL_TABLET | Freq: Every day | ORAL | Status: DC

## 2012-03-24 MED ORDER — BOOST / RESOURCE BREEZE PO LIQD: 1.0000 | Freq: Every day | ORAL | Status: DC

## 2012-03-24 MED ORDER — SIMVASTATIN 10 MG PO TABS: 10.0000 mg | ORAL_TABLET | Freq: Every day | ORAL | Status: DC

## 2012-03-24 MED ORDER — CEFUROXIME AXETIL 500 MG PO TABS: 500.0000 mg | ORAL_TABLET | Freq: Two times a day (BID) | ORAL | Status: DC

## 2012-03-24 NOTE — Clinical Social Work Placement (Addendum)
    Clinical Social Work Department CLINICAL SOCIAL WORK PLACEMENT NOTE 03/24/2012  Patient:  Holly Allen, Holly Allen  Account Number:  [id number] Admit date:  03/17/2012  Clinical Social Worker:  Lupita Leash Lenin Kuhnle, LCSWA  Date/time:  03/19/2012 05:30 PM  Clinical Social Work is seeking post-discharge placement for this patient at the following level of care:   SKILLED NURSING   (*CSW will update this form in Epic as items are completed)   03/19/2012  Patient/family provided with [facility] Department of Clinical Social Work's list of facilities offering this level of care within the geographic area requested by the patient (or if unable, by the patient's family).  03/19/2012  Patient/family informed of their freedom to choose among providers that offer the needed level of care, that participate in Medicare, Medicaid or managed care program needed by the patient, have an available bed and are willing to accept the patient.  03/19/2012  Patient/family informed of [facility]' ownership interest in [facility], as well as of the fact that they are under no obligation to receive care at this facility.  PASARR submitted to EDS on 03/19/2012 PASARR number received from EDS on 03/19/2012  FL2 transmitted to all facilities in geographic area requested by pt/family on  03/19/2012 FL2 transmitted to all facilities within larger geographic area on   Patient informed that his/her managed care company has contracts with or will negotiate with  certain facilities, including the following:   NA     Patient/family informed of bed offers received: 03/22/12   Patient chooses bed at [facility] Physician recommends and patient chooses bed at  NA  Patient to be transferred to  [facility]  On 03/22/12   Patient to be transferred to facility by Ambulance  [facility])  The following physician request were entered in Epic:   Additional Comments: Patient was d/c today to SNF. Ok per patient and family-  they are very pleased with d/c. Notified SNF and pt's nurse Nettie Elm of d/c plan.

## 2012-03-24 NOTE — Progress Notes (Signed)
OT NOTE  OT attempting treatment and pt/ family reports d/c to [facility]. OT spoke with CM Lupita Leash who verified that ambulance is called and pt is dcing now. OT to defer all future treatment to SNF   Lucile Shutters   OTR/L Pager: [phone] Office: [phone] .

## 2012-03-24 NOTE — Clinical Social Work Psychosocial (Addendum)
    Clinical Social Work Department BRIEF PSYCHOSOCIAL ASSESSMENT 03/24/2012  Patient:  Holly Allen, Holly Allen     Account Number:  [id number]     Admit date:  03/17/2012  Clinical Social Worker:  Tiburcio Pea  Date/Time:  03/19/2012 05:00 PM  Referred by:  Physician  Date Referred:  03/18/2012 Referred for  SNF Placement   Other Referral:   Interview type:  Other - See comment Other interview type:   Daughter- [name]    PSYCHOSOCIAL DATA Living Status:  ALONE Admitted from facility:   Level of care:   Primary support name:  [name]  (c) [phone] Primary support relationship to patient:  CHILD, ADULT Degree of support available:   Very strong support from daughters and son    CURRENT CONCERNS Current Concerns  Post-Acute Placement   Other Concerns:    SOCIAL WORK ASSESSMENT / PLAN Met with patient and her family today to discuss need for short term SNF for rehab. Patient lives alone and family feels that SNF is indicated. Patient is indepedent and is oriented all spheres. During today's visit she was very sleepy.  Discussed bed search process and SNF list provided.  Fl2 to be placed on chart for MD's signature. They are interested in either [facility] or [facility].   Assessment/plan status:  Psychosocial Support/Ongoing Assessment of Needs Other assessment/ plan:   Information/referral to community resources:   SNF list provided to family  Aftercare needs discussed- HH/DME to be arranged by SNF if indicated at d/c from SNF.    PATIENT'S/FAMILY'S RESPONSE TO PLAN OF CARE: Patient and family agree to SNF search- This will be a short term SNF stay. Patient and family are very pleasant and appreciative of CSW's intervention.

## 2012-03-24 NOTE — Progress Notes (Signed)
Utilization review completed. Zohar Maroney, RN, BSN. 

## 2012-03-24 NOTE — Progress Notes (Signed)
ANTICOAGULATION CONSULT - COUMADIN  Pharmacy Consult for Coumadin  HPI: 78 y.o.female admitted with a Hip fracture and a history of CVA without residual deficits who is on Coumadin for VTE prophylaxis post-op.  Coumadin held   Allergies: No Known Allergies  Height/Weight: Height: 5\' 4"  (162.6 cm) Weight: 115 lb (52.164 kg) IBW/kg (Calculated) : 54.7   Vitals: Blood pressure 130/53, pulse 93, temperature 98.1 F (36.7 C), temperature source Oral, resp. rate 18, height 5\' 4"  (1.626 m), weight 115 lb (52.164 kg), SpO2 95.00%.  Current active problems: Active Problems:  Fracture of femoral neck, right  HTN (hypertension)  Hx of stroke without residual deficits  Leukocytosis  Anemia  COPD (chronic obstructive pulmonary disease)  Pneumonia  Iron deficiency anemia   Medical / Surgical History: Diagnosis  Hip arthroplasty  Hypertension   Current Labs:   [facility] 03/22/12 0750 03/21/12 0550 03/20/12 0510  HGB 10.0* 9.3* --  HCT 30.7* 28.3* 26.8*  PLT 218 160 137*  LABPROT 18.6* 18.1* 18.0*  INR 1.61* 1.55* 1.54*  CREATININE 0.58 0.63 0.74   Lab Results  Component Value Date   INR 1.61* 03/22/2012   INR 1.55* 03/21/2012   INR 1.54* 03/20/2012   Estimated Creatinine Clearance: 35.4 ml/min (by C-G formula based on Cr of 0.58).  Pertinent Medication History: Prescriptions prior to admission  Medication Sig Dispense Refill  . Cholecalciferol (VITAMIN D PO) Take 1 tablet by mouth every evening.      . metoprolol (LOPRESSOR) 50 MG tablet Take 50 mg by mouth 2 (two) times daily.      Marland Kitchen DISCONTD: aspirin EC 81 MG tablet Take 162 mg by mouth daily.        Scheduled:     . azithromycin  500 mg Oral Daily  . cefTRIAXone (ROCEPHIN)  IV  1 g Intravenous Q24H  . chlorhexidine  15 mL Mouth/Throat BID  . cholecalciferol  1,000 Units Oral Daily  . coumadin book  1 each Does not apply Once  . docusate sodium  100 mg Oral BID  . feeding supplement  1 Container Oral Q1400  .  ferrous sulfate  325 mg Oral TID WC  . lactulose  10 g Oral Q6H  . metoprolol  50 mg Oral BID  . senna  1 tablet Oral BID  . simvastatin  10 mg Oral q1800  . warfarin  1 each Does not apply Once  . Warfarin - Pharmacist Dosing Inpatient   Does not apply q1800  . white petrolatum      . DISCONTD: clopidogrel  75 mg Oral Q breakfast   Anti-infectives     Start     Dose/Rate Route Frequency Ordered Stop   03/22/12 0000   azithromycin (ZITHROMAX) 500 MG tablet        500 mg Oral Daily 03/22/12 0759     03/22/12 0000   cefdinir (OMNICEF) 300 MG capsule        300 mg Oral 2 times daily 03/22/12 0759     03/20/12 2200   cefTRIAXone (ROCEPHIN) 1 g in dextrose 5 % 50 mL IVPB  Status:  Discontinued        1 g 100 mL/hr over 30 Minutes Intravenous Every 24 hours 03/20/12 2022 03/20/12 2025   03/20/12 2000   cefTRIAXone (ROCEPHIN) 1 g in dextrose 5 % 50 mL IVPB        1 g 100 mL/hr over 30 Minutes Intravenous Every 24 hours 03/20/12 2026     03/19/12  1800   cefTRIAXone (ROCEPHIN) 1 g in dextrose 5 % 50 mL IVPB  Status:  Discontinued        1 g 100 mL/hr over 30 Minutes Intravenous Every 24 hours 03/19/12 1705 03/20/12 2022   03/19/12 1800   azithromycin (ZITHROMAX) tablet 500 mg        500 mg Oral Daily 03/19/12 1705     03/18/12 1000   ceFAZolin (ANCEF) IVPB 1 g/50 mL premix        1 g 100 mL/hr over 30 Minutes Intravenous 3 times per day 03/18/12 0946 03/18/12 1815   03/18/12 0200   ceFAZolin (ANCEF) IVPB 1 g/50 mL premix  Status:  Discontinued        1 g 100 mL/hr over 30 Minutes Intravenous 3 times per day 03/17/12 2304 03/18/12 0946          Assessment: Dr. Thedore Mins has ordered to resume coumadin today for VTE prophylaxis and acute ischemic stroke-cannot rule out embolic source in this 78 y.o female s/p right hip bipolar arthroplasty.  Aspirin and plavix discontinued by Dr. Thedore Mins.  Today INR = 1.23,  Last coumadin dose given on 03/21/12. PLTC 255, H?H 9.6/29.6.  No bleeding  noted.  Patient to be discharged to day on azithromycin which increases coumadin effect.   Goals:  Target INR of 2-3.  Plan:  Patient to be discharged today to SNF. I recommended discharge on Coumadin 2.5 mg daily as discussed verbally with Dr. Thedore Mins.  INR to be checked at SNF 2-3 x/week and adjusted per SNF pharmacy or MD.   Noah Delaine, RPh Clinical Pharmacist Pager: [phone] 03/24/2012, 12:55 PM

## 2012-03-24 NOTE — Progress Notes (Signed)
Subjective: 7 Days Post-Op Procedure(s) (LRB): ARTHROPLASTY BIPOLAR HIP (Right) Patient reports pain as mild.  Slowly improving mobility with PT.  Objective: Vital signs in last 24 hours: Temp:  [98.1 F (36.7 C)-99.1 F (37.3 C)] 98.1 F (36.7 C) (09/25 0532) Pulse Rate:  [72-105] 93  (09/25 0532) Resp:  [16-20] 18  (09/25 0532) BP: (126-130)/(52-53) 130/53 mmHg (09/25 0532) SpO2:  [95 %-100 %] 95 % (09/25 0532)  Intake/Output from previous day: 09/24 0701 - 09/25 0700 In: 170 [P.O.:120; IV Piggyback:50] Out: 2 [Urine:1; Stool:1] Intake/Output this shift:     Basename 03/23/12 0500 03/22/12 0750  HGB 9.3* 10.0*    Basename 03/23/12 0500 03/22/12 0750  WBC 5.0 6.1  RBC 3.46* 3.69*  HCT 28.9* 30.7*  PLT 214 218    Basename 03/23/12 0500 03/22/12 0750  NA 138 136  K 3.6 4.1  CL 104 101  CO2 26 30  BUN 12 15  CREATININE 0.51 0.58  GLUCOSE 116* 114*  CALCIUM 8.7 8.7    Basename 03/23/12 0500 03/22/12 0750  LABPT -- --  INR 1.47 1.61*    Intact pulses distally Dorsiflexion/Plantar flexion intact Incision: scant drainage No cellulitis present  Assessment/Plan: 7 Days Post-Op Procedure(s) (LRB): ARTHROPLASTY BIPOLAR HIP (Right) Discharge to SNF when medically clear.  Adelee Hannula Y 03/24/2012, 7:06 AM

## 2012-03-26 LAB — CULTURE, BLOOD (ROUTINE X 2): Culture: NO GROWTH

## 2012-08-17 ENCOUNTER — Encounter: Payer: Self-pay | Admitting: Neurology

## 2012-08-17 DIAGNOSIS — G3184 Mild cognitive impairment, so stated: Secondary | ICD-10-CM | POA: Insufficient documentation

## 2012-08-17 DIAGNOSIS — I6350 Cerebral infarction due to unspecified occlusion or stenosis of unspecified cerebral artery: Secondary | ICD-10-CM | POA: Insufficient documentation

## 2012-09-24 ENCOUNTER — Ambulatory Visit (INDEPENDENT_AMBULATORY_CARE_PROVIDER_SITE_OTHER): Payer: Medicare Other | Admitting: Neurology

## 2012-09-24 VITALS — BP 140/60 | HR 86 | Temp 98.3°F

## 2012-09-24 DIAGNOSIS — R269 Unspecified abnormalities of gait and mobility: Secondary | ICD-10-CM

## 2012-09-24 NOTE — Patient Instructions (Addendum)
Continue aspirin for stroke prevention and strict control of lipids with LDL cholesterol goal below 100 mg % and Ht with BP goal below 130/90. Patient counselled about fall precautions and to use walker at all times.f/U in future only as needed

## 2012-09-26 NOTE — Progress Notes (Signed)
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 She returns for followup after last visit on 04/22/12. She is accompanied by her daughter. She fell again last week and has a big bruise on her left thigh and right ankle. She had x-rays done at her orthopedic surgeon's office which did not reveal any fracture. She has been taking Pamelor and Aleve alternating which helps with the pain. She is currently living at home with her son. She is able to walk with a walker with one person assist but her walking is quite limited. She has not had any stroke or TIA symptoms. She is tolerating aspirin with only minor bruising. Her blood pressure is under good control and lipid profile has not recently been checked. ROS: 14 system review of system positive for fatigue, leg swelling, hearing loss, shortness of breath, wheezing, easy bruising and weakness and gait difficulty. Physical Exam General: well developed, well nourished, seated, in no evident distress Head: head normocephalic and atraumatic. Orohparynx benign Neck: supple with no carotid or supraclavicular bruits Cardiovascular: regular rate and rhythm, no murmurs Skin : edema feet 1 plus. Bruises right ankle and left thigh Musculoskeletal ; mild kyphosis  Neurologic Exam Mental Status: Awake and fully alert. Oriented to place and time. Recent and remote memory intact.diminished. Attention span, concentration and fund of knowledge are reduced. Mood and affect appropriate.  Cranial Nerves: Fundoscopic exam not done.upils equal, briskly reactive to light. Extraocular movements full without nystagmus. Visual fields full to confrontation. Hearing reduced biltatrlly. Facial sensation intact. Face, tongue, palate move normally and symmetrically. Neck flexion and extension normal.  Motor: Normal bulk  and tone. Normal strength in all tested extremity muscles. But LLE pain limits exam Sensory.: intact to tough and pinprick and vibratory.  Coordination: Rapid alternating movements normal in all extremities. Finger-to-nose and heel-to-shin performed accurately bilaterally. Gait and Station: Arises from chair with difficulty and 1 person assist. Stance and  Gait  Unstead. Reflexes: 1+ and symmetric. Toes downgoing.     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 Not a long-term anticoagulation candidate given advanced age, for rest and cognitive impairment.    PLAN:  Continue aspirin for stroke prevention and strict control of lipids with LDL cholesterol goal below 100 mg percent and hypertension with blood pressure goal below 130/90. I again counseled the patient about fall prevention and advise her to get up carefully and walk slowly and use the walker at all times. She may return for followup in the future only if necessary and no appointment was made.

## 2012-09-27 ENCOUNTER — Ambulatory Visit: Payer: Self-pay | Admitting: Neurology

## 2012-11-01 ENCOUNTER — Other Ambulatory Visit: Payer: Self-pay | Admitting: Orthopaedic Surgery

## 2012-11-01 DIAGNOSIS — M545 Low back pain: Secondary | ICD-10-CM

## 2012-11-05 ENCOUNTER — Ambulatory Visit
Admission: RE | Admit: 2012-11-05 | Discharge: 2012-11-05 | Disposition: A | Discharge status: Home / Self Care | Payer: Medicare Other | Source: Ambulatory Visit | Attending: Orthopaedic Surgery | Admitting: Orthopaedic Surgery

## 2012-11-05 DIAGNOSIS — M545 Low back pain: Secondary | ICD-10-CM

## 2012-11-18 ENCOUNTER — Other Ambulatory Visit (HOSPITAL_COMMUNITY): Payer: Self-pay | Admitting: Orthopaedic Surgery

## 2012-11-18 DIAGNOSIS — M549 Dorsalgia, unspecified: Secondary | ICD-10-CM

## 2012-11-18 DIAGNOSIS — IMO0002 Reserved for concepts with insufficient information to code with codable children: Secondary | ICD-10-CM

## 2012-11-19 ENCOUNTER — Ambulatory Visit (HOSPITAL_COMMUNITY): Admission: RE | Admit: 2012-11-19 | Payer: Medicare Other | Source: Ambulatory Visit

## 2012-11-23 ENCOUNTER — Ambulatory Visit (HOSPITAL_COMMUNITY)
Admission: RE | Admit: 2012-11-23 | Discharge: 2012-11-23 | Disposition: A | Discharge status: Home / Self Care | Payer: Medicare Other | Source: Ambulatory Visit | Attending: Orthopaedic Surgery | Admitting: Orthopaedic Surgery

## 2012-11-23 DIAGNOSIS — IMO0002 Reserved for concepts with insufficient information to code with codable children: Secondary | ICD-10-CM

## 2012-11-23 DIAGNOSIS — M549 Dorsalgia, unspecified: Secondary | ICD-10-CM

## 2012-11-24 ENCOUNTER — Other Ambulatory Visit: Payer: Self-pay | Admitting: Radiology

## 2012-11-24 ENCOUNTER — Other Ambulatory Visit (HOSPITAL_COMMUNITY): Payer: Self-pay | Admitting: Interventional Radiology

## 2012-11-24 DIAGNOSIS — IMO0002 Reserved for concepts with insufficient information to code with codable children: Secondary | ICD-10-CM

## 2012-11-25 ENCOUNTER — Other Ambulatory Visit (HOSPITAL_COMMUNITY): Payer: Self-pay | Admitting: Interventional Radiology

## 2012-11-25 ENCOUNTER — Ambulatory Visit (HOSPITAL_COMMUNITY)
Admission: RE | Admit: 2012-11-25 | Discharge: 2012-11-25 | Disposition: A | Discharge status: Home / Self Care | Payer: Medicare Other | Source: Ambulatory Visit | Attending: Interventional Radiology | Admitting: Interventional Radiology

## 2012-11-25 ENCOUNTER — Encounter (HOSPITAL_COMMUNITY): Payer: Self-pay

## 2012-11-25 DIAGNOSIS — IMO0002 Reserved for concepts with insufficient information to code with codable children: Secondary | ICD-10-CM

## 2012-11-25 DIAGNOSIS — H9190 Unspecified hearing loss, unspecified ear: Secondary | ICD-10-CM | POA: Insufficient documentation

## 2012-11-25 DIAGNOSIS — Z885 Allergy status to narcotic agent status: Secondary | ICD-10-CM | POA: Insufficient documentation

## 2012-11-25 DIAGNOSIS — S32009A Unspecified fracture of unspecified lumbar vertebra, initial encounter for closed fracture: Secondary | ICD-10-CM | POA: Insufficient documentation

## 2012-11-25 DIAGNOSIS — W19XXXA Unspecified fall, initial encounter: Secondary | ICD-10-CM | POA: Insufficient documentation

## 2012-11-25 DIAGNOSIS — I10 Essential (primary) hypertension: Secondary | ICD-10-CM | POA: Insufficient documentation

## 2012-11-25 DIAGNOSIS — S3210XA Unspecified fracture of sacrum, initial encounter for closed fracture: Secondary | ICD-10-CM | POA: Insufficient documentation

## 2012-11-25 LAB — CBC WITH DIFFERENTIAL/PLATELET
Basophils Relative: 0 % (ref 0–1)
HCT: 40 % (ref 36.0–46.0)
Hemoglobin: 14 g/dL (ref 12.0–15.0)
MCH: 29.7 pg (ref 26.0–34.0)
MCHC: 35 g/dL (ref 30.0–36.0)
Monocytes Absolute: 0.6 10*3/uL (ref 0.1–1.0)
Monocytes Relative: 11 % (ref 3–12)
Neutro Abs: 3.6 10*3/uL (ref 1.7–7.7)

## 2012-11-25 LAB — BASIC METABOLIC PANEL
BUN: 10 mg/dL (ref 6–23)
CO2: 29 mEq/L (ref 19–32)
Chloride: 95 mEq/L — ABNORMAL LOW (ref 96–112)
Glucose, Bld: 113 mg/dL — ABNORMAL HIGH (ref 70–99)
Potassium: 4.4 mEq/L (ref 3.5–5.1)

## 2012-11-25 MED ORDER — MIDAZOLAM HCL 2 MG/2ML IJ SOLN
INTRAMUSCULAR | Status: AC
  Filled 2012-11-25: qty 4

## 2012-11-25 MED ORDER — IOHEXOL 300 MG/ML  SOLN
50.0000 mL | Freq: Once | INTRAMUSCULAR | Status: AC | PRN
  Administered 2012-11-25: 5 mL

## 2012-11-25 MED ORDER — CEFAZOLIN SODIUM-DEXTROSE 2-3 GM-% IV SOLR
INTRAVENOUS | Status: AC
  Filled 2012-11-25: qty 50

## 2012-11-25 MED ORDER — FENTANYL CITRATE 0.05 MG/ML IJ SOLN
INTRAMUSCULAR | Status: AC
  Filled 2012-11-25: qty 4

## 2012-11-25 MED ORDER — SODIUM CHLORIDE 0.9 % IV SOLN: INTRAVENOUS | Status: AC

## 2012-11-25 MED ORDER — CEFAZOLIN SODIUM-DEXTROSE 2-3 GM-% IV SOLR
2.0000 g | Freq: Once | INTRAVENOUS | Status: AC
  Administered 2012-11-25: 2 g via INTRAVENOUS

## 2012-11-25 MED ORDER — HYDRALAZINE HCL 20 MG/ML IJ SOLN
INTRAMUSCULAR | Status: AC | PRN
  Administered 2012-11-25: 5 mg via INTRAVENOUS

## 2012-11-25 MED ORDER — HYDRALAZINE HCL 20 MG/ML IJ SOLN
INTRAMUSCULAR | Status: AC
  Filled 2012-11-25: qty 1

## 2012-11-25 MED ORDER — FENTANYL CITRATE 0.05 MG/ML IJ SOLN
INTRAMUSCULAR | Status: AC | PRN
  Administered 2012-11-25: 12.5 ug via INTRAVENOUS

## 2012-11-25 MED ORDER — SODIUM CHLORIDE 0.9 % IV SOLN
INTRAVENOUS | Status: DC
  Administered 2012-11-25: 11:00:00 via INTRAVENOUS

## 2012-11-25 MED ORDER — TOBRAMYCIN SULFATE 1.2 G IJ SOLR
INTRAMUSCULAR | Status: AC
  Filled 2012-11-25: qty 1.2

## 2012-11-25 NOTE — H&P (Signed)
Holly Allen is an 79 y.o. female.   Chief Complaint: suffered fall at home MRI reveals L1 fx and B sacral fxs Scheduled now for Lumbar 1 vertebroplasty and  B sacroplasty  HPI: HTN; HOH  Past Medical History  Diagnosis Date  . Hypertension     Past Surgical History  Procedure Laterality Date  . Hip arthroplasty  03/17/2012    Procedure: ARTHROPLASTY BIPOLAR HIP;  Surgeon: Kathryne Hitch, MD;  Location: [facility] OR;  Service: Orthopedics;  Laterality: Right;  . Ankle fracture surgery      History reviewed. No pertinent family history. Social History:  reports that she has never smoked. She has never used smokeless tobacco. She reports that she does not drink alcohol or use illicit drugs.  Allergies:  Allergies  Allergen Reactions  . Codeine     vomits     (Not in a hospital admission)  Results for orders placed during the hospital encounter of 11/25/12 (from the past 48 hour(s))  APTT     Status: None   Collection Time    11/25/12 11:07 AM      Result Value Range   aPTT 36  24 - 37 seconds  BASIC METABOLIC PANEL     Status: Abnormal   Collection Time    11/25/12 11:07 AM      Result Value Range   Sodium 131 (*) 135 - 145 mEq/L   Potassium 4.4  3.5 - 5.1 mEq/L   Chloride 95 (*) 96 - 112 mEq/L   CO2 29  19 - 32 mEq/L   Glucose, Bld 113 (*) 70 - 99 mg/dL   BUN 10  6 - 23 mg/dL   Creatinine, Ser 0.96  0.50 - 1.10 mg/dL   Calcium 9.6  8.4 - 04.5 mg/dL   GFR calc non Af Amer 72 (*) >90 mL/min   GFR calc Af Amer 84 (*) >90 mL/min   Comment:            The eGFR has been calculated     using the CKD EPI equation.     This calculation has not been     validated in all clinical     situations.     eGFR's persistently     <90 mL/min signify     possible Chronic Kidney Disease.  CBC WITH DIFFERENTIAL     Status: None   Collection Time    11/25/12 11:07 AM      Result Value Range   WBC 5.6  4.0 - 10.5 K/uL   RBC 4.71  3.87 - 5.11 MIL/uL   Hemoglobin 14.0   12.0 - 15.0 g/dL   HCT 40.9  81.1 - 91.4 %   MCV 84.9  78.0 - 100.0 fL   MCH 29.7  26.0 - 34.0 pg   MCHC 35.0  30.0 - 36.0 g/dL   RDW 78.2  95.6 - 21.3 %   Platelets 179  150 - 400 K/uL   Neutrophils Relative % 64  43 - 77 %   Neutro Abs 3.6  1.7 - 7.7 K/uL   Lymphocytes Relative 21  12 - 46 %   Lymphs Abs 1.2  0.7 - 4.0 K/uL   Monocytes Relative 11  3 - 12 %   Monocytes Absolute 0.6  0.1 - 1.0 K/uL   Eosinophils Relative 4  0 - 5 %   Eosinophils Absolute 0.2  0.0 - 0.7 K/uL   Basophils Relative 0  0 - 1 %  Basophils Absolute 0.0  0.0 - 0.1 K/uL  PROTIME-INR     Status: None   Collection Time    11/25/12 11:07 AM      Result Value Range   Prothrombin Time 14.3  11.6 - 15.2 seconds   INR 1.13  0.00 - 1.49   No results found.  Review of Systems  Constitutional: Positive for weight loss. Negative for fever.  HENT: Positive for hearing loss.   Respiratory: Negative for shortness of breath.   Cardiovascular: Negative for chest pain.  Gastrointestinal: Negative for nausea, vomiting and abdominal pain.  Musculoskeletal: Positive for back pain.  Neurological: Positive for weakness. Negative for dizziness and headaches.    Blood pressure 169/89, pulse 74, temperature 97.4 F (36.3 C), temperature source Oral, resp. rate 16, height 5\' 4"  (1.626 m), weight 100 lb (45.36 kg), SpO2 95.00%. Physical Exam  Constitutional: She is oriented to person, place, and time.  Cardiovascular: Normal rate and regular rhythm.   Murmur heard. Respiratory: Effort normal and breath sounds normal. She has no wheezes.  GI: Soft. Bowel sounds are normal. There is no tenderness.  Musculoskeletal: Normal range of motion.  Uses walker  Neurological: She is alert and oriented to person, place, and time.  Skin: Skin is warm.  Psychiatric: She has a normal mood and affect. Her behavior is normal. Judgment and thought content normal.     Assessment/Plan Fell at home L1 fx and B sacral fxs Scheduled for  VP and SP Pt and family aware of procedure benefits and risks and agreeable to proceed Consent signed and in chart  Charisa Twitty A 11/25/2012, 12:23 PM

## 2012-11-25 NOTE — ED Notes (Signed)
Pt complains of "terrible" back pain but is unable to rate.  When asked if this is the worse pain she has ever had she said " I don't know"

## 2012-11-25 NOTE — ED Notes (Signed)
Patient without S/S of  pain and is resting comfortably with eyes closed

## 2012-11-25 NOTE — Procedures (Signed)
S/P L1 VP and sacroplasty bilateral , for sacral insufficiency  fracture

## 2012-11-29 ENCOUNTER — Other Ambulatory Visit (HOSPITAL_COMMUNITY): Payer: Self-pay | Admitting: Interventional Radiology

## 2012-11-29 DIAGNOSIS — IMO0002 Reserved for concepts with insufficient information to code with codable children: Secondary | ICD-10-CM

## 2012-11-29 DIAGNOSIS — M549 Dorsalgia, unspecified: Secondary | ICD-10-CM

## 2012-12-09 ENCOUNTER — Ambulatory Visit (HOSPITAL_COMMUNITY)
Admission: RE | Admit: 2012-12-09 | Discharge: 2012-12-09 | Disposition: A | Discharge status: Home / Self Care | Payer: Medicare Other | Source: Ambulatory Visit | Attending: Interventional Radiology | Admitting: Interventional Radiology

## 2012-12-09 ENCOUNTER — Other Ambulatory Visit (HOSPITAL_COMMUNITY): Payer: Self-pay | Admitting: Interventional Radiology

## 2012-12-09 DIAGNOSIS — M549 Dorsalgia, unspecified: Secondary | ICD-10-CM

## 2012-12-09 DIAGNOSIS — X58XXXA Exposure to other specified factors, initial encounter: Secondary | ICD-10-CM | POA: Insufficient documentation

## 2012-12-09 DIAGNOSIS — IMO0002 Reserved for concepts with insufficient information to code with codable children: Secondary | ICD-10-CM

## 2012-12-09 DIAGNOSIS — M47817 Spondylosis without myelopathy or radiculopathy, lumbosacral region: Secondary | ICD-10-CM | POA: Insufficient documentation

## 2012-12-09 DIAGNOSIS — M5126 Other intervertebral disc displacement, lumbar region: Secondary | ICD-10-CM | POA: Insufficient documentation

## 2012-12-09 DIAGNOSIS — S22009A Unspecified fracture of unspecified thoracic vertebra, initial encounter for closed fracture: Secondary | ICD-10-CM | POA: Insufficient documentation

## 2012-12-10 ENCOUNTER — Other Ambulatory Visit: Payer: Self-pay | Admitting: Radiology

## 2012-12-10 ENCOUNTER — Other Ambulatory Visit (HOSPITAL_COMMUNITY): Payer: Self-pay | Admitting: Interventional Radiology

## 2012-12-10 ENCOUNTER — Encounter (HOSPITAL_COMMUNITY): Payer: Self-pay | Admitting: Pharmacy Technician

## 2012-12-10 DIAGNOSIS — M549 Dorsalgia, unspecified: Secondary | ICD-10-CM

## 2012-12-10 DIAGNOSIS — IMO0002 Reserved for concepts with insufficient information to code with codable children: Secondary | ICD-10-CM

## 2012-12-13 ENCOUNTER — Encounter (HOSPITAL_COMMUNITY): Payer: Self-pay

## 2012-12-13 ENCOUNTER — Ambulatory Visit (HOSPITAL_COMMUNITY)
Admission: RE | Admit: 2012-12-13 | Discharge: 2012-12-13 | Disposition: A | Discharge status: Home / Self Care | Payer: Medicare Other | Source: Ambulatory Visit | Attending: Interventional Radiology | Admitting: Interventional Radiology

## 2012-12-13 ENCOUNTER — Other Ambulatory Visit (HOSPITAL_COMMUNITY): Payer: Self-pay | Admitting: Interventional Radiology

## 2012-12-13 DIAGNOSIS — M549 Dorsalgia, unspecified: Secondary | ICD-10-CM

## 2012-12-13 DIAGNOSIS — M8448XA Pathological fracture, other site, initial encounter for fracture: Secondary | ICD-10-CM | POA: Insufficient documentation

## 2012-12-13 DIAGNOSIS — IMO0002 Reserved for concepts with insufficient information to code with codable children: Secondary | ICD-10-CM

## 2012-12-13 DIAGNOSIS — Z885 Allergy status to narcotic agent status: Secondary | ICD-10-CM | POA: Insufficient documentation

## 2012-12-13 DIAGNOSIS — I10 Essential (primary) hypertension: Secondary | ICD-10-CM | POA: Insufficient documentation

## 2012-12-13 DIAGNOSIS — R634 Abnormal weight loss: Secondary | ICD-10-CM | POA: Insufficient documentation

## 2012-12-13 LAB — CBC
HCT: 40.6 % (ref 36.0–46.0)
RBC: 4.78 MIL/uL (ref 3.87–5.11)
RDW: 13.2 % (ref 11.5–15.5)
WBC: 6 10*3/uL (ref 4.0–10.5)

## 2012-12-13 LAB — BASIC METABOLIC PANEL
CO2: 31 mEq/L (ref 19–32)
Chloride: 97 mEq/L (ref 96–112)
Creatinine, Ser: 0.75 mg/dL (ref 0.50–1.10)
GFR calc Af Amer: 81 mL/min — ABNORMAL LOW (ref >90)
Potassium: 4 mEq/L (ref 3.5–5.1)
Sodium: 134 mEq/L — ABNORMAL LOW (ref 135–145)

## 2012-12-13 LAB — PROTIME-INR: INR: 1.05 (ref 0.00–1.49)

## 2012-12-13 LAB — APTT: aPTT: 32 seconds (ref 24–37)

## 2012-12-13 MED ORDER — ACETAMINOPHEN 325 MG PO TABS
325.0000 mg | ORAL_TABLET | Freq: Once | ORAL | Status: AC
  Administered 2012-12-13: 325 mg via ORAL

## 2012-12-13 MED ORDER — FENTANYL CITRATE 0.05 MG/ML IJ SOLN
INTRAMUSCULAR | Status: AC | PRN
  Administered 2012-12-13 (×2): 25 ug via INTRAVENOUS

## 2012-12-13 MED ORDER — TOBRAMYCIN SULFATE 1.2 G IJ SOLR
INTRAMUSCULAR | Status: AC
  Filled 2012-12-13: qty 1.2

## 2012-12-13 MED ORDER — SODIUM CHLORIDE 0.9 % IV SOLN: Freq: Once | INTRAVENOUS | Status: DC

## 2012-12-13 MED ORDER — ACETAMINOPHEN 325 MG PO TABS
ORAL_TABLET | ORAL | Status: AC
  Filled 2012-12-13: qty 1

## 2012-12-13 MED ORDER — CEFAZOLIN SODIUM-DEXTROSE 2-3 GM-% IV SOLR
INTRAVENOUS | Status: AC
  Administered 2012-12-13: 2 g via INTRAVENOUS
  Filled 2012-12-13: qty 50

## 2012-12-13 MED ORDER — FENTANYL CITRATE 0.05 MG/ML IJ SOLN
INTRAMUSCULAR | Status: AC
  Filled 2012-12-13: qty 6

## 2012-12-13 MED ORDER — HYDRALAZINE HCL 20 MG/ML IJ SOLN
INTRAMUSCULAR | Status: AC | PRN
  Administered 2012-12-13: 5 mg via INTRAVENOUS

## 2012-12-13 MED ORDER — GELATIN ABSORBABLE 12-7 MM EX MISC
CUTANEOUS | Status: AC
  Filled 2012-12-13: qty 1

## 2012-12-13 MED ORDER — HYDRALAZINE HCL 20 MG/ML IJ SOLN
INTRAMUSCULAR | Status: AC
  Filled 2012-12-13: qty 1

## 2012-12-13 MED ORDER — MIDAZOLAM HCL 2 MG/2ML IJ SOLN
INTRAMUSCULAR | Status: AC
  Filled 2012-12-13: qty 6

## 2012-12-13 MED ORDER — MIDAZOLAM HCL 2 MG/2ML IJ SOLN
INTRAMUSCULAR | Status: AC | PRN
  Administered 2012-12-13: 0.5 mg via INTRAVENOUS
  Administered 2012-12-13 (×2): 1 mg via INTRAVENOUS

## 2012-12-13 MED ORDER — CEFAZOLIN SODIUM-DEXTROSE 2-3 GM-% IV SOLR: 2.0000 g | Freq: Once | INTRAVENOUS | Status: AC

## 2012-12-13 MED ORDER — SODIUM CHLORIDE 0.9 % IV SOLN: INTRAVENOUS | Status: AC

## 2012-12-13 MED ORDER — IOHEXOL 300 MG/ML  SOLN
50.0000 mL | Freq: Once | INTRAMUSCULAR | Status: AC | PRN
  Administered 2012-12-13: 5 mL via INTRAVENOUS

## 2012-12-13 MED ORDER — HYDROMORPHONE HCL PF 1 MG/ML IJ SOLN
INTRAMUSCULAR | Status: AC
  Filled 2012-12-13: qty 3

## 2012-12-13 NOTE — H&P (Signed)
Holly Allen is an 79 y.o. female.   Chief Complaint: new back pain x 1-2 weeks MRI reveals new Thoracic 12 and Lumbar 4 fractures Scheduled for T12 and L4 vertebroplasty/kyphoplasty Hx Lumbar 1 Vertebroplasty and Sacroplasty 11/26/12  HPI: HTN; HOH  Past Medical History  Diagnosis Date  . Hypertension     Past Surgical History  Procedure Laterality Date  . Hip arthroplasty  03/17/2012    Procedure: ARTHROPLASTY BIPOLAR HIP;  Surgeon: Kathryne Hitch, MD;  Location: [facility] OR;  Service: Orthopedics;  Laterality: Right;  . Ankle fracture surgery      History reviewed. No pertinent family history. Social History:  reports that she has never smoked. She has never used smokeless tobacco. She reports that she does not drink alcohol or use illicit drugs.  Allergies:  Allergies  Allergen Reactions  . Codeine     vomits     (Not in a hospital admission)  Results for orders placed during the hospital encounter of 12/13/12 (from the past 48 hour(s))  CBC     Status: None   Collection Time    12/13/12  9:52 AM      Result Value Range   WBC 6.0  4.0 - 10.5 K/uL   RBC 4.78  3.87 - 5.11 MIL/uL   Hemoglobin 13.9  12.0 - 15.0 g/dL   HCT 16.1  09.6 - 04.5 %   MCV 84.9  78.0 - 100.0 fL   MCH 29.1  26.0 - 34.0 pg   MCHC 34.2  30.0 - 36.0 g/dL   RDW 40.9  81.1 - 91.4 %   Platelets 231  150 - 400 K/uL   No results found.  Review of Systems  Constitutional: Positive for weight loss. Negative for fever.  HENT: Positive for hearing loss.   Eyes: Negative for blurred vision.  Respiratory: Negative for shortness of breath.   Cardiovascular: Negative for chest pain.  Gastrointestinal: Negative for nausea, vomiting and abdominal pain.  Musculoskeletal: Positive for back pain.  Neurological: Positive for weakness. Negative for dizziness.    Blood pressure 169/83, pulse 73, temperature 97.1 F (36.2 C), temperature source Oral, resp. rate 16, height 5\' 4"  (1.626 m), weight 107 lb  (48.535 kg), SpO2 97.00%. Physical Exam  Constitutional: She is oriented to person, place, and time.  Frail, weak  Cardiovascular: Normal rate and regular rhythm.   Murmur heard. Respiratory: Effort normal and breath sounds normal. She has no wheezes.  GI: Soft. Bowel sounds are normal. There is no tenderness.  Musculoskeletal: Normal range of motion.  Uses walker Weak; back pain  Neurological: She is alert and oriented to person, place, and time.  Skin: Skin is warm and dry.  Psychiatric: She has a normal mood and affect. Her behavior is normal. Judgment and thought content normal.     Assessment/Plan New T12 and L4 fractures; very painful Scheduled for T12 and L4 VP/KP Pt and family aware of procedure benefits and risks and agreeable to proceed consent signed and in chart  Taariq Leitz A 12/13/2012, 10:12 AM

## 2012-12-13 NOTE — ED Notes (Signed)
Patient denies pain and is resting comfortably.  

## 2012-12-13 NOTE — Procedures (Addendum)
vertebroplasty at T 12 and L4 for painful compression fractures

## 2012-12-13 NOTE — ED Notes (Signed)
Family updated as to patient's status.

## 2012-12-16 ENCOUNTER — Other Ambulatory Visit (HOSPITAL_COMMUNITY): Payer: Self-pay | Admitting: Interventional Radiology

## 2012-12-16 DIAGNOSIS — IMO0002 Reserved for concepts with insufficient information to code with codable children: Secondary | ICD-10-CM

## 2012-12-27 ENCOUNTER — Other Ambulatory Visit (HOSPITAL_COMMUNITY): Payer: Self-pay | Admitting: Interventional Radiology

## 2012-12-27 ENCOUNTER — Ambulatory Visit (HOSPITAL_COMMUNITY)
Admission: RE | Admit: 2012-12-27 | Discharge: 2012-12-27 | Disposition: A | Discharge status: Home / Self Care | Payer: Medicare Other | Source: Ambulatory Visit | Attending: Interventional Radiology | Admitting: Interventional Radiology

## 2012-12-27 DIAGNOSIS — X58XXXA Exposure to other specified factors, initial encounter: Secondary | ICD-10-CM | POA: Insufficient documentation

## 2012-12-27 DIAGNOSIS — M549 Dorsalgia, unspecified: Secondary | ICD-10-CM

## 2012-12-27 DIAGNOSIS — S22009A Unspecified fracture of unspecified thoracic vertebra, initial encounter for closed fracture: Secondary | ICD-10-CM | POA: Insufficient documentation

## 2012-12-27 DIAGNOSIS — Z9889 Other specified postprocedural states: Secondary | ICD-10-CM | POA: Insufficient documentation

## 2012-12-27 DIAGNOSIS — IMO0002 Reserved for concepts with insufficient information to code with codable children: Secondary | ICD-10-CM

## 2012-12-29 ENCOUNTER — Other Ambulatory Visit (HOSPITAL_COMMUNITY): Payer: Self-pay | Admitting: Interventional Radiology

## 2012-12-29 ENCOUNTER — Other Ambulatory Visit: Payer: Self-pay | Admitting: Radiology

## 2012-12-29 ENCOUNTER — Encounter (HOSPITAL_COMMUNITY): Payer: Self-pay | Admitting: Pharmacy Technician

## 2012-12-29 DIAGNOSIS — IMO0002 Reserved for concepts with insufficient information to code with codable children: Secondary | ICD-10-CM

## 2012-12-29 DIAGNOSIS — M549 Dorsalgia, unspecified: Secondary | ICD-10-CM

## 2012-12-30 ENCOUNTER — Ambulatory Visit (HOSPITAL_COMMUNITY)
Admission: RE | Admit: 2012-12-30 | Discharge: 2012-12-30 | Disposition: A | Discharge status: Home / Self Care | Payer: Medicare Other | Source: Ambulatory Visit | Attending: Interventional Radiology | Admitting: Interventional Radiology

## 2012-12-30 DIAGNOSIS — Z87311 Personal history of (healed) other pathological fracture: Secondary | ICD-10-CM | POA: Insufficient documentation

## 2012-12-30 DIAGNOSIS — M810 Age-related osteoporosis without current pathological fracture: Secondary | ICD-10-CM | POA: Insufficient documentation

## 2012-12-30 DIAGNOSIS — Z7982 Long term (current) use of aspirin: Secondary | ICD-10-CM | POA: Insufficient documentation

## 2012-12-30 DIAGNOSIS — Z79899 Other long term (current) drug therapy: Secondary | ICD-10-CM | POA: Insufficient documentation

## 2012-12-30 DIAGNOSIS — Z885 Allergy status to narcotic agent status: Secondary | ICD-10-CM | POA: Insufficient documentation

## 2012-12-30 DIAGNOSIS — IMO0002 Reserved for concepts with insufficient information to code with codable children: Secondary | ICD-10-CM

## 2012-12-30 DIAGNOSIS — M549 Dorsalgia, unspecified: Secondary | ICD-10-CM

## 2012-12-30 DIAGNOSIS — I10 Essential (primary) hypertension: Secondary | ICD-10-CM | POA: Insufficient documentation

## 2012-12-30 DIAGNOSIS — M8448XA Pathological fracture, other site, initial encounter for fracture: Secondary | ICD-10-CM | POA: Insufficient documentation

## 2012-12-30 LAB — BASIC METABOLIC PANEL
CO2: 30 mEq/L (ref 19–32)
Calcium: 8.9 mg/dL (ref 8.4–10.5)
Creatinine, Ser: 0.7 mg/dL (ref 0.50–1.10)
Glucose, Bld: 120 mg/dL — ABNORMAL HIGH (ref 70–99)

## 2012-12-30 LAB — CBC
MCH: 28.7 pg (ref 26.0–34.0)
MCV: 84.3 fL (ref 78.0–100.0)
Platelets: 208 10*3/uL (ref 150–400)
RDW: 13.3 % (ref 11.5–15.5)

## 2012-12-30 MED ORDER — CEFAZOLIN SODIUM-DEXTROSE 2-3 GM-% IV SOLR
2.0000 g | Freq: Once | INTRAVENOUS | Status: AC
  Administered 2012-12-30: 2 g via INTRAVENOUS
  Filled 2012-12-30: qty 50

## 2012-12-30 MED ORDER — SODIUM CHLORIDE 0.9 % IV SOLN
Freq: Once | INTRAVENOUS | Status: AC
  Administered 2012-12-30: 12:00:00 via INTRAVENOUS

## 2012-12-30 MED ORDER — FENTANYL CITRATE 0.05 MG/ML IJ SOLN
INTRAMUSCULAR | Status: AC
  Filled 2012-12-30: qty 4

## 2012-12-30 MED ORDER — HYDROMORPHONE HCL PF 1 MG/ML IJ SOLN
INTRAMUSCULAR | Status: AC
  Filled 2012-12-30: qty 3

## 2012-12-30 MED ORDER — SODIUM CHLORIDE 0.9 % IV SOLN: INTRAVENOUS | Status: AC

## 2012-12-30 MED ORDER — TOBRAMYCIN SULFATE 1.2 G IJ SOLR
INTRAMUSCULAR | Status: AC
  Filled 2012-12-30: qty 1.2

## 2012-12-30 MED ORDER — FENTANYL CITRATE 0.05 MG/ML IJ SOLN
INTRAMUSCULAR | Status: AC | PRN
  Administered 2012-12-30: 25 ug via INTRAVENOUS
  Administered 2012-12-30: 12.5 ug via INTRAVENOUS

## 2012-12-30 MED ORDER — HYDRALAZINE HCL 20 MG/ML IJ SOLN
INTRAMUSCULAR | Status: AC
  Filled 2012-12-30: qty 1

## 2012-12-30 MED ORDER — MIDAZOLAM HCL 2 MG/2ML IJ SOLN
INTRAMUSCULAR | Status: AC | PRN
  Administered 2012-12-30: 1 mg via INTRAVENOUS
  Administered 2012-12-30: 0.5 mg via INTRAVENOUS

## 2012-12-30 MED ORDER — MIDAZOLAM HCL 2 MG/2ML IJ SOLN
INTRAMUSCULAR | Status: DC
Start: 2012-12-30 — End: 2012-12-31
  Filled 2012-12-30: qty 6

## 2012-12-30 MED ORDER — NALOXONE HCL 0.4 MG/ML IJ SOLN
INTRAMUSCULAR | Status: AC
  Filled 2012-12-30: qty 1

## 2012-12-30 NOTE — H&P (Signed)
Holly Allen is an 79 y.o. female.   Chief Complaint: back pain HPI: Patient with history of osteoporosis, prior vertebroplasties at L1, T12, L4 levels and sacroplasty at S1  , persistent back pain and evidence of new T11 compression on recent imaging presents today for T11 vertebroplasty/kyphoplasty.  Past Medical History  Diagnosis Date  . Hypertension    CVA Past Surgical History  Procedure Laterality Date  . Hip arthroplasty  03/17/2012    Procedure: ARTHROPLASTY BIPOLAR HIP;  Surgeon: Kathryne Hitch, MD;  Location: [facility] OR;  Service: Orthopedics;  Laterality: Right;  . Ankle fracture surgery      No family history on file. Social History:  reports that she has never smoked. She has never used smokeless tobacco. She reports that she does not drink alcohol or use illicit drugs.  Allergies:  Allergies  Allergen Reactions  . Codeine     vomits    Current outpatient prescriptions:aspirin EC 81 MG tablet, Take 162 mg by mouth 2 (two) times daily., Disp: , Rfl: ;  Cholecalciferol (VITAMIN D PO), Take 1 tablet by mouth every evening., Disp: , Rfl: ;  metoprolol (LOPRESSOR) 50 MG tablet, Take 50 mg by mouth 2 (two) times daily., Disp: , Rfl: ;  pravastatin (PRAVACHOL) 20 MG tablet, Take 20 mg by mouth daily., Disp: , Rfl:  traMADol (ULTRAM) 50 MG tablet, Take 25 mg by mouth every 6 (six) hours as needed for pain., Disp: , Rfl:  Current facility-administered medications:ceFAZolin (ANCEF) IVPB 2 g/50 mL premix, 2 g, Intravenous, Once, Robet Leu, PA-C  12/30/12 labs pending Review of Systems  Constitutional: Negative for fever and chills.  HENT: Positive for hearing loss.   Respiratory: Negative for cough and shortness of breath.   Cardiovascular: Negative for chest pain.  Gastrointestinal: Positive for abdominal pain. Negative for nausea and vomiting.  Musculoskeletal: Positive for back pain.  Neurological: Negative for headaches.    Blood pressure 180/61, pulse 71,  temperature 97.2 F (36.2 C), temperature source Oral, height 5\' 4"  (1.626 m), weight 105 lb (47.628 kg), SpO2 100.00%. Physical Exam  Constitutional:  Thin, elderly WF c/o back pain; HOH  Cardiovascular: Normal rate and regular rhythm.   Respiratory: Effort normal and breath sounds normal.  GI: Soft. Bowel sounds are normal. She exhibits no distension. There is tenderness.  Musculoskeletal: Normal range of motion. She exhibits no edema.  Neurological: She is alert.  Mild cognitive impairment     Assessment/Plan Pt with hx osteoporosis, multiple vertebroplasties at L1,L4,T12 levels and sacroplasty at S1, continued back pain and new compression fracture at T11. Plan is for T11 vertebroplasty/kyphoplasty today. Details/risks of procedure d/w pt/family with their understanding and consent.  ALLRED,D KEVIN 12/30/2012, 11:45 AM

## 2012-12-30 NOTE — Procedures (Signed)
SA/P T 11 KP with biopsy.

## 2013-01-05 ENCOUNTER — Telehealth (HOSPITAL_COMMUNITY): Payer: Self-pay | Admitting: *Deleted

## 2013-01-05 NOTE — Telephone Encounter (Signed)
Post procedure follow up call attempt.  No answer and no available VM 

## 2013-01-18 ENCOUNTER — Emergency Department (HOSPITAL_BASED_OUTPATIENT_CLINIC_OR_DEPARTMENT_OTHER)
Admission: EM | Admit: 2013-01-18 | Discharge: 2013-01-18 | Disposition: A | Discharge status: Home Health | Payer: Medicare Other | Attending: Emergency Medicine | Admitting: Emergency Medicine

## 2013-01-18 ENCOUNTER — Encounter (HOSPITAL_BASED_OUTPATIENT_CLINIC_OR_DEPARTMENT_OTHER): Payer: Self-pay | Admitting: *Deleted

## 2013-01-18 ENCOUNTER — Emergency Department (HOSPITAL_BASED_OUTPATIENT_CLINIC_OR_DEPARTMENT_OTHER): Payer: Medicare Other

## 2013-01-18 DIAGNOSIS — Z7982 Long term (current) use of aspirin: Secondary | ICD-10-CM | POA: Insufficient documentation

## 2013-01-18 DIAGNOSIS — N63 Unspecified lump in unspecified breast: Secondary | ICD-10-CM

## 2013-01-18 DIAGNOSIS — I10 Essential (primary) hypertension: Secondary | ICD-10-CM | POA: Insufficient documentation

## 2013-01-18 DIAGNOSIS — Z79899 Other long term (current) drug therapy: Secondary | ICD-10-CM | POA: Insufficient documentation

## 2013-01-18 DIAGNOSIS — R1084 Generalized abdominal pain: Secondary | ICD-10-CM | POA: Insufficient documentation

## 2013-01-18 DIAGNOSIS — R109 Unspecified abdominal pain: Secondary | ICD-10-CM

## 2013-01-18 DIAGNOSIS — K5900 Constipation, unspecified: Secondary | ICD-10-CM | POA: Insufficient documentation

## 2013-01-18 LAB — CBC WITH DIFFERENTIAL/PLATELET
Basophils Relative: 0 % (ref 0–1)
Eosinophils Absolute: 0.2 10*3/uL (ref 0.0–0.7)
Eosinophils Relative: 3 % (ref 0–5)
Hemoglobin: 13.5 g/dL (ref 12.0–15.0)
Lymphs Abs: 1.1 10*3/uL (ref 0.7–4.0)
MCH: 28.4 pg (ref 26.0–34.0)
MCHC: 33.2 g/dL (ref 30.0–36.0)
MCV: 85.7 fL (ref 78.0–100.0)
Monocytes Absolute: 0.7 10*3/uL (ref 0.1–1.0)
Monocytes Relative: 11 % (ref 3–12)
Neutrophils Relative %: 67 % (ref 43–77)
RBC: 4.75 MIL/uL (ref 3.87–5.11)

## 2013-01-18 LAB — COMPREHENSIVE METABOLIC PANEL
Albumin: 3.3 g/dL — ABNORMAL LOW (ref 3.5–5.2)
Alkaline Phosphatase: 164 U/L — ABNORMAL HIGH (ref 39–117)
BUN: 14 mg/dL (ref 6–23)
Calcium: 9.9 mg/dL (ref 8.4–10.5)
Creatinine, Ser: 0.8 mg/dL (ref 0.50–1.10)
GFR calc Af Amer: 70 mL/min — ABNORMAL LOW (ref >90)
Glucose, Bld: 98 mg/dL (ref 70–99)
Potassium: 4.3 mEq/L (ref 3.5–5.1)
Total Protein: 6.3 g/dL (ref 6.0–8.3)

## 2013-01-18 LAB — LIPASE, BLOOD: Lipase: 11 U/L (ref 11–59)

## 2013-01-18 MED ORDER — TRAMADOL HCL 50 MG PO TABS
25.0000 mg | ORAL_TABLET | Freq: Once | ORAL | Status: AC
  Administered 2013-01-18: 25 mg via ORAL
  Filled 2013-01-18: qty 1

## 2013-01-18 MED ORDER — FLEET ENEMA 7-19 GM/118ML RE ENEM
1.0000 | ENEMA | Freq: Once | RECTAL | Status: AC
  Administered 2013-01-18: 1 via RECTAL
  Filled 2013-01-18 (×2): qty 1

## 2013-01-18 NOTE — ED Notes (Signed)
Multiple complaints. Abdominal pain and back pain for a month. Constipation. Bowel movement this am after taking laxatives. She also has a large lump in her right breast that she has had for 3 months. Her MD knows about her history.

## 2013-01-18 NOTE — ED Provider Notes (Signed)
History    CSN: [id number] Arrival date & time 01/18/13  1212  First MD Initiated Contact with Patient 01/18/13 1226     Chief Complaint  Patient presents with  . Abdominal Pain   (Consider location/radiation/quality/duration/timing/severity/associated sxs/prior Treatment) HPI Comments: Patient presents with complaints of generalized abdominal pain and cramping for the past month, worse over the past week during which time she has not had a bowel movement.  No fevers or chills.  No urinary complaints.    She also has a lump in the right breast that she has had for months that she wants checked.  It is sore to the touch.    Patient is a 79 y.o. female presenting with abdominal pain. The history is provided by the patient.  Abdominal Pain This is a new problem. Episode onset: one month ago. The problem occurs constantly. The problem has been gradually worsening. Associated symptoms include abdominal pain. Nothing aggravates the symptoms. Nothing relieves the symptoms. Treatments tried: milk of magnesia.   Past Medical History  Diagnosis Date  . Hypertension    Past Surgical History  Procedure Laterality Date  . Hip arthroplasty  03/17/2012    Procedure: ARTHROPLASTY BIPOLAR HIP;  Surgeon: Kathryne Hitch, MD;  Location: [facility];  Service: Orthopedics;  Laterality: Right;  . Ankle fracture surgery     No family history on file. History  Substance Use Topics  . Smoking status: Never Smoker   . Smokeless tobacco: Never Used  . Alcohol Use: No   OB History   Grav Para Term Preterm Abortions TAB SAB Ect Mult Living                 Review of Systems  Gastrointestinal: Positive for abdominal pain.  All other systems reviewed and are negative.    Allergies  Codeine  Home Medications   Current Outpatient Rx  Name  Route  Sig  Dispense  Refill  . meloxicam (MOBIC) 15 MG tablet   Oral   Take 15 mg by mouth daily.         Marland Kitchen aspirin EC 81 MG tablet   Oral   Take  162 mg by mouth 2 (two) times daily.         . Cholecalciferol (VITAMIN D PO)   Oral   Take 1 tablet by mouth every evening.         . metoprolol (LOPRESSOR) 50 MG tablet   Oral   Take 50 mg by mouth 2 (two) times daily.         . pravastatin (PRAVACHOL) 20 MG tablet   Oral   Take 20 mg by mouth daily.         . traMADol (ULTRAM) 50 MG tablet   Oral   Take 25 mg by mouth every 6 (six) hours as needed for pain.          BP 167/90  Pulse 85  Temp(Src) 98.5 F (36.9 C) (Oral)  Resp 18  Wt 105 lb (47.628 kg)  BMI 18.01 kg/m2  SpO2 97% Physical Exam  Nursing note and vitals reviewed. Constitutional: She is oriented to person, place, and time. She appears well-developed and well-nourished. No distress.  HENT:  Head: Normocephalic and atraumatic.  Mouth/Throat: Oropharynx is clear and moist.  Neck: Normal range of motion. Neck supple.  Cardiovascular: Normal rate, regular rhythm and intact distal pulses.   Pulmonary/Chest: Effort normal and breath sounds normal. No respiratory distress.  Abdominal: Soft. Bowel sounds  are normal. She exhibits no distension. There is no tenderness.  Genitourinary:  There is a fecal impaction present.  Musculoskeletal: Normal range of motion.  Neurological: She is alert and oriented to person, place, and time.  Skin: Skin is warm and dry. She is not diaphoretic.  There is a large egg-sized tender lump in the right breast.  There is no surrounding erythema or swelling.  It is somewhat tender to the touch.      ED Course  Procedures (including critical care time) Labs Reviewed  CBC WITH DIFFERENTIAL  COMPREHENSIVE METABOLIC PANEL  LIPASE, BLOOD   No results found. No diagnosis found.  MDM  The labs are unremarkable and the xrays show a fecal impaction.  She was given a fleets enema with good results and is feeling better.  There is no wbc elevation and no fevers, and the abdominal exam is reassuring.  Will discharge to home, to  return prn if she worsens.    Geoffery Lyons, MD 01/18/13 [phone]

## 2013-01-18 NOTE — ED Notes (Signed)
Report received from Sharon RN. Assumed care of pt at this time.  

## 2013-02-02 ENCOUNTER — Inpatient Hospital Stay (HOSPITAL_COMMUNITY): Payer: Medicare Other

## 2013-02-02 ENCOUNTER — Emergency Department (HOSPITAL_COMMUNITY): Payer: Medicare Other

## 2013-02-02 ENCOUNTER — Inpatient Hospital Stay (HOSPITAL_COMMUNITY)
Admission: EM | Admit: 2013-02-02 | Discharge: 2013-02-07 | DRG: 064 | Disposition: A | Discharge status: Skilled Nursing Facility | Payer: Medicare Other | Attending: Internal Medicine | Admitting: Internal Medicine

## 2013-02-02 ENCOUNTER — Encounter (HOSPITAL_COMMUNITY): Payer: Self-pay | Admitting: Emergency Medicine

## 2013-02-02 DIAGNOSIS — I10 Essential (primary) hypertension: Secondary | ICD-10-CM | POA: Diagnosis present

## 2013-02-02 DIAGNOSIS — J4489 Other specified chronic obstructive pulmonary disease: Secondary | ICD-10-CM | POA: Diagnosis present

## 2013-02-02 DIAGNOSIS — B961 Klebsiella pneumoniae [K. pneumoniae] as the cause of diseases classified elsewhere: Secondary | ICD-10-CM | POA: Diagnosis present

## 2013-02-02 DIAGNOSIS — Z7982 Long term (current) use of aspirin: Secondary | ICD-10-CM

## 2013-02-02 DIAGNOSIS — G8190 Hemiplegia, unspecified affecting unspecified side: Secondary | ICD-10-CM | POA: Diagnosis not present

## 2013-02-02 DIAGNOSIS — J449 Chronic obstructive pulmonary disease, unspecified: Secondary | ICD-10-CM

## 2013-02-02 DIAGNOSIS — R4789 Other speech disturbances: Secondary | ICD-10-CM | POA: Diagnosis present

## 2013-02-02 DIAGNOSIS — D649 Anemia, unspecified: Secondary | ICD-10-CM

## 2013-02-02 DIAGNOSIS — N390 Urinary tract infection, site not specified: Secondary | ICD-10-CM | POA: Diagnosis present

## 2013-02-02 DIAGNOSIS — R279 Unspecified lack of coordination: Secondary | ICD-10-CM | POA: Diagnosis present

## 2013-02-02 DIAGNOSIS — Z8673 Personal history of transient ischemic attack (TIA), and cerebral infarction without residual deficits: Secondary | ICD-10-CM

## 2013-02-02 DIAGNOSIS — J189 Pneumonia, unspecified organism: Secondary | ICD-10-CM

## 2013-02-02 DIAGNOSIS — I639 Cerebral infarction, unspecified: Secondary | ICD-10-CM

## 2013-02-02 DIAGNOSIS — R1310 Dysphagia, unspecified: Secondary | ICD-10-CM | POA: Diagnosis present

## 2013-02-02 DIAGNOSIS — H9190 Unspecified hearing loss, unspecified ear: Secondary | ICD-10-CM | POA: Diagnosis present

## 2013-02-02 DIAGNOSIS — D509 Iron deficiency anemia, unspecified: Secondary | ICD-10-CM

## 2013-02-02 DIAGNOSIS — I63511 Cerebral infarction due to unspecified occlusion or stenosis of right middle cerebral artery: Secondary | ICD-10-CM

## 2013-02-02 DIAGNOSIS — E871 Hypo-osmolality and hyponatremia: Secondary | ICD-10-CM | POA: Diagnosis present

## 2013-02-02 DIAGNOSIS — G3184 Mild cognitive impairment, so stated: Secondary | ICD-10-CM

## 2013-02-02 DIAGNOSIS — I6350 Cerebral infarction due to unspecified occlusion or stenosis of unspecified cerebral artery: Principal | ICD-10-CM | POA: Diagnosis present

## 2013-02-02 DIAGNOSIS — Z79899 Other long term (current) drug therapy: Secondary | ICD-10-CM

## 2013-02-02 DIAGNOSIS — D72829 Elevated white blood cell count, unspecified: Secondary | ICD-10-CM

## 2013-02-02 DIAGNOSIS — G9340 Encephalopathy, unspecified: Secondary | ICD-10-CM | POA: Diagnosis present

## 2013-02-02 HISTORY — DX: Transient cerebral ischemic attack, unspecified: G45.9

## 2013-02-02 LAB — URINALYSIS, ROUTINE W REFLEX MICROSCOPIC
Glucose, UA: 100 mg/dL — AB
Ketones, ur: 15 mg/dL — AB
Protein, ur: 100 mg/dL — AB
pH: 7 (ref 5.0–8.0)

## 2013-02-02 LAB — COMPREHENSIVE METABOLIC PANEL
AST: 14 U/L (ref 0–37)
Albumin: 3.4 g/dL — ABNORMAL LOW (ref 3.5–5.2)
Alkaline Phosphatase: 160 U/L — ABNORMAL HIGH (ref 39–117)
BUN: 14 mg/dL (ref 6–23)
Chloride: 88 mEq/L — ABNORMAL LOW (ref 96–112)
Potassium: 3.8 mEq/L (ref 3.5–5.1)
Sodium: 126 mEq/L — ABNORMAL LOW (ref 135–145)
Total Bilirubin: 1.1 mg/dL (ref 0.3–1.2)
Total Protein: 6.6 g/dL (ref 6.0–8.3)

## 2013-02-02 LAB — CBC
HCT: 41 % (ref 36.0–46.0)
HCT: 39.5 % (ref 36.0–46.0)
Hemoglobin: 14.6 g/dL (ref 12.0–15.0)
Hemoglobin: 14.1 g/dL (ref 12.0–15.0)
MCH: 29.1 pg (ref 26.0–34.0)
MCHC: 35.6 g/dL (ref 30.0–36.0)
MCHC: 35.7 g/dL (ref 30.0–36.0)
MCV: 81.4 fL (ref 78.0–100.0)
RBC: 4.98 MIL/uL (ref 3.87–5.11)
WBC: 9.6 10*3/uL (ref 4.0–10.5)

## 2013-02-02 LAB — CREATININE, SERUM: Creatinine, Ser: 0.75 mg/dL (ref 0.50–1.10)

## 2013-02-02 LAB — TSH: TSH: 1.691 u[IU]/mL (ref 0.350–4.500)

## 2013-02-02 LAB — URINE MICROSCOPIC-ADD ON

## 2013-02-02 LAB — TROPONIN I
Troponin I: <0.3 ng/mL (ref <0.30)
Troponin I: <0.3 ng/mL (ref <0.30)

## 2013-02-02 LAB — POCT I-STAT TROPONIN I: Troponin i, poc: 0.01 ng/mL (ref 0.00–0.08)

## 2013-02-02 LAB — LACTIC ACID, PLASMA: Lactic Acid, Venous: 2.5 mmol/L — ABNORMAL HIGH (ref 0.5–2.2)

## 2013-02-02 MED ORDER — TRAMADOL HCL 50 MG PO TABS
25.0000 mg | ORAL_TABLET | Freq: Three times a day (TID) | ORAL | Status: DC | PRN
  Administered 2013-02-06: 25 mg via ORAL
  Filled 2013-02-02: qty 1

## 2013-02-02 MED ORDER — LORAZEPAM 2 MG/ML IJ SOLN
0.2500 mg | Freq: Once | INTRAMUSCULAR | Status: AC
  Filled 2013-02-02: qty 1

## 2013-02-02 MED ORDER — SODIUM CHLORIDE 0.9 % IV SOLN
INTRAVENOUS | Status: DC
  Administered 2013-02-02: 17:00:00 via INTRAVENOUS
  Administered 2013-02-03: 75 mL/h via INTRAVENOUS
  Administered 2013-02-03: 19:00:00 via INTRAVENOUS

## 2013-02-02 MED ORDER — SIMVASTATIN 10 MG PO TABS
10.0000 mg | ORAL_TABLET | Freq: Every day | ORAL | Status: DC
  Administered 2013-02-04 – 2013-02-06 (×3): 10 mg via ORAL
  Filled 2013-02-02 (×5): qty 1

## 2013-02-02 MED ORDER — HEPARIN SODIUM (PORCINE) 5000 UNIT/ML IJ SOLN
5000.0000 [IU] | Freq: Three times a day (TID) | INTRAMUSCULAR | Status: DC
  Administered 2013-02-02 – 2013-02-07 (×13): 5000 [IU] via SUBCUTANEOUS
  Filled 2013-02-02 (×17): qty 1

## 2013-02-02 MED ORDER — LABETALOL HCL 5 MG/ML IV SOLN
10.0000 mg | INTRAVENOUS | Status: DC | PRN
  Administered 2013-02-03 – 2013-02-05 (×3): 10 mg via INTRAVENOUS
  Filled 2013-02-02 (×3): qty 4

## 2013-02-02 MED ORDER — ASPIRIN 300 MG RE SUPP
300.0000 mg | Freq: Every day | RECTAL | Status: DC
  Filled 2013-02-02 (×2): qty 1

## 2013-02-02 MED ORDER — DEXTROSE 5 % IV SOLN
1.0000 g | INTRAVENOUS | Status: AC
  Administered 2013-02-02 – 2013-02-03 (×2): 1 g via INTRAVENOUS
  Filled 2013-02-02 (×3): qty 10

## 2013-02-02 MED ORDER — SENNOSIDES-DOCUSATE SODIUM 8.6-50 MG PO TABS
1.0000 | ORAL_TABLET | Freq: Every evening | ORAL | Status: DC | PRN
  Filled 2013-02-02: qty 1

## 2013-02-02 MED ORDER — ASPIRIN 325 MG PO TABS
325.0000 mg | ORAL_TABLET | Freq: Every day | ORAL | Status: DC
  Administered 2013-02-02 – 2013-02-03 (×2): 325 mg via ORAL
  Filled 2013-02-02 (×2): qty 1

## 2013-02-02 MED ORDER — MELOXICAM 15 MG PO TABS
15.0000 mg | ORAL_TABLET | Freq: Every day | ORAL | Status: DC
  Administered 2013-02-02 – 2013-02-07 (×6): 15 mg via ORAL
  Filled 2013-02-02 (×6): qty 1

## 2013-02-02 MED ORDER — LORAZEPAM 2 MG/ML IJ SOLN
0.2500 mg | Freq: Once | INTRAMUSCULAR | Status: AC
  Administered 2013-02-02: 0.25 mg via INTRAVENOUS

## 2013-02-02 MED ORDER — SODIUM CHLORIDE 0.9 % IV SOLN
Freq: Once | INTRAVENOUS | Status: AC
  Administered 2013-02-02: 50 mL/h via INTRAVENOUS

## 2013-02-02 MED ORDER — SODIUM CHLORIDE 0.9 % IV SOLN
INTRAVENOUS | Status: DC
  Administered 2013-02-02: 16:00:00 via INTRAVENOUS

## 2013-02-02 NOTE — ED Notes (Signed)
PT was able to tell MD her name. Moves extremities without difficulty.

## 2013-02-02 NOTE — H&P (Addendum)
[facility] History and Physical  MICHELL GIULIANO ZOX:[id number] DOB: 1914/07/28 DOA: 02/02/2013  Referring physician: Dr Jeraldine Loots PCP: Pamelia Hoit, MD  Specialists:   Chief Complaint:  AMS  HPI: Holly Allen is a 79 y.o. female with history of TIA, hypertension who presents with altered mental status. History is obtained from family at bedside. They reported that on day prior to admission-patient seemed to be at her baseline ambulating with a walker her till later in the day are when they noted she had some slurred speech and also left hand/upper extremity clumsiness. As the evening went by her son states that she had difficulty finding and putting on her pajamas and he had to help her. Subsequently sometime about 2 AM he states, that he heard what seemed be like she was going to bedside commode, and didn't think too much about it. Later this morning when he woke up and checked on her she was still sitting on the commode and from the appearance of her legs she had been sitting there for a long time. He states she was incoherent and was not responding much verbally. So he helped her to the bed and they called EMS. In the ED the patient had a CT scan which showed question recent infarct in the periphery of the white matter of the mid superior right centrum semiovale, sodium 126, urinalysis trace leukocyte esterase with 3-6 WBCs and many bacteria. Neurology was consulted, and arm admission to [facility] are requested.  Review of Systems: As per history of present illness, otherwise on obtainable.  Past Medical History  Diagnosis Date  . Hypertension   . TIA (transient ischemic attack)    Past Surgical History  Procedure Laterality Date  . Hip arthroplasty  03/17/2012    Procedure: ARTHROPLASTY BIPOLAR HIP;  Surgeon: Kathryne Hitch, MD;  Location: [facility] OR;  Service: Orthopedics;  Laterality: Right;  . Ankle fracture surgery     Social History:  reports that she has never smoked. She  has never used smokeless tobacco. She reports that she does not drink alcohol or use illicit drugs.  where does patient live-at home with her son   Allergies  Allergen Reactions  . Codeine     vomits  . Other Other (See Comments)    Muscle relaxant may have caused her stroke per family    Family history- parents had "heart problems"  Prior to Admission medications   Medication Sig Start Date End Date Taking? Authorizing Provider  aspirin EC 81 MG tablet Take 81 mg by mouth 2 (two) times daily.    Yes Historical Provider, MD  cholecalciferol (VITAMIN D) 1000 UNITS tablet Take 1,000 Units by mouth daily.   Yes Historical Provider, MD  meloxicam (MOBIC) 15 MG tablet Take 15 mg by mouth daily.   Yes Historical Provider, MD  metoprolol (LOPRESSOR) 50 MG tablet Take 50 mg by mouth 2 (two) times daily.   Yes Historical Provider, MD  pravastatin (PRAVACHOL) 20 MG tablet Take 20 mg by mouth daily.   Yes Historical Provider, MD  traMADol (ULTRAM) 50 MG tablet Take 25-50 mg by mouth every 6 (six) hours as needed for pain.    Yes Historical Provider, MD   Physical Exam: Filed Vitals:   02/02/13 1400  BP: 156/89  Pulse: 100  Temp:   Resp: 14    Constitutional: Vital signs reviewed.  Patient is a well-developed and well-nourished  in no acute distress and cooperative with exam.  somnolent but easily  are oriented x 1 Head: Normocephalic and atraumatic Nose: No erythema or drainage noted.  Turbinates normal Mouth: no erythema or exudates, MMM Eyes: PERRL, EOMI, conjunctivae normal, No scleral icterus.  Neck: Supple, Trachea midline normal ROM, No JVD, mass, thyromegaly, or carotid bruit present.  Cardiovascular: RRR, S1 normal, S2 normal, no MRG, pulses symmetric and intact bilaterally Pulmonary/Chest: normal respiratory effort, CTAB, no wheezes, rales, or rhonchi Abdominal: Soft. Suprapubic tenderness present, non-distended, bowel sounds are normal, no masses, organomegaly, or guarding  present.  GU: no CVA tenderness Extremities: Small patches of erythema on lower legs, nontender no warmth and no drainage.no cyanosis, trace edema. Hematology: no cervical, inginal, or axillary adenopathy.  Neurological: Somnolent but easily aroused, she follows simple commands strength 4-5/5 , cranial nerve II-XII are grossly intact, no focal motor deficit, sensory intact to light touch bilaterally.  Skin: Warm, dry and intact. No rash.  Psychiatric: Normal mood and affect.   Labs on Admission:  Basic Metabolic Panel:  Recent Labs Lab 02/02/13 0900  NA 126*  K 3.8  CL 88*  CO2 27  GLUCOSE 177*  BUN 14  CREATININE 0.72  CALCIUM 9.5   Liver Function Tests:  Recent Labs Lab 02/02/13 0900  AST 14  ALT 10  ALKPHOS 160*  BILITOT 1.1  PROT 6.6  ALBUMIN 3.4*   No results found for this basename: LIPASE, AMYLASE,  in the last 168 hours No results found for this basename: AMMONIA,  in the last 168 hours CBC:  Recent Labs Lab 02/02/13 0900  WBC 9.6  HGB 14.6  HCT 41.0  MCV 82.3  PLT 216   Cardiac Enzymes: No results found for this basename: CKTOTAL, CKMB, CKMBINDEX, TROPONINI,  in the last 168 hours  BNP (last 3 results) No results found for this basename: PROBNP,  in the last 8760 hours CBG: No results found for this basename: GLUCAP,  in the last 168 hours  Radiological Exams on Admission: Ct Head Wo Contrast  02/02/2013   *RADIOLOGY REPORT*  Clinical Data: Slurred speech and altered mental status  CT HEAD WITHOUT CONTRAST  Technique:  Contiguous axial images were obtained from the base of the skull through the vertex without contrast. Study was obtained within 24 hours of patient arrival at the emergency department.  Comparison: Pain CT March 22, 2012 and brain MRI March 23, 2012  Findings:  There is mild diffuse atrophy.  There is no mass, hemorrhage, extra-axial fluid collection, or midline shift.  There is small vessel disease throughout the centra  semiovale bilaterally.  There is some more recent appearing small vessel disease in the mid superior right centrum semiovale.  A recent infarct in this area cannot be excluded on this study.  There is no focal vascular distribution type infarct on this study.  Bony calvarium appears intact.  The mastoid air cells are clear. There are cataracts present bilaterally.  IMPRESSION: Atrophy with small vessel disease.  Question recent infarct in the periphery of the white matter of the mid superior right centrum semiovale.  No hemorrhage or mass effect.   Original Report Authenticated By: Bretta Bang, M.D.   Dg Chest Port 1 View  02/02/2013   *RADIOLOGY REPORT*  Clinical Data: Weakness, inability to communicate  PORTABLE CHEST - 1 VIEW  Comparison: Chest x-ray of 01/18/2013  Findings: The lungs remain somewhat hyperaerated.  No infiltrate or effusion is seen.  Mild cardiomegaly is stable.  Prominent pulmonary artery segments remain most consistent with a degree of pulmonary arterial  hypertension.  The bones are osteopenic. Lower thoracic vertebroplasties are noted.  IMPRESSION: Hyperaeration consistent with emphysema.  No active lung disease. Stable mild cardiomegaly.   Original Report Authenticated By: Dwyane Dee, M.D.    EKG: Independently reviewed- Normal sinus rhythm at rate of 84 with PVCs, no acute ischemic changes.  Assessment/Plan Active Problems: Acute encephalopathy -As discussed above, metabolic versus infection versus neuro/CVA -CT head with? Infarct-obtain MRI and follow  -Hydrate with normal saline and follow recheck further evaluate as appropriate if no improvement in sodium, check TSH. -Will start on empiric antibiotics for possible UTI pending cultures -Also cycle troponins, follow Hyponatremia -Hydrate as above, follow recheck -Check TSH  Probable UTI (urinary tract infection) -As above empiric antibiotics pending culture   Hypertension -Hold antihypertensives for now for  permissive hypertension pending MRI History of COPD -Stable     Code Status: Full Family Communication: Daughters and son at bedside Disposition Plan: Admit to tele  Time spent: >69mins  Kela Millin [facility] Pager [phone]  If 7PM-7AM, please contact night-coverage [email or web address] Password [email or web address] 02/02/2013, 4:16 PM

## 2013-02-02 NOTE — Consult Note (Signed)
NEURO HOSPITALIST CONSULT NOTE    Reason for Consult: AMS  HPI:                                                                                                                                          Holly Allen is an 79 y.o. female who was last normal yesterday.  PEr son who is at bedside, patient fell on Sunday but had no residual SE from fall.  She was normal on Monday but Tuesday she seemed more fatigued than usual.  At 5PM on Tuesday family noted she seemed less alert and had some possible left arm weakness.  Patient went to sleep and awoke still not herself this AM.  Son placed her on the commode and noted she did not get up for some time.  When he returned to see how she was, he noticed she was not responding to him, following no commands and nonverbal. She was brought to [place] for further evaluation.  Initial CMP showed sodium 126, and many bacteria in Urine--culture pending.  On consultation patient did not blink to threat on the right but otherwise was non-focal.  She would not follow commands either verbal or visual. CT head showed question recent infarct in the periphery of the white matter of the mid superior right centrum semiovale.   Past Medical History  Diagnosis Date  . Hypertension   . TIA (transient ischemic attack)     Past Surgical History  Procedure Laterality Date  . Hip arthroplasty  03/17/2012    Procedure: ARTHROPLASTY BIPOLAR HIP;  Surgeon: Kathryne Hitch, MD;  Location: [facility] OR;  Service: Orthopedics;  Laterality: Right;  . Ankle fracture surgery      Family History: Mother HTN Father HTN  Social History:  reports that she has never smoked. She has never used smokeless tobacco. She reports that she does not drink alcohol or use illicit drugs.  Allergies  Allergen Reactions  . Codeine     vomits  . Other Other (See Comments)    Muscle relaxant may have caused her stroke per family    MEDICATIONS:  No current facility-administered medications for this encounter.   Current Outpatient Prescriptions  Medication Sig Dispense Refill  . aspirin EC 81 MG tablet Take 81 mg by mouth 2 (two) times daily.       . cholecalciferol (VITAMIN D) 1000 UNITS tablet Take 1,000 Units by mouth daily.      . meloxicam (MOBIC) 15 MG tablet Take 15 mg by mouth daily.      . metoprolol (LOPRESSOR) 50 MG tablet Take 50 mg by mouth 2 (two) times daily.      . pravastatin (PRAVACHOL) 20 MG tablet Take 20 mg by mouth daily.      . traMADol (ULTRAM) 50 MG tablet Take 25-50 mg by mouth every 6 (six) hours as needed for pain.          ROS:                                                                                                                                       History obtained from family  General ROS: positive for -  fatigue,  Psychological ROS: negative for - behavioral disorder, hallucinations, memory difficulties, mood swings or suicidal ideation Ophthalmic ROS: negative for - blurry vision, double vision, eye pain or loss of vision ENT ROS: negative for - epistaxis, nasal discharge, oral lesions, sore throat, tinnitus or vertigo Allergy and Immunology ROS: negative for - hives or itchy/watery eyes Hematological and Lymphatic ROS: negative for - bleeding problems, bruising or swollen lymph nodes Endocrine ROS: negative for - galactorrhea, hair pattern changes, polydipsia/polyuria or temperature intolerance Respiratory ROS: negative for - cough, hemoptysis, shortness of breath or wheezing Cardiovascular ROS: negative for - chest pain, dyspnea on exertion, edema or irregular heartbeat Gastrointestinal ROS: negative for - abdominal pain, diarrhea, hematemesis, nausea/vomiting or stool incontinence Genito-Urinary ROS: negative for - dysuria, hematuria, incontinence or urinary  frequency/urgency Musculoskeletal ROS: negative for - joint swelling or muscular weakness Neurological ROS: as noted in HPI Dermatological ROS: negative for rash and skin lesion changes   Blood pressure 148/67, pulse 86, temperature 96.5 F (35.8 C), temperature source Rectal, resp. rate 14, SpO2 96.00%.   Neurologic Examination:                                                                                                      Mental Status: Alert, not oriented, answered her name appropriately but was otherwise nonverbal.follows no commands Cranial Nerves: II: Discs flat bilaterally; Visual fields shows a right visual file cut,  pupils equal, round, reactive to light and accommodation III,IV, VI: ptosis not present, extra-ocular motions intact bilaterally V,VII: face symmetric, facial light touch sensation normal bilaterally VIII: hearing decreased bilaterally IX,X: gag reflex present XI: bilateral shoulder shrug XII: midline tongue extension Motor: Moving all extremities spontaneously and purposefully,  Actual strength grade hard to perform but to noxious stimuli patient showed 4-/5 strength. Tone and bulk:normal tone throughout; no atrophy noted Sensory: withdraws from noxious stimuli in all extremities Deep Tendon Reflexes:  Right: Upper Extremity   Left: Upper extremity   biceps (C-5 to C-6) 2/4   biceps (C-5 to C-6) 2/4 tricep (C7) 2/4    triceps (C7) 2/4 Brachioradialis (C6) 2/4  Brachioradialis (C6) 2/4  Lower Extremity Lower Extremity  quadriceps (L-2 to L-4) 2/4   quadriceps (L-2 to L-4) 2/4 Achilles (S1) 0/4   Achilles (S1) 0/4  Plantars: Right: downgoing   Left: downgoing Cerebellar: Unable to assess CV: pulses palpable throughout    Lab Results  Component Value Date/Time   CHOL 129 03/23/2012  5:00 AM    Results for orders placed during the hospital encounter of 02/02/13 (from the past 48 hour(s))  CBC     Status: None   Collection Time    02/02/13  9:00  AM      Result Value Range   WBC 9.6  4.0 - 10.5 K/uL   RBC 4.98  3.87 - 5.11 MIL/uL   Hemoglobin 14.6  12.0 - 15.0 g/dL   HCT 95.6  21.3 - 08.6 %   MCV 82.3  78.0 - 100.0 fL   MCH 29.3  26.0 - 34.0 pg   MCHC 35.6  30.0 - 36.0 g/dL   RDW 57.8  46.9 - 62.9 %   Platelets 216  150 - 400 K/uL  COMPREHENSIVE METABOLIC PANEL     Status: Abnormal   Collection Time    02/02/13  9:00 AM      Result Value Range   Sodium 126 (*) 135 - 145 mEq/L   Potassium 3.8  3.5 - 5.1 mEq/L   Chloride 88 (*) 96 - 112 mEq/L   CO2 27  19 - 32 mEq/L   Glucose, Bld 177 (*) 70 - 99 mg/dL   BUN 14  6 - 23 mg/dL   Creatinine, Ser 5.28  0.50 - 1.10 mg/dL   Calcium 9.5  8.4 - 41.3 mg/dL   Total Protein 6.6  6.0 - 8.3 g/dL   Albumin 3.4 (*) 3.5 - 5.2 g/dL   AST 14  0 - 37 U/L   ALT 10  0 - 35 U/L   Alkaline Phosphatase 160 (*) 39 - 117 U/L   Total Bilirubin 1.1  0.3 - 1.2 mg/dL   GFR calc non Af Amer 71 (*) >90 mL/min   GFR calc Af Amer 82 (*) >90 mL/min   Comment:            The eGFR has been calculated     using the CKD EPI equation.     This calculation has not been     validated in all clinical     situations.     eGFR's persistently     <90 mL/min signify     possible Chronic Kidney Disease.  URINALYSIS, ROUTINE W REFLEX MICROSCOPIC     Status: Abnormal   Collection Time    02/02/13  9:00 AM      Result Value Range   Color, Urine YELLOW  YELLOW  APPearance CLOUDY (*) CLEAR   Specific Gravity, Urine 1.010  1.005 - 1.030   pH 7.0  5.0 - 8.0   Glucose, UA 100 (*) NEGATIVE mg/dL   Hgb urine dipstick SMALL (*) NEGATIVE   Bilirubin Urine NEGATIVE  NEGATIVE   Ketones, ur 15 (*) NEGATIVE mg/dL   Protein, ur 960 (*) NEGATIVE mg/dL   Urobilinogen, UA 0.2  0.0 - 1.0 mg/dL   Nitrite NEGATIVE  NEGATIVE   Leukocytes, UA TRACE (*) NEGATIVE  URINE MICROSCOPIC-ADD ON     Status: Abnormal   Collection Time    02/02/13  9:00 AM      Result Value Range   WBC, UA 3-6  <3 WBC/hpf   RBC / HPF 3-6  <3  RBC/hpf   Bacteria, UA MANY (*) RARE  LACTIC ACID, PLASMA     Status: Abnormal   Collection Time    02/02/13  9:12 AM      Result Value Range   Lactic Acid, Venous 2.5 (*) 0.5 - 2.2 mmol/L  POCT I-STAT TROPONIN I     Status: None   Collection Time    02/02/13 10:49 AM      Result Value Range   Troponin i, poc 0.01  0.00 - 0.08 ng/mL   Comment 3            Comment: Due to the release kinetics of cTnI,     a negative result within the first hours     of the onset of symptoms does not rule out     myocardial infarction with certainty.     If myocardial infarction is still suspected,     repeat the test at appropriate intervals.    Ct Head Wo Contrast  02/02/2013   *RADIOLOGY REPORT*  Clinical Data: Slurred speech and altered mental status  CT HEAD WITHOUT CONTRAST  Technique:  Contiguous axial images were obtained from the base of the skull through the vertex without contrast. Study was obtained within 24 hours of patient arrival at the emergency department.  Comparison: Pain CT March 22, 2012 and brain MRI March 23, 2012  Findings:  There is mild diffuse atrophy.  There is no mass, hemorrhage, extra-axial fluid collection, or midline shift.  There is small vessel disease throughout the centra semiovale bilaterally.  There is some more recent appearing small vessel disease in the mid superior right centrum semiovale.  A recent infarct in this area cannot be excluded on this study.  There is no focal vascular distribution type infarct on this study.  Bony calvarium appears intact.  The mastoid air cells are clear. There are cataracts present bilaterally.  IMPRESSION: Atrophy with small vessel disease.  Question recent infarct in the periphery of the white matter of the mid superior right centrum semiovale.  No hemorrhage or mass effect.   Original Report Authenticated By: Bretta Bang, M.D.   Dg Chest Port 1 View  02/02/2013   *RADIOLOGY REPORT*  Clinical Data: Weakness, inability to  communicate  PORTABLE CHEST - 1 VIEW  Comparison: Chest x-ray of 01/18/2013  Findings: The lungs remain somewhat hyperaerated.  No infiltrate or effusion is seen.  Mild cardiomegaly is stable.  Prominent pulmonary artery segments remain most consistent with a degree of pulmonary arterial hypertension.  The bones are osteopenic. Lower thoracic vertebroplasties are noted.  IMPRESSION: Hyperaeration consistent with emphysema.  No active lung disease. Stable mild cardiomegaly.   Original Report Authenticated By: Dwyane Dee, M.D.    Assessment and  plan discussed with with attending physician and they are in agreement.    Felicie Morn PA-C [facility] [phone]  02/02/2013, 12:08 PM  I have seen and evaluated the patient. I have reviewed the above note and made appropriate changes.   Assessment/Plan: [name] with progressive AMS and some left sided weakness. She has a history of CVA causing left sided weakness and I suspect a metabolic encephalopathy with superimposed "peeling the onion" effect(temporary worsening of previously improved stroke deficits). An MRI could help to determine if a new stroke has indeed occurred. Possible etiologies include dehydration, hyponatremia, infection.   1) TSH 2) MRI brain.  3) Evaluation for infection per ER/IM 4) Treatment of hyponatremia.    Ritta Slot, MD [facility] [phone]  If 7pm- 7am, please page neurology on call at [phone].

## 2013-02-02 NOTE — ED Provider Notes (Signed)
CSN: [id number]     Arrival date & time 02/02/13  4098 History     First MD Initiated Contact with Patient 02/02/13 [phone]     Chief Complaint  Patient presents with  . Altered Mental Status   HPI 79 year old female with history of HTN and TIA presents to the ED with concern for CVA.  Level 5 Caveat as patient is not able to provide history.  History obtained via medical record and family.  Family reports that Mrs. Doukas was her normal self yesterday and participated in [facility] PT.  Yesterday evening, daughter noticed some difficultly with speech and left arm weakness.  The son then stayed with the patient overnight, and felt that she was normal/at her baseline.  This am, the daughter returned to the house and found the patient on the bedside commode.  She was minimally responsive and not following commands.  EMS was then called and she was brought to the ED for evaluation.  Past Medical History  Diagnosis Date  . Hypertension   . TIA (transient ischemic attack)    Past Surgical History  Procedure Laterality Date  . Hip arthroplasty  03/17/2012    Procedure: ARTHROPLASTY BIPOLAR HIP;  Surgeon: Kathryne Hitch, MD;  Location: [facility] OR;  Service: Orthopedics;  Laterality: Right;  . Ankle fracture surgery     No family history on file. History  Substance Use Topics  . Smoking status: Never Smoker   . Smokeless tobacco: Never Used  . Alcohol Use: No   OB History   Grav Para Term Preterm Abortions TAB SAB Ect Mult Living                 Review of Systems Per HPI.  Remainder of ROS unable to be obtained.  Allergies  Codeine and Other  Home Medications   Current Outpatient Rx  Name  Route  Sig  Dispense  Refill  . aspirin EC 81 MG tablet   Oral   Take 81 mg by mouth 2 (two) times daily.          . cholecalciferol (VITAMIN D) 1000 UNITS tablet   Oral   Take 1,000 Units by mouth daily.         . meloxicam (MOBIC) 15 MG tablet   Oral   Take 15 mg by mouth daily.          . metoprolol (LOPRESSOR) 50 MG tablet   Oral   Take 50 mg by mouth 2 (two) times daily.         . pravastatin (PRAVACHOL) 20 MG tablet   Oral   Take 20 mg by mouth daily.         . traMADol (ULTRAM) 50 MG tablet   Oral   Take 25-50 mg by mouth every 6 (six) hours as needed for pain.           BP 130/68  Pulse 126  Temp(Src) 96.3 F (35.7 C) (Rectal)  Resp 14  SpO2 100% Physical Exam  Constitutional:  Frail appear elderly lady, resting in bed.  Appears in NAD.  HENT:  Head: Normocephalic and atraumatic.  Eyes:  Pupils sluggish but equal and reactive.  Cardiovascular: Normal rate and regular rhythm.   No murmur heard. Pulmonary/Chest: Effort normal and breath sounds normal. She has no wheezes. She has no rales.  Abdominal: Soft. She exhibits no distension and no mass. There is no guarding.  Diffusely tender to palpation.  Musculoskeletal:  2+ Pedal  edema.  Neurological:  Patient opens eyes intermittently to questioning.   Patient able to tell me her name, but did not answer other questions appropriately. Appears to move all extremities well and symmetrically.    Skin: Skin is warm and dry.   ED Course   Procedures (including critical care time)  Labs Reviewed  URINALYSIS, ROUTINE W REFLEX MICROSCOPIC - Abnormal; Notable for the following:    APPearance CLOUDY (*)    Glucose, UA 100 (*)    Hgb urine dipstick SMALL (*)    Ketones, ur 15 (*)    Protein, ur 100 (*)    Leukocytes, UA TRACE (*)    All other components within normal limits  LACTIC ACID, PLASMA - Abnormal; Notable for the following:    Lactic Acid, Venous 2.5 (*)    All other components within normal limits  URINE MICROSCOPIC-ADD ON - Abnormal; Notable for the following:    Bacteria, UA MANY (*)    All other components within normal limits  CULTURE, BLOOD (ROUTINE X 2)  CULTURE, BLOOD (ROUTINE X 2)  URINE CULTURE  URINE CULTURE  CBC  COMPREHENSIVE METABOLIC PANEL   Ct Head Wo  Contrast  02/02/2013   *RADIOLOGY REPORT*  Clinical Data: Slurred speech and altered mental status  CT HEAD WITHOUT CONTRAST  Technique:  Contiguous axial images were obtained from the base of the skull through the vertex without contrast. Study was obtained within 24 hours of patient arrival at the emergency department.  Comparison: Pain CT March 22, 2012 and brain MRI March 23, 2012  Findings:  There is mild diffuse atrophy.  There is no mass, hemorrhage, extra-axial fluid collection, or midline shift.  There is small vessel disease throughout the centra semiovale bilaterally.  There is some more recent appearing small vessel disease in the mid superior right centrum semiovale.  A recent infarct in this area cannot be excluded on this study.  There is no focal vascular distribution type infarct on this study.  Bony calvarium appears intact.  The mastoid air cells are clear. There are cataracts present bilaterally.  IMPRESSION: Atrophy with small vessel disease.  Question recent infarct in the periphery of the white matter of the mid superior right centrum semiovale.  No hemorrhage or mass effect.   Original Report Authenticated By: Bretta Bang, M.D.   Dg Chest Port 1 View  02/02/2013   *RADIOLOGY REPORT*  Clinical Data: Weakness, inability to communicate  PORTABLE CHEST - 1 VIEW  Comparison: Chest x-ray of 01/18/2013  Findings: The lungs remain somewhat hyperaerated.  No infiltrate or effusion is seen.  Mild cardiomegaly is stable.  Prominent pulmonary artery segments remain most consistent with a degree of pulmonary arterial hypertension.  The bones are osteopenic. Lower thoracic vertebroplasties are noted.  IMPRESSION: Hyperaeration consistent with emphysema.  No active lung disease. Stable mild cardiomegaly.   Original Report Authenticated By: Dwyane Dee, M.D.   No diagnosis found.  MDM  79 year old female presents with acute encephalopathy and concern for CVA. - Given limited history,  obtaining stroke and sepsis work up: CT head, CBC, CMP, Blood culture x 2, UA, Lactic acid. - CT head revealed ? Infarct in the periphery of the white matter of the mid superior right centrum semiovale.  Chest xray (see findings above) revealed no infiltrate.   - Lactic acid elevated at 2.5.  UA revealed trace leukocytes.  Awaiting culture results. - Patient likely has suffered a stroke given CT findings.  Will order MRI  and discuss with neurology. - Discussed with [facility]. Patient to be admitted to neuro tele.  Tommie Sams, DO 02/02/13 1050

## 2013-02-02 NOTE — ED Provider Notes (Signed)
I saw and evaluated the patient with the resident physician, Dr. Adriana Simas.  I saw and interpreted relevant studies and EKG, as necessary.  I agree with the interpretation. The documentation is appropriate and accurate.  EKG had sinus rhythm, rate 84, multiple premature ventricular complexes, no significant changes from prior, abnormal   This elderly female presents with concern for altered mental status.  Per the patient's initial head CT showed no focal hemorrhage, and only concern for subacute infarct, she is admitted for further evaluation and management.  Gerhard Munch, MD 02/02/13 1544

## 2013-02-02 NOTE — Evaluation (Signed)
Clinical/Bedside Swallow Evaluation Patient Details  Name: Holly Allen MRN: [id number] Date of Birth: [birth year]  Today's Date: 02/02/2013 Time: 4098-1191 SLP Time Calculation (min): 16 min  Past Medical History:  Past Medical History  Diagnosis Date  . Hypertension   . TIA (transient ischemic attack)    Past Surgical History:  Past Surgical History  Procedure Laterality Date  . Hip arthroplasty  03/17/2012    Procedure: ARTHROPLASTY BIPOLAR HIP;  Surgeon: Kathryne Hitch, MD;  Location: [facility] OR;  Service: Orthopedics;  Laterality: Right;  . Ankle fracture surgery     HPI:  [name] with progressive AMS and some left sided weakness. She has a history of CVA  (cerebellar stroke 2007) causing left sided weakness; MD suspects a metabolic encephalopathy with superimposed "peeling the onion" effect (temporary worsening of previously improved stroke deficits).  MRI pending.  Family reports baseline difficulty with mastication, swallowing pills, and belching with PO consumption.  Assessment / Plan / Recommendation Clinical Impression  Pt presents with mental status changes interfering with swallow function.  Oral phase of swallow is prolonged (this is baseline per family), swallow trigger appears to be delayed, and there are signs of potential pharyngeal weakness and decreased airway protection with thin liquids.  For now, recommend initiating a dysphagia 1 diet with nectar thick liquids; meds crushed with puree.  Full supervision, as pt had difficulty with utensil use (attempting to use spoon as a straw.) SLP will f/u next date for diet advancement/improved PO toleration.  Discussed with family, who agree with plan.    Aspiration Risk  Moderate    Diet Recommendation Dysphagia 1 (Puree);Nectar-thick liquid   Liquid Administration via: Cup Medication Administration: Crushed with puree Supervision: Full supervision/cueing for compensatory strategies Compensations: Slow rate;Small  sips/bites Postural Changes and/or Swallow Maneuvers: Seated upright 90 degrees    Other  Recommendations Oral Care Recommendations: Oral care BID Other Recommendations: Order thickener from pharmacy   Follow Up Recommendations  Other (comment) (tba)    Frequency and Duration min 2x/week  1 week   Pertinent Vitals/Pain No pain   SLP Swallow Goals Patient will utilize recommended strategies during swallow to increase swallowing safety with: Supervision/safety   Swallow Study Prior Functional Status       General Date of Onset: 02/02/13 HPI: [name] with progressive AMS and some left sided weakness. She has a history of CVA  (cerebellar stroke 2007) causing left sided weakness and I suspect a metabolic encephalopathy with superimposed "peeling the onion" effect(temporary worsening of previously improved stroke deficits).  Type of Study: Bedside swallow evaluation Previous Swallow Assessment: 03/22/12 bedside swallow eval revealing primarily oral dysphagia with prolonged mastication Diet Prior to this Study: NPO Temperature Spikes Noted: No Respiratory Status: Room air History of Recent Intubation: No Behavior/Cognition: Alert;Confused Oral Cavity - Dentition: Missing dentition Self-Feeding Abilities: Needs assist Patient Positioning: Upright in bed Baseline Vocal Quality: Clear Volitional Cough: Strong Volitional Swallow: Able to elicit    Oral/Motor/Sensory Function Overall Oral Motor/Sensory Function: Appears within functional limits for tasks assessed   Ice Chips Ice chips: Within functional limits   Thin Liquid Thin Liquid: Impaired Presentation: Spoon;Straw Pharyngeal  Phase Impairments: Suspected delayed Swallow;Multiple swallows;Throat Clearing - Immediate    Nectar Thick Nectar Thick Liquid: Impaired Pharyngeal Phase Impairments: Suspected delayed Swallow;Multiple swallows   Honey Thick Honey Thick Liquid: Not tested   Puree Puree: Impaired Presentation:  Spoon Pharyngeal Phase Impairments: Multiple swallows   Solid  Kash Mothershead L. Samson Frederic, MA CCC/SLP Pager  [phone]  GO    Solid: Not tested       Blenda Mounts Laurice 02/02/2013,4:51 PM

## 2013-02-02 NOTE — ED Notes (Addendum)
PT was eatting dinner last night and started slurring speech per family. This morning she is responsive to verbal stimuli only. She will open eyes when asked but that's it. Baseline per family is ambulatory and speaking. Pt is full code. Pt has hx of multiple TIA's. CBG 141.

## 2013-02-03 DIAGNOSIS — G9340 Encephalopathy, unspecified: Secondary | ICD-10-CM

## 2013-02-03 DIAGNOSIS — I10 Essential (primary) hypertension: Secondary | ICD-10-CM

## 2013-02-03 DIAGNOSIS — J449 Chronic obstructive pulmonary disease, unspecified: Secondary | ICD-10-CM

## 2013-02-03 LAB — URINE CULTURE: Colony Count: >=100000

## 2013-02-03 LAB — BASIC METABOLIC PANEL
BUN: 17 mg/dL (ref 6–23)
GFR calc Af Amer: 81 mL/min — ABNORMAL LOW (ref >90)
GFR calc non Af Amer: 70 mL/min — ABNORMAL LOW (ref >90)
Potassium: 3.6 mEq/L (ref 3.5–5.1)
Sodium: 131 mEq/L — ABNORMAL LOW (ref 135–145)

## 2013-02-03 LAB — TROPONIN I: Troponin I: <0.3 ng/mL (ref <0.30)

## 2013-02-03 LAB — LIPID PANEL
Cholesterol: 140 mg/dL (ref 0–200)
LDL Cholesterol: 63 mg/dL (ref 0–99)
Total CHOL/HDL Ratio: 2.9 RATIO
VLDL: 28 mg/dL (ref 0–40)

## 2013-02-03 MED ORDER — METOPROLOL TARTRATE 1 MG/ML IV SOLN
5.0000 mg | Freq: Once | INTRAVENOUS | Status: AC
  Administered 2013-02-03: 5 mg via INTRAVENOUS
  Filled 2013-02-03: qty 5

## 2013-02-03 MED ORDER — RESOURCE THICKENUP CLEAR PO POWD
ORAL | Status: DC | PRN
  Filled 2013-02-03: qty 125

## 2013-02-03 MED ORDER — CLOPIDOGREL BISULFATE 75 MG PO TABS
75.0000 mg | ORAL_TABLET | Freq: Every day | ORAL | Status: DC
  Administered 2013-02-04 – 2013-02-07 (×4): 75 mg via ORAL
  Filled 2013-02-03 (×4): qty 1

## 2013-02-03 MED ORDER — ENSURE PUDDING PO PUDG
1.0000 | Freq: Three times a day (TID) | ORAL | Status: DC
  Administered 2013-02-04 – 2013-02-07 (×7): 1 via ORAL

## 2013-02-03 NOTE — Progress Notes (Addendum)
Clinical Social Work Department CLINICAL SOCIAL WORK PLACEMENT NOTE 02/03/2013  Patient:  Holly Allen, Holly Allen  Account Number:  [id number] Admit date:  02/02/2013  Clinical Social Worker:  Theresia Bough, Theresia Majors  Date/time:  02/03/2013 02:54 PM  Clinical Social Work is seeking post-discharge placement for this patient at the following level of care:   SKILLED NURSING   (*CSW will update this form in Epic as items are completed)   02/03/2013  Patient/family provided with [facility] Department of Clinical Social Work's list of facilities offering this level of care within the geographic area requested by the patient (or if unable, by the patient's family).  02/03/2013  Patient/family informed of their freedom to choose among providers that offer the needed level of care, that participate in Medicare, Medicaid or managed care program needed by the patient, have an available bed and are willing to accept the patient.  02/03/2013  Patient/family informed of [facility]' ownership interest in [facility], as well as of the fact that they are under no obligation to receive care at this facility.  PASARR submitted to EDS on 02/03/13 PASARR number received from EDS on 02/03/13  FL2 transmitted to all facilities in geographic area requested by pt/family on  02/03/2013 FL2 transmitted to all facilities within larger geographic area on 02/04/13  Patient informed that his/her managed care company has contracts with or will negotiate with  certain facilities, including the following:     Patient/family informed of bed offers received:   Patient chooses bed at  Physician recommends and patient chooses bed at    Patient to be transferred to  on   Patient to be transferred to facility by   The following physician request were entered in Epic:   Additional Comments: Pt family prefers [facility]  Theresia Bough, MSW, [place] [phone]

## 2013-02-03 NOTE — Evaluation (Signed)
Physical Therapy Evaluation Patient Details Name: Holly Allen MRN: [id number] DOB: 1914/10/01 Today's Date: 02/03/2013 Time: 4098-1191 PT Time Calculation (min): 38 min  PT Assessment / Plan / Recommendation History of Present Illness  Patient is a 79 y/o female admitted with weakness, confusion MRI positive for subacute infarcts left high parietal and left internal capsule.    Clinical Impression  Patient presents with decreased balance, decreased strength, decreased awareness and cognition and limited activity tolerance.  She will benefit from skilled PT in the acute setting to maximize independence and allow return home following SNF level rehab.    PT Assessment  Patient needs continued PT services    Follow Up Recommendations  SNF (interested in [facility])    Does the patient have the potential to tolerate intense rehabilitation    N/A  Barriers to Discharge Decreased caregiver support family not able to provide physical assist    Equipment Recommendations  None recommended by PT    Recommendations for Other Services   None  Frequency Min 3X/week    Precautions / Restrictions Precautions Precautions: Fall Precaution Comments: very hard of hearing   Pertinent Vitals/Pain Moderate moaning with mobility, did not specify pain location      Mobility  Bed Mobility Bed Mobility: Supine to Sit;Sit to Supine Supine to Sit: 2: Max assist Sit to Supine: 2: Max assist;HOB flat Details for Bed Mobility Assistance: pt able to assist to get legs to edge of bed and to pull up on me.  To supine, needed assist to get legs in though pt attempting on her own, assist for positioning after supine due to falling to left Transfers Transfers: Sit to Stand;Stand to Sit;Stand Pivot Transfers Sit to Stand: 3: Mod assist;With upper extremity assist;From bed;From chair/3-in-1 Stand to Sit: 3: Mod assist;With upper extremity assist;With armrests;To chair/3-in-1;To bed Stand Pivot  Transfers: 3: Mod assist Details for Transfer Assistance: assist for safety with stand turn with walker to 3:1 and to bed due to pt unsafe and moaning with attempts to stand and not willing to stand for long even for hygiene Ambulation/Gait Ambulation/Gait Assistance: Not tested (comment) Modified Rankin (Stroke Patients Only) Pre-Morbid Rankin Score: Moderate disability Modified Rankin: Severe disability    Exercises General Exercises - Lower Extremity Ankle Circles/Pumps: AAROM;Both;10 reps;Supine Heel Slides: AAROM;Both;10 reps;Supine   PT Diagnosis: Abnormality of gait;Generalized weakness  PT Problem List: Decreased strength;Decreased cognition;Decreased activity tolerance;Decreased safety awareness;Decreased balance;Decreased mobility PT Treatment Interventions: DME instruction;Balance training;Gait training;Neuromuscular re-education;Functional mobility training;Patient/family education;Therapeutic activities;Therapeutic exercise     PT Goals(Current goals can be found in the care plan section) Acute Rehab PT Goals Patient Stated Goal: To return to independent PT Goal Formulation: With patient/family Time For Goal Achievement: 02/17/13 Potential to Achieve Goals: Good  Visit Information  Last PT Received On: 02/03/13 Assistance Needed: +2 (for safety/ambulation (can transfer +1)) History of Present Illness: Patient is a 79 y/o female admitted with weakness, confusion MRI positive for subacute infarcts left high parietal and left internal capsule.         Prior Functioning  Home Living Family/patient expects to be discharged to:: Private residence Living Arrangements: Children Available Help at Discharge: Family Type of Home: House Home Access: Stairs to enter Secretary/administrator of Steps: 4 Entrance Stairs-Rails: Right;Left Home Layout: One level Home Equipment: Bedside commode;Toilet riser;Walker - 2 wheels Additional Comments: sponge bathes, had OT starting, but  admitted prior to start Prior Function Level of Independence: Independent with assistive device(s) Comments: no longer cooking Communication Communication: [facility]  Dominant Hand: Right    Cognition  Cognition Arousal/Alertness: Awake/alert Behavior During Therapy: WFL for tasks assessed/performed Overall Cognitive Status: Impaired/Different from baseline Area of Impairment: Attention Current Attention Level: Focused Following Commands: Follows one step commands inconsistently;Follows one step commands with increased time General Comments: son reports she is still confused    Extremity/Trunk Assessment Upper Extremity Assessment Upper Extremity Assessment: Difficult to assess due to impaired cognition;RUE deficits/detail;LUE deficits/detail RUE Deficits / Details: AAROM WFL, uses arms functionally with transfers, but overall appears weak LUE Deficits / Details: AAROM WFL, uses arms functionally with transfers, but overall appears weak Lower Extremity Assessment Lower Extremity Assessment: Generalized weakness;Difficult to assess due to impaired cognition Cervical / Trunk Assessment Cervical / Trunk Assessment: Kyphotic;Other exceptions Cervical / Trunk Exceptions: poor overall trunk stability   Balance Balance Balance Assessed: Yes Static Sitting Balance Static Sitting - Balance Support: Feet supported;Right upper extremity supported Static Sitting - Level of Assistance: 5: Stand by assistance Static Sitting - Comment/# of Minutes: sat edge of bed with distant supervision while retrieving BSC from bathroom  End of Session PT - End of Session Equipment Utilized During Treatment: Gait belt Activity Tolerance: Patient limited by pain;Patient limited by fatigue Patient left: in bed;with family/visitor present;with call bell/phone within reach Nurse Communication: Mobility status  GP     [facility] 02/03/2013, 12:07 PM Sheran Lawless, PT [phone] 02/03/2013

## 2013-02-03 NOTE — Progress Notes (Signed)
INITIAL NUTRITION ASSESSMENT  DOCUMENTATION CODES Per approved criteria  -Severe  malnutrition in the context of social or environmental circumstances -Underweight   INTERVENTION: Ensure Pudding po TID, each supplement provides 170 kcal and 4 grams of protein.  Continue Magic cup TID on trays, each supplement provides 290 kcal and 9 grams of protein. RD to continue to follow nutrition care plan.  NUTRITION DIAGNOSIS: Unintentional wt loss related to advanced age as evidenced by 12% wt loss x 1 year + severe fat/muscle mass loss.   Goal: Intake to meet >90% of estimated nutrition needs.  Monitor:  weight trends, lab trends, I/O's, PO intake, supplement tolerance  Reason for Assessment: Health History  79 y.o. female  Admitting Dx: AMS  ASSESSMENT: Admitted with acute encephalopathy - metabolic vs infection vs neuro/CVA.  BSE completed by SLP on 8/6 with recommendations for pureed diet and nectar thickened liquids.  Pt with approximately 12% wt loss x 1 year. This is not significant. Discussed oral intake with family at bedside. Pt eats 3 meals a day at baseline with no significant changes in appetite PTA. Family does not keep track of patient's weights. Pt very HOH, spoke with family to obtain nutrition hx.  Family reports pt took just bites off of dinner last evening. Has yet to attempt to eat breakfast this morning.  Nutrition Focused Physical Exam:  Subcutaneous Fat:  Orbital Region: WNL Upper Arm Region: severe depletion Thoracic and Lumbar Region: severe depletion  Muscle:  Temple Region: moderate depletion Clavicle Bone Region: severe depletion Clavicle and Acromion Bone Region: moderate depletion Scapular Bone Region: NA Dorsal Hand: WNL Patellar Region: severe depletion Anterior Thigh Region: NA Posterior Calf Region: NA  Edema: trace edema  Pt meets criteria for severe MALNUTRITION in the context of social/environmental circumstances as evidenced by  severe muscle and fat mass loss.   Height: Ht Readings from Last 1 Encounters:  02/02/13 5\' 4"  (1.626 m)    Weight: Wt Readings from Last 1 Encounters:  02/02/13 101 lb 3.1 oz (45.9 kg)    Ideal Body Weight: 120 lb  % Ideal Body Weight: 84%  Wt Readings from Last 10 Encounters:  02/02/13 101 lb 3.1 oz (45.9 kg)  01/18/13 105 lb (47.628 kg)  12/30/12 105 lb (47.628 kg)  12/13/12 107 lb (48.535 kg)  11/25/12 100 lb (45.36 kg)  03/17/12 115 lb (52.164 kg)  03/17/12 115 lb (52.164 kg)    Usual Body Weight: 115 lb  % Usual Body Weight: 88%  BMI:  Body mass index is 17.36 kg/(m^2). Underweight  Estimated Nutritional Needs: Kcal: 1200 - 1350  Protein: 45 - 55 g Fluid: at least 1.2 liters daily  Skin: intact  Diet Order: Dysphagia 1; Nectar Thickened Liquids  EDUCATION NEEDS: -No education needs identified at this time  No intake or output data in the 24 hours ending 02/03/13 1017  Last BM: 8/4  Labs:   Recent Labs Lab 02/02/13 0900 02/02/13 1550  NA 126*  --   K 3.8  --   CL 88*  --   CO2 27  --   BUN 14  --   CREATININE 0.72 0.75  CALCIUM 9.5  --   GLUCOSE 177*  --     CBG (last 3)  No results found for this basename: GLUCAP,  in the last 72 hours  Scheduled Meds: . aspirin  300 mg Rectal Daily   Or  . aspirin  325 mg Oral Daily  . cefTRIAXone (ROCEPHIN)  IV  1 g Intravenous Q24H  . heparin  5,000 Units Subcutaneous Q8H  . meloxicam  15 mg Oral Daily  . simvastatin  10 mg Oral q1800    Continuous Infusions: . sodium chloride 75 mL/hr (02/03/13 0527)    Past Medical History  Diagnosis Date  . Hypertension   . TIA (transient ischemic attack)     Past Surgical History  Procedure Laterality Date  . Hip arthroplasty  03/17/2012    Procedure: ARTHROPLASTY BIPOLAR HIP;  Surgeon: Kathryne Hitch, MD;  Location: [facility] OR;  Service: Orthopedics;  Laterality: Right;  . Ankle fracture surgery      Jarold Motto MS, RD, LDN Pager:  [phone] After-hours pager: [phone]

## 2013-02-03 NOTE — Progress Notes (Signed)
Stroke Team Progress Note  HISTORY Holly Allen is an 80 y.o. female who was last normal yesterday 02/01/2013. Per son who is at bedside, patient fell on Sunday but had no residual SE from fall. She was normal on Monday but Tuesday she seemed more fatigued than usual. At 5PM on Tuesday family noted she seemed less alert and had some possible left arm weakness. Patient went to sleep and awoke still not herself this AM. Son placed her on the commode and noted she did not get up for some time. When he returned to see how she was, he noticed she was not responding to him, following no commands and nonverbal. She was brought to [place] for further evaluation. Initial CMP showed sodium 126, and many bacteria in Urine--culture pending. On consultation patient did not blink to threat on the right but otherwise was non-focal. She would not follow commands either verbal or visual. CT head showed question recent infarct in the periphery of the white matter of the mid superior right centrum semiovale. Patient was not a TPA candidate as stroke not considered on admission.  She was admitted for further evaluation and treatment.  SUBJECTIVE Her son is at the bedside.  Overall she feels her condition is stable. She lives with him. He does not feel he can continue to provide care for her in his home at discharge.  OBJECTIVE Most recent Vital Signs: Filed Vitals:   02/03/13 0200 02/03/13 0400 02/03/13 0600 02/03/13 0910  BP: 166/78 154/76 139/51 140/57  Pulse: 101 97 116 103  Temp:   98.8 F (37.1 C) 97.9 F (36.6 C)  TempSrc:   Axillary Oral  Resp: 18  18 18   Height:      Weight:      SpO2: 96% 96% 96% 96%   CBG (last 3)  No results found for this basename: GLUCAP,  in the last 72 hours  IV Fluid Intake:   . sodium chloride 75 mL/hr (02/03/13 0527)    MEDICATIONS  . aspirin  300 mg Rectal Daily   Or  . aspirin  325 mg Oral Daily  . cefTRIAXone (ROCEPHIN)  IV  1 g Intravenous Q24H  . heparin   5,000 Units Subcutaneous Q8H  . meloxicam  15 mg Oral Daily  . simvastatin  10 mg Oral q1800   PRN:  labetalol, senna-docusate, traMADol  Diet:  Dysphagia 1 nectar thick liquids Activity:  OOB  with assistance DVT Prophylaxis:  Lovenox 40 mg sq daily   CLINICALLY SIGNIFICANT STUDIES Basic Metabolic Panel:  Recent Labs Lab 02/02/13 0900 02/02/13 1550  NA 126*  --   K 3.8  --   CL 88*  --   CO2 27  --   GLUCOSE 177*  --   BUN 14  --   CREATININE 0.72 0.75  CALCIUM 9.5  --    Liver Function Tests:  Recent Labs Lab 02/02/13 0900  AST 14  ALT 10  ALKPHOS 160*  BILITOT 1.1  PROT 6.6  ALBUMIN 3.4*   CBC:  Recent Labs Lab 02/02/13 0900 02/02/13 1550  WBC 9.6 8.6  HGB 14.6 14.1  HCT 41.0 39.5  MCV 82.3 81.4  PLT 216 222   Coagulation: No results found for this basename: LABPROT, INR,  in the last 168 hours Cardiac Enzymes:  Recent Labs Lab 02/02/13 1550 02/02/13 2218 02/03/13 0405  TROPONINI <0.30 <0.30 <0.30   Urinalysis:  Recent Labs Lab 02/02/13 0900  COLORURINE YELLOW  LABSPEC 1.010  PHURINE 7.0  GLUCOSEU 100*  HGBUR SMALL*  BILIRUBINUR NEGATIVE  KETONESUR 15*  PROTEINUR 100*  UROBILINOGEN 0.2  NITRITE NEGATIVE  LEUKOCYTESUR TRACE*   Lipid Panel    Component Value Date/Time   CHOL 140 02/03/2013 0405   TRIG 142 02/03/2013 0405   HDL 49 02/03/2013 0405   CHOLHDL 2.9 02/03/2013 0405   VLDL 28 02/03/2013 0405   LDLCALC 63 02/03/2013 0405   HgbA1C  Lab Results  Component Value Date   HGBA1C 6.1* 03/22/2012    Urine Drug Screen:   No results found for this basename: labopia, cocainscrnur, labbenz, amphetmu, thcu, labbarb    Alcohol Level: No results found for this basename: ETH,  in the last 168 hours  CT of the brain  02/02/2013   Atrophy with small vessel disease.  Question recent infarct in the periphery of the white matter of the mid superior right centrum semiovale.  No hemorrhage or mass effect.     MRI of the brain  02/02/2013   : 1.  Late  subacute ischemic infarct involving the posterior limb of the left internal capsule as detailed above. 2.  Small lacunar infarct within the high posterior left parietal lobe, also likely subacute 3.  Atrophy with chronic small vessel ischemic changes, slightly progressed as compared to 03/23/2012     CXR  02/02/2013  Hyperaeration consistent with emphysema.  No active lung disease. Stable mild cardiomegaly.   EKG  normal sinus rhythm  Therapy Recommendations   Physical Exam   Frail elderly Caucasian lady not in distress . Afebrile. Head is nontraumatic. Neck is supple without bruit. . Cardiac exam no murmur or gallop. Lungs are clear to auscultation. Distal pulses are well felt. Neurological Exam ; drowsy and will barely arouses and opens eyes. Speaks a few words and wants to be left alone. Can follow gaze in a direction. Pupils irregular sluggishly reactive. Face appears symmetric. Tongue is midline. Motor system exam does not move extremities voluntarily but can withdraw all 4 limbs well to painful stimuli. No focal weakness noted. Deep tendon reflexes are 2+ symmetric. Both plantar downgoing. Sensation, coordination and gait cannot be reliably tested. ASSESSMENT Ms. Holly Allen is a 79 y.o. female presenting with increased weakness, worsening mental status and possible left arm weakness.  Imaging confirms a left PLIC and tiny left high parietal white matter infarct. Infarcts felt to be thrombotic secondary to small vessel disease.  On aspirin 81 mg bid prior to admission. Now on aspirin 325 mg orally every day for secondary stroke prevention. Patient with resultant lethargy, left hemiparesis. Work up completed.  Hypertension Hx TIA UTI on rocephin  Expect slow recovery. Do not anticipate significant deficits in the future. May need rehab. Do not recommend aggressive testing given advanced age as not a surgical candidate or anticoagulant candidate.  Hospital day #  1  TREATMENT/PLAN  Change aspirin to clopidogrel 75 mg orally every day for secondary stroke prevention.  Social worker consult for SNF placement - pt has been in [facility] in the past and the family was well pleased with her care there.  Annie Main, MSN, RN, ANVP-BC, ANP-BC, [facility] Pager: [phone] 02/03/2013 9:31 AM  I have personally obtained a history, examined the patient, evaluated imaging results, and formulated the assessment and plan of care. I agree with the above. Delia Heady, MD

## 2013-02-03 NOTE — Progress Notes (Signed)
Speech Language Pathology Dysphagia Treatment Patient Details Name: Holly Allen MRN: [id number] DOB: [birth year] Today's Date: 02/03/2013 Time: 1027-2536 SLP Time Calculation (min): 18 min  Assessment / Plan / Recommendation Clinical Impression  F/u after yesterday's swallow eval.  Pt with better arousability; has eaten minimally.  MRI revealed late subacute ischemic infarct involving the posterior limb of the left internal capsule. Small lacunar infarct within the high posterior left parietal lobe, also likely subacute.  Consumed limited amounts of purees and nectar-thick liquids with mod assist for positioning, cup use, cues to maintain safety.  Pt with occasional symptoms of pharyngeal deficits, with intermittent throat-clearing and likely pharyngeal weakness.  Recommend continuing conservative diet for now (dysphagia 1, nectars) until pt is more consistently alert and participatory.  Discussed with family the risks of aspiration associated with CVA, current diet will minimize risk but not eliminate it.  They verbalize understanding.  SLP will follow.     Diet Recommendation  Continue with Current Diet: Dysphagia 1 (puree);Nectar-thick liquid    SLP Plan Continue with current plan of care      Swallowing Goals  SLP Swallowing Goals Patient will utilize recommended strategies during swallow to increase swallowing safety with: Supervision/safety Swallow Study Goal #2 - Progress: Progressing toward goal  General Temperature Spikes Noted: Yes Respiratory Status: Room air Behavior/Cognition: Alert;Confused Oral Cavity - Dentition: Missing dentition Patient Positioning: Upright in bed  Oral Cavity - Oral Hygiene Does patient have any of the following "at risk" factors?: Other - dysphagia Patient is AT RISK - Oral Care Protocol followed (see row info): Yes   Dysphagia Treatment Treatment focused on: Skilled observation of diet tolerance;Patient/family/caregiver education;Facilitation of  pharyngeal phase Family/Caregiver Educated: children Treatment Methods/Modalities: Skilled observation;Differential diagnosis Patient observed directly with PO's: Yes Type of PO's observed: Dysphagia 1 (puree);Nectar-thick liquids Feeding: Needs assist Liquids provided via: Cup Pharyngeal Phase Signs & Symptoms: Suspected delayed swallow initiation;Multiple swallows;Delayed throat clear Type of cueing: Verbal;Tactile Amount of cueing: Moderate   GO    Illona Bulman L. Samson Frederic, [place] CCC/SLP Pager [phone]  Blenda Mounts Laurice 02/03/2013, 12:01 PM

## 2013-02-03 NOTE — Progress Notes (Signed)
Shift event: Pt is a 79 yo WF brought in by her family 02/02/13 for change in mental status. PMHx includes TIA and HTN. CT head was neg for acute issues. RN called earlier requesting Ativan for pt to have MRI secondary to confusion and inability to lie still for test. Given her age, this NP prescribed only .25mg .  About 4 hours after dose, RN called because pt had become less responsive and not following commands. NP to bedside. Pt is VERY HOH and doesn't have hearing aides in. I can arouse her by raising my voice near her ear. She opens her eyes on command. Sticks out her tongue. PERRL. When arms raised, she keeps them in the air. She withdraws legs to stimulation of her feet. MOE x 4. She also says "ehhh", like she is annoyed I am examining her. MRI showed subacute ischemic infarct and a small lacunar infarct also likely subacute. I believe her change to the RN is secondary to the sedative effects of Ativan as this NP could get her to react with verbal and tactile stimulation.  Neuro has seen and 325 aspirin was started earlier on admission/consult.  Will cont to follow. Jimmye Norman, NP [facility]

## 2013-02-03 NOTE — Progress Notes (Signed)
Clinical Social Work Department BRIEF PSYCHOSOCIAL ASSESSMENT 02/03/2013  Patient:  CHARIE, PINKUS     Account Number:  [id number]     Admit date:  02/02/2013  Clinical Social Worker:  Harless Nakayama  Date/Time:  02/03/2013 02:50 PM  Referred by:  Physician  Date Referred:  02/03/2013 Referred for  SNF Placement   Other Referral:   Interview type:  Family Other interview type:   Pt adult children available in room during assessment    PSYCHOSOCIAL DATA Living Status:  WITH ADULT CHILDREN Admitted from facility:   Level of care:   Primary support name:  Tula Nakayama [phone] Primary support relationship to patient:  CHILD, ADULT Degree of support available:   Pt has the support of three adult children.    CURRENT CONCERNS Current Concerns  Post-Acute Placement   Other Concerns:    SOCIAL WORK ASSESSMENT / PLAN CSW informed of PT recommendation for SNF placement. CSW visited pt room. Pt was alseep but pt's three children were in the room. CSW spoke with pt's children about PT recommendation and they confirmed that this was also what they felt would be best at this time. CSW received permission to send referrals to SNFs in [facility]. CSW informed that someone from the family should be available and in the pts room durin daytime hours, but to please contact Tula Nakayama, pt daughter [phone], with bed offers if family is unavailable.   Assessment/plan status:  Psychosocial Support/Ongoing Assessment of Needs Other assessment/ plan:   Information/referral to community resources:   SNF list    PATIENT'S/FAMILY'S RESPONSE TO PLAN OF CARE: Pt sleeping in bed at time of assessment. Pt children available in room to complete assessment.       Minerva Bluett, LCSWA [phone]

## 2013-02-03 NOTE — Progress Notes (Signed)
[facility] PROGRESS NOTE  Holly Allen ZOX:[id number] DOB: 21-May-1915 DOA: 02/02/2013 PCP: Pamelia Hoit, MD  Assessment/Plan: Subacute CVA  -MRI with late subacute ischemic infarct in posterior limb of left internal capsule and small lacunar infarct in the high posterior left parietal lobe -Discussed with neuro-change aspirin to Plavix once patient able to tolerate by mouth. Per neuro no echo carotid Doppler is as would not change therapy in this patient -Await PT OT eval -Neurology following Hyponatremia  -Improving with hydration -TSH within normal limits at 1.69 Gram-negative rod UTI (urinary tract infection)  -Continue empiric antibiotics pending culture ID and sensitivity Acute encephalopathy -Secondary to above, as well as Ativan last pm - troponins so far negative Hypertension  -permissive hypertension, labetalol when necessary, follow and resume by mouth meds when able to tolerate History of COPD  -Stable   Code Status: full Family Communication: Son and daughters at bedside Disposition Plan: Likelywill need SNF   Consultants:  neuro  Procedures:  none  Antibiotics:  Rocephin started   HPI/Subjective: Pt somnolent this a.m., was given Ativan last night prior to MRI due to to restlessness  Objective: Filed Vitals:   02/03/13 0910  BP: 140/57  Pulse: 103  Temp: 97.9 F (36.6 C)  Resp: 18   No intake or output data in the 24 hours ending 02/03/13 1034 Filed Weights   02/02/13 1700  Weight: 45.9 kg (101 lb 3.1 oz)   Exam:  General: Sedated-(s/p Ativan last pm)-opens with the eyes to sternal rub, In no respiratory distress Cardiovascular: RRR, nl S1 s2 Respiratory: CTAB Abdomen: soft +BS NT/ND, no masses palpable Extremities: No cyanosis and no edema     Data Reviewed: Basic Metabolic Panel:  Recent Labs Lab 02/02/13 0900 02/02/13 1550  NA 126*  --   K 3.8  --   CL 88*  --   CO2 27  --   GLUCOSE 177*  --   BUN 14  --    CREATININE 0.72 0.75  CALCIUM 9.5  --    Liver Function Tests:  Recent Labs Lab 02/02/13 0900  AST 14  ALT 10  ALKPHOS 160*  BILITOT 1.1  PROT 6.6  ALBUMIN 3.4*   No results found for this basename: LIPASE, AMYLASE,  in the last 168 hours No results found for this basename: AMMONIA,  in the last 168 hours CBC:  Recent Labs Lab 02/02/13 0900 02/02/13 1550  WBC 9.6 8.6  HGB 14.6 14.1  HCT 41.0 39.5  MCV 82.3 81.4  PLT 216 222   Cardiac Enzymes:  Recent Labs Lab 02/02/13 1550 02/02/13 2218 02/03/13 0405  TROPONINI <0.30 <0.30 <0.30   BNP (last 3 results) No results found for this basename: PROBNP,  in the last 8760 hours CBG: No results found for this basename: GLUCAP,  in the last 168 hours  Recent Results (from the past 240 hour(s))  URINE CULTURE     Status: None   Collection Time    02/02/13  9:00 AM      Result Value Range Status   Specimen Description URINE, CATHETERIZED   Final   Special Requests NONE   Final   Culture  Setup Time     Final   Value: 02/02/2013 14:13     Performed at [facility] Count     Final   Value: >=100,000 COLONIES/ML     Performed at [facility]   Culture     Final   Value:  GRAM NEGATIVE RODS     Performed at [facility]   Report Status PENDING   Incomplete  CULTURE, BLOOD (ROUTINE X 2)     Status: None   Collection Time    02/02/13  9:15 AM      Result Value Range Status   Specimen Description BLOOD HAND LEFT   Final   Special Requests BOTTLES DRAWN AEROBIC ONLY 6CC   Final   Culture  Setup Time     Final   Value: 02/02/2013 14:06     Performed at [facility]   Culture     Final   Value:        BLOOD CULTURE RECEIVED NO GROWTH TO DATE CULTURE WILL BE HELD FOR 5 DAYS BEFORE ISSUING A FINAL NEGATIVE REPORT     Performed at [facility]   Report Status PENDING   Incomplete     Studies: Ct Head Wo Contrast  02/02/2013   *RADIOLOGY REPORT*  Clinical Data: Slurred  speech and altered mental status  CT HEAD WITHOUT CONTRAST  Technique:  Contiguous axial images were obtained from the base of the skull through the vertex without contrast. Study was obtained within 24 hours of patient arrival at the emergency department.  Comparison: Pain CT March 22, 2012 and brain MRI March 23, 2012  Findings:  There is mild diffuse atrophy.  There is no mass, hemorrhage, extra-axial fluid collection, or midline shift.  There is small vessel disease throughout the centra semiovale bilaterally.  There is some more recent appearing small vessel disease in the mid superior right centrum semiovale.  A recent infarct in this area cannot be excluded on this study.  There is no focal vascular distribution type infarct on this study.  Bony calvarium appears intact.  The mastoid air cells are clear. There are cataracts present bilaterally.  IMPRESSION: Atrophy with small vessel disease.  Question recent infarct in the periphery of the white matter of the mid superior right centrum semiovale.  No hemorrhage or mass effect.   Original Report Authenticated By: Bretta Bang, M.D.   Mr Brain Wo Contrast  02/02/2013   *RADIOLOGY REPORT*  Clinical Data: Stroke  MRI HEAD WITHOUT CONTRAST  Technique:  Multiplanar, multiecho pulse sequences of the brain and surrounding structures were obtained according to standard protocol without intravenous contrast.  Comparison: CT performed earlier on the same day as well as prior MRI from 03/23/2012.  Findings: Diffuse prominence of the CSF containing spaces was consistent with generalized atrophy.  Scattered and confluent hyperintense foci of T2 / FLAIR signal within the periventricular deep white matter is consistent with chronic small vessel ischemic changes.  Overall, the extent of this is slightly progressed as compared to the prior study.  Remote lacunar infarct is present within the left centrum semi ovale.  Additional tiny focus of restricted diffusion  is present within the high left parietal lobe (series 4, image 22).  Subtle foci of restricted diffusion are seen involving the posterior limb of the internal capsule on the left. There is a small amount of associated FLAIR signal with slightly decreased signal intensity on the ADC map, suggestive of a late subacute ischemic infarct.  Subtle focus of restricted diffusion within the right internal capsule is not densely demonstrate corresponding hypointense ADC signal or flow abnormality, and likely represents T2 shine through. No other acute intracranial hemorrhage or infarct identified.  There is no midline shift or mass lesion.  Orbital soft tissues are normal.  No extra-axial fluid collection.  Paranasal sinuses are clear.  IMPRESSION: 1.  Late subacute ischemic infarct involving the posterior limb of the left internal capsule as detailed above. 2.  Small lacunar infarct within the high posterior left parietal lobe, also likely subacute 3.  Atrophy with chronic small vessel ischemic changes, slightly progressed as compared to 03/23/2012   Original Report Authenticated By: Rise Mu, M.D.   Dg Chest Port 1 View  02/02/2013   *RADIOLOGY REPORT*  Clinical Data: Weakness, inability to communicate  PORTABLE CHEST - 1 VIEW  Comparison: Chest x-ray of 01/18/2013  Findings: The lungs remain somewhat hyperaerated.  No infiltrate or effusion is seen.  Mild cardiomegaly is stable.  Prominent pulmonary artery segments remain most consistent with a degree of pulmonary arterial hypertension.  The bones are osteopenic. Lower thoracic vertebroplasties are noted.  IMPRESSION: Hyperaeration consistent with emphysema.  No active lung disease. Stable mild cardiomegaly.   Original Report Authenticated By: Dwyane Dee, M.D.    Scheduled Meds: . aspirin  300 mg Rectal Daily   Or  . aspirin  325 mg Oral Daily  . cefTRIAXone (ROCEPHIN)  IV  1 g Intravenous Q24H  . heparin  5,000 Units Subcutaneous Q8H  . meloxicam  15  mg Oral Daily  . simvastatin  10 mg Oral q1800   Continuous Infusions: . sodium chloride 75 mL/hr (02/03/13 0527)    Active Problems:   Hypertension   Acute encephalopathy   UTI (urinary tract infection)   Hyponatremia    Time spent: 35    Edrei Norgaard C  [facility] Pager [phone]. If 7PM-7AM, please contact night-coverage at [email or web address], password [facility] 02/03/2013, 10:34 AM  LOS: 1 day

## 2013-02-04 DIAGNOSIS — E871 Hypo-osmolality and hyponatremia: Secondary | ICD-10-CM

## 2013-02-04 LAB — BASIC METABOLIC PANEL
Chloride: 103 mEq/L (ref 96–112)
Creatinine, Ser: 0.67 mg/dL (ref 0.50–1.10)
GFR calc Af Amer: 84 mL/min — ABNORMAL LOW (ref >90)
Sodium: 136 mEq/L (ref 135–145)

## 2013-02-04 MED ORDER — STROKE: EARLY STAGES OF RECOVERY BOOK
Freq: Once | Status: AC
  Administered 2013-02-04: 20:00:00
  Filled 2013-02-04: qty 1

## 2013-02-04 MED ORDER — METOPROLOL TARTRATE 50 MG PO TABS
50.0000 mg | ORAL_TABLET | Freq: Two times a day (BID) | ORAL | Status: DC
  Administered 2013-02-04 – 2013-02-07 (×6): 50 mg via ORAL
  Filled 2013-02-04 (×7): qty 1

## 2013-02-04 NOTE — Progress Notes (Signed)
Clinical Social Worker (CSW) informed pt daughter [name] of bed offer made from [facility]. Daughter very appreciative and agreeable to placement. CSW contacted [facility] and informed them of potential dc for Monday. CSW to remaining following.  Theresia Bough, MSW, Theresia Majors [phone]

## 2013-02-04 NOTE — Progress Notes (Signed)
[facility] PROGRESS NOTE  Holly Allen:[id number] DOB: 02/17/1915 DOA: 02/02/2013 PCP: Pamelia Hoit, MD  Assessment/Plan: Subacute CVA  -MRI with late subacute ischemic infarct in posterior limb of left internal capsule and small lacunar infarct in the high posterior left parietal lobe -continue Plavix. Per neuro no echo carotid Doppler is as would not change therapy in this patient -PT OT recommending SNF -Neurology following Hyponatremia  -resolved with hydration -TSH within normal limits at 1.69 Kleb UTI (urinary tract infection)  -Rocephin-it is sensitive Acute encephalopathy -Secondary to above, as well as Ativan initially. - troponins so far negative -clinically improved Hypertension  -resume metoprolol, taking by mouth meds when able to tolerate History of COPD  -Stable   Code Status: full Family Communication: Son and daughters at bedside Disposition Plan: Likely will need SNF   Consultants:  neuro  Procedures:  none  Antibiotics:  Rocephin started   HPI/Subjective: More alert today and tolerating POs  Objective: Filed Vitals:   02/04/13 1629  BP: 141/68  Pulse: 98  Temp: 98.1 F (36.7 C)  Resp: 17    Intake/Output Summary (Last 24 hours) at 02/04/13 1743 Last data filed at 02/04/13 1300  Gross per 24 hour  Intake   1350 ml  Output      0 ml  Net   1350 ml   Filed Weights   02/02/13 1700  Weight: 45.9 kg (101 lb 3.1 oz)   Exam:  General: alert and appropriate in NAD Cardiovascular: RRR, nl S1 s2 Respiratory: CTAB Abdomen: soft +BS NT/ND, no masses palpable Extremities: No cyanosis and no edema     Data Reviewed: Basic Metabolic Panel:  Recent Labs Lab 02/02/13 0900 02/02/13 1550 02/03/13 0838 02/04/13 0710  NA 126*  --  131* 136  K 3.8  --  3.6 3.7  CL 88*  --  96 103  CO2 27  --  24 25  GLUCOSE 177*  --  112* 109*  BUN 14  --  17 15  CREATININE 0.72 0.75 0.74 0.67  CALCIUM 9.5  --  9.0 9.1   Liver  Function Tests:  Recent Labs Lab 02/02/13 0900  AST 14  ALT 10  ALKPHOS 160*  BILITOT 1.1  PROT 6.6  ALBUMIN 3.4*   No results found for this basename: LIPASE, AMYLASE,  in the last 168 hours No results found for this basename: AMMONIA,  in the last 168 hours CBC:  Recent Labs Lab 02/02/13 0900 02/02/13 1550  WBC 9.6 8.6  HGB 14.6 14.1  HCT 41.0 39.5  MCV 82.3 81.4  PLT 216 222   Cardiac Enzymes:  Recent Labs Lab 02/02/13 1550 02/02/13 2218 02/03/13 0405  TROPONINI <0.30 <0.30 <0.30   BNP (last 3 results) No results found for this basename: PROBNP,  in the last 8760 hours CBG: No results found for this basename: GLUCAP,  in the last 168 hours  Recent Results (from the past 240 hour(s))  CULTURE, BLOOD (ROUTINE X 2)     Status: None   Collection Time    02/02/13  9:00 AM      Result Value Range Status   Specimen Description BLOOD LEFT ANTECUBITAL   Final   Special Requests BOTTLES DRAWN AEROBIC ONLY [facility]   Final   Culture  Setup Time     Final   Value: 02/03/2013 08:06     Performed at [facility]   Culture     Final   Value:  BLOOD CULTURE RECEIVED NO GROWTH TO DATE CULTURE WILL BE HELD FOR 5 DAYS BEFORE ISSUING A FINAL NEGATIVE REPORT     Performed at [facility]   Report Status PENDING   Incomplete  URINE CULTURE     Status: None   Collection Time    02/02/13  9:00 AM      Result Value Range Status   Specimen Description URINE, CATHETERIZED   Final   Special Requests NONE   Final   Culture  Setup Time     Final   Value: 02/02/2013 14:13     Performed at [facility] Count     Final   Value: >=100,000 COLONIES/ML     Performed at [facility]   Culture     Final   Value: KLEBSIELLA PNEUMONIAE     Performed at [facility]   Report Status 02/03/2013 FINAL   Final   Organism ID, Bacteria KLEBSIELLA PNEUMONIAE   Final  CULTURE, BLOOD (ROUTINE X 2)     Status: None   Collection Time     02/02/13  9:15 AM      Result Value Range Status   Specimen Description BLOOD HAND LEFT   Final   Special Requests BOTTLES DRAWN AEROBIC ONLY [facility]   Final   Culture  Setup Time     Final   Value: 02/02/2013 14:06     Performed at [facility]   Culture     Final   Value:        BLOOD CULTURE RECEIVED NO GROWTH TO DATE CULTURE WILL BE HELD FOR 5 DAYS BEFORE ISSUING A FINAL NEGATIVE REPORT     Performed at [facility]   Report Status PENDING   Incomplete     Studies: Mr Brain Wo Contrast  02/02/2013   *RADIOLOGY REPORT*  Clinical Data: Stroke  MRI HEAD WITHOUT CONTRAST  Technique:  Multiplanar, multiecho pulse sequences of the brain and surrounding structures were obtained according to standard protocol without intravenous contrast.  Comparison: CT performed earlier on the same day as well as prior MRI from 03/23/2012.  Findings: Diffuse prominence of the CSF containing spaces was consistent with generalized atrophy.  Scattered and confluent hyperintense foci of T2 / FLAIR signal within the periventricular deep white matter is consistent with chronic small vessel ischemic changes.  Overall, the extent of this is slightly progressed as compared to the prior study.  Remote lacunar infarct is present within the left centrum semi ovale.  Additional tiny focus of restricted diffusion is present within the high left parietal lobe (series 4, image 22).  Subtle foci of restricted diffusion are seen involving the posterior limb of the internal capsule on the left. There is a small amount of associated FLAIR signal with slightly decreased signal intensity on the ADC map, suggestive of a late subacute ischemic infarct.  Subtle focus of restricted diffusion within the right internal capsule is not densely demonstrate corresponding hypointense ADC signal or flow abnormality, and likely represents T2 shine through. No other acute intracranial hemorrhage or infarct identified.  There is no midline shift  or mass lesion.  Orbital soft tissues are normal.  No extra-axial fluid collection.  Paranasal sinuses are clear.  IMPRESSION: 1.  Late subacute ischemic infarct involving the posterior limb of the left internal capsule as detailed above. 2.  Small lacunar infarct within the high posterior left parietal lobe, also likely subacute 3.  Atrophy with chronic  small vessel ischemic changes, slightly progressed as compared to 03/23/2012   Original Report Authenticated By: Rise Mu, M.D.    Scheduled Meds: . clopidogrel  75 mg Oral Q breakfast  . feeding supplement  1 Container Oral TID BM  . heparin  5,000 Units Subcutaneous Q8H  . meloxicam  15 mg Oral Daily  . metoprolol tartrate  50 mg Oral BID  . simvastatin  10 mg Oral q1800   Continuous Infusions: . sodium chloride 75 mL/hr at 02/03/13 1926    Active Problems:   Hypertension   Acute encephalopathy   UTI (urinary tract infection)   Hyponatremia    Time spent: 25    [facility]  [facility] Pager [phone]. If 7PM-7AM, please contact night-coverage at [email or web address], password [facility] 02/04/2013, 5:43 PM  LOS: 2 days

## 2013-02-04 NOTE — Progress Notes (Signed)
Stroke Team Progress Note  HISTORY Holly Allen is an 79 y.o. female who was last normal yesterday 02/01/2013. Per son who is at bedside, patient fell on Sunday but had no residual SE from fall. She was normal on Monday but Tuesday she seemed more fatigued than usual. At 5PM on Tuesday family noted she seemed less alert and had some possible left arm weakness. Patient went to sleep and awoke still not herself this AM. Son placed her on the commode and noted she did not get up for some time. When he returned to see how she was, he noticed she was not responding to him, following no commands and nonverbal. She was brought to [place] for further evaluation. Initial CMP showed sodium 126, and many bacteria in Urine--culture pending. On consultation patient did not blink to threat on the right but otherwise was non-focal. She would not follow commands either verbal or visual. CT head showed question recent infarct in the periphery of the white matter of the mid superior right centrum semiovale. Patient was not a TPA candidate as stroke not considered on admission.  She was admitted for further evaluation and treatment.  SUBJECTIVE No family at bedside.  OBJECTIVE Most recent Vital Signs: Filed Vitals:   02/04/13 0000 02/04/13 0400 02/04/13 0500 02/04/13 0902  BP: 177/74 184/86 170/82 193/84  Pulse: 95 92  93  Temp:  98.4 F (36.9 C)  98.2 F (36.8 C)  TempSrc:  Oral  Oral  Resp:    18  Height:      Weight:      SpO2: 98% 98%  98%   CBG (last 3)  No results found for this basename: GLUCAP,  in the last 72 hours  IV Fluid Intake:   . sodium chloride 75 mL/hr at 02/03/13 1926    MEDICATIONS  . clopidogrel  75 mg Oral Q breakfast  . feeding supplement  1 Container Oral TID BM  . heparin  5,000 Units Subcutaneous Q8H  . meloxicam  15 mg Oral Daily  . simvastatin  10 mg Oral q1800   PRN:  labetalol, RESOURCE THICKENUP CLEAR, senna-docusate, traMADol  Diet:  Dysphagia 1 nectar thick  liquids Activity:  OOB  with assistance DVT Prophylaxis:  Lovenox 40 mg sq daily   CLINICALLY SIGNIFICANT STUDIES Basic Metabolic Panel:   Recent Labs Lab 02/03/13 0838 02/04/13 0710  NA 131* 136  K 3.6 3.7  CL 96 103  CO2 24 25  GLUCOSE 112* 109*  BUN 17 15  CREATININE 0.74 0.67  CALCIUM 9.0 9.1   Liver Function Tests:   Recent Labs Lab 02/02/13 0900  AST 14  ALT 10  ALKPHOS 160*  BILITOT 1.1  PROT 6.6  ALBUMIN 3.4*   CBC:   Recent Labs Lab 02/02/13 0900 02/02/13 1550  WBC 9.6 8.6  HGB 14.6 14.1  HCT 41.0 39.5  MCV 82.3 81.4  PLT 216 222   Coagulation: No results found for this basename: LABPROT, INR,  in the last 168 hours Cardiac Enzymes:   Recent Labs Lab 02/02/13 1550 02/02/13 2218 02/03/13 0405  TROPONINI <0.30 <0.30 <0.30   Urinalysis:   Recent Labs Lab 02/02/13 0900  COLORURINE YELLOW  LABSPEC 1.010  PHURINE 7.0  GLUCOSEU 100*  HGBUR SMALL*  BILIRUBINUR NEGATIVE  KETONESUR 15*  PROTEINUR 100*  UROBILINOGEN 0.2  NITRITE NEGATIVE  LEUKOCYTESUR TRACE*   Lipid Panel    Component Value Date/Time   CHOL 140 02/03/2013 0405   TRIG 142  02/03/2013 0405   HDL 49 02/03/2013 0405   CHOLHDL 2.9 02/03/2013 0405   VLDL 28 02/03/2013 0405   LDLCALC 63 02/03/2013 0405   HgbA1C  Lab Results  Component Value Date   HGBA1C 5.9* 02/03/2013    Urine Drug Screen:   No results found for this basename: labopia,  cocainscrnur,  labbenz,  amphetmu,  thcu,  labbarb    Alcohol Level: No results found for this basename: ETH,  in the last 168 hours  CT of the brain  02/02/2013   Atrophy with small vessel disease.  Question recent infarct in the periphery of the white matter of the mid superior right centrum semiovale.  No hemorrhage or mass effect.     MRI of the brain  02/02/2013   : 1.  Late subacute ischemic infarct involving the posterior limb of the left internal capsule as detailed above. 2.  Small lacunar infarct within the high posterior left parietal  lobe, also likely subacute 3.  Atrophy with chronic small vessel ischemic changes, slightly progressed as compared to 03/23/2012     CXR  02/02/2013  Hyperaeration consistent with emphysema.  No active lung disease. Stable mild cardiomegaly.   EKG  normal sinus rhythm  Therapy Recommendations SNF  Physical Exam   Frail elderly Caucasian lady not in distress . Afebrile. Head is nontraumatic. Neck is supple without bruit. . Cardiac exam no murmur or gallop. Lungs are clear to auscultation. Distal pulses are well felt. Neurological Exam ;Awake alert. Speaks a few words and wants to be left alone. Can follow gaze in a direction. Pupils irregular sluggishly reactive. Face appears symmetric. Tongue is midline. Motor system exam does not move extremities voluntarily but can withdraw all 4 limbs well to painful stimuli. No focal weakness noted. Deep tendon reflexes are 2+ symmetric. Both plantar downgoing. Sensation, coordination and gait cannot be reliably tested.  ASSESSMENT Ms. Holly Allen is a 79 y.o. female presenting with increased weakness, worsening mental status and possible left arm weakness.  Imaging confirms a left PLIC and tiny left high parietal white matter infarct. Infarcts felt to be thrombotic secondary to small vessel disease.  On aspirin 81 mg bid prior to admission. Now on clopidogrel 75 mg orally every day for secondary stroke prevention. Patient with resultant lethargy, left hemiparesis. Work up completed.  Hypertension Hx TIA UTI on rocephin  Hospital day # 2  TREATMENT/PLAN  Continue clopidogrel 75 mg orally every day for secondary stroke prevention.  SNF placement  No further stroke workup indicated. Patient has a 10-15% risk of having another stroke over the next year, the highest risk is within 2 weeks of the most recent stroke/TIA (risk of having a stroke following a stroke or TIA is the same). Ongoing risk factor control by Primary Care Physician Stroke Service  will sign off. Please call should any needs arise. Follow up with Dr. Pearlean Brownie as needed  Annie Main, MSN, RN, ANVP-BC, ANP-BC, GNP-BC [facility] Pager: [phone] 02/04/2013 10:06 AM  I have personally obtained a history, examined the patient, evaluated imaging results, and formulated the assessment and plan of care. I agree with the above.  Delia Heady, MD

## 2013-02-04 NOTE — Evaluation (Signed)
Occupational Therapy Evaluation Patient Details Name: Holly Allen MRN: [id number] DOB: 1914/11/08 Today's Date: 02/04/2013 Time: 9562-1308 OT Time Calculation (min): 25 min  OT Assessment / Plan / Recommendation History of present illness Patient is a 79 y/o female admitted with weakness, confusion MRI positive for subacute infarcts left high parietal and left internal capsule.     Clinical Impression   Pt admitted with above. Will continue to follow pt acutely in order to address below problem list.  Recommending SNF for d/c planning. Pt's daughters present during session and report they are agreeable with SNF.    OT Assessment  Patient needs continued OT Services    Follow Up Recommendations  SNF    Barriers to Discharge Decreased caregiver support    Equipment Recommendations  3 in 1 bedside comode    Recommendations for Other Services    Frequency  Min 2X/week    Precautions / Restrictions Precautions Precautions: Fall Precaution Comments: very hard of hearing   Pertinent Vitals/Pain HR 140s at rest, 140s-170s with activity.  RN present and aware.      ADL  Upper Body Bathing: Minimal assistance;Performed;Chest;Right arm;Left arm;Abdomen Where Assessed - Upper Body Bathing: Supine, head of bed up Lower Body Bathing: Performed;Moderate assistance Where Assessed - Lower Body Bathing: Supine, head of bed up;Lean right and/or left Upper Body Dressing: Performed;Moderate assistance Where Assessed - Upper Body Dressing: Supine, head of bed up Lower Body Dressing: Performed;+1 Total assistance Where Assessed - Lower Body Dressing: Supine, head of bed up Toilet Transfer: Performed;Minimal assistance Toilet Transfer Method: Stand pivot Toilet Transfer Equipment: Bedside commode Toileting - Clothing Manipulation and Hygiene: Performed;Minimal assistance Where Assessed - Engineer, mining and Hygiene: Sit to stand from 3-in-1 or toilet Equipment Used: Gait  belt;Rolling walker Transfers/Ambulation Related to ADLs: Min assist for SPT bed<>3n1<>chair with RW. ADL Comments: Pt with urinary incontinence in bed upon PT/OT arrival.  Assisted pt with donning/doffing clean gown while in bed.  Pt reporting she needed to use bathroom again.  Pt's HR in 140s so brought 3n1 next to bed rather than ambulating to bathroom.  During SPT from bed<>3n1, pt incontinent of urine once again.  Min assist for toileting hygiene tasks.    OT Diagnosis: Generalized weakness;Cognitive deficits  OT Problem List: Decreased strength;Decreased activity tolerance;Impaired balance (sitting and/or standing);Decreased cognition;Decreased knowledge of use of DME or AE OT Treatment Interventions: Self-care/ADL training;DME and/or AE instruction;Therapeutic activities;Patient/family education;Balance training;Cognitive remediation/compensation   OT Goals(Current goals can be found in the care plan section) Acute Rehab OT Goals Patient Stated Goal: To return to independent OT Goal Formulation: With patient/family Time For Goal Achievement: 02/18/13 Potential to Achieve Goals: Good  Visit Information  Last OT Received On: 02/04/13 Assistance Needed: +2 (for safety with ambulation) History of Present Illness: Patient is a 79 y/o female admitted with weakness, confusion MRI positive for subacute infarcts left high parietal and left internal capsule.         Prior Functioning     Home Living Family/patient expects to be discharged to:: Skilled nursing facility Available Help at Discharge: Family Type of Home: House  Lives With: Son Prior Function Level of Independence: Independent with assistive device(s) Comments: no longer cooking Communication Communication: HOH Dominant Hand: Right         Vision/Perception     Cognition  Cognition Arousal/Alertness: Awake/alert Behavior During Therapy: WFL for tasks assessed/performed Overall Cognitive Status:  Impaired/Different from baseline Area of Impairment: Orientation;Attention Orientation Level: Disoriented to;Time;Situation Current Attention Level:  Focused Following Commands: Follows one step commands inconsistently;Follows one step commands with increased time    Extremity/Trunk Assessment Upper Extremity Assessment Upper Extremity Assessment: Generalized weakness;RUE deficits/detail;LUE deficits/detail;Difficult to assess due to impaired cognition RUE Deficits / Details: AROM WFL LUE Deficits / Details: AROM WFL.     Mobility Bed Mobility Bed Mobility: Supine to Sit;Sitting - Scoot to Edge of Bed Supine to Sit: 4: Min assist;With rails;HOB elevated Sitting - Scoot to Edge of Bed: 3: Mod assist Details for Bed Mobility Assistance: Assist to support trunk OOB and for scooting forward to EOB. Transfers Transfers: Sit to Stand;Stand to Sit Sit to Stand: 4: Min assist;From bed;From chair/3-in-1;With upper extremity assist Stand to Sit: 3: Mod assist;To chair/3-in-1;With armrests;With upper extremity assist Details for Transfer Assistance: Assist for steadying and balance.     Exercise     Balance     End of Session OT - End of Session Equipment Utilized During Treatment: Gait belt;Rolling walker Activity Tolerance: Other (comment) (limited by increased HR) Patient left: in chair;with call bell/phone within reach;with nursing/sitter in room;with family/visitor present Nurse Communication: Mobility status (increased HR)  GO    02/04/2013 Cipriano Mile OTR/L Pager [phone] Office [phone]   Cipriano Mile 02/04/2013, 1:46 PM

## 2013-02-04 NOTE — Progress Notes (Signed)
Physical Therapy Treatment Patient Details Name: Holly Allen MRN: [id number] DOB: 1914/08/02 Today's Date: 02/04/2013 Time: 4098-1191 PT Time Calculation (min): 25 min  PT Assessment / Plan / Recommendation  History of Present Illness Patient is a 79 y/o female admitted with weakness, confusion MRI positive for subacute infarcts left high parietal and left internal capsule.     PT Comments   Patient with improved affect and cognition today.  Limited mobility due to HR into 170s with standing activity 150's seated.  Will need SNF level rehab to maximize independence prior to d/c home.  Follow Up Recommendations  SNF     Does the patient have the potential to tolerate intense rehabilitation   No  Barriers to Discharge  None      Equipment Recommendations  None recommended by PT    Recommendations for Other Services  None  Frequency Min 3X/week   Progress towards PT Goals Progress towards PT goals: Progressing toward goals  Plan Current plan remains appropriate    Precautions / Restrictions Precautions Precautions: Fall Precaution Comments: very hard of hearing   Pertinent Vitals/Pain Denies pain    Mobility  Bed Mobility Bed Mobility: Supine to Sit;Sitting - Scoot to Edge of Bed Supine to Sit: 4: Min assist;With rails;HOB elevated Sitting - Scoot to Edge of Bed: 3: Mod assist Details for Bed Mobility Assistance: Assist to support trunk OOB and for scooting forward to EOB. Transfers Sit to Stand: 4: Min assist;From bed;From chair/3-in-1;With upper extremity assist Stand to Sit: 3: Mod assist;To chair/3-in-1;With armrests;With upper extremity assist Stand Pivot Transfers: 3: Mod assist Details for Transfer Assistance: for safety to toilet due to in a hurry with urinary incontinence      PT Goals (current goals can now be found in the care plan section) Acute Rehab PT Goals Patient Stated Goal: To return to independent  Visit Information  Last PT Received On:  02/04/13 Assistance Needed: +2 (for safety with ambulation) PT/OT Co-Evaluation/Treatment: Yes History of Present Illness: Patient is a 79 y/o female admitted with weakness, confusion MRI positive for subacute infarcts left high parietal and left internal capsule.      Subjective Data  Patient Stated Goal: To return to independent   Cognition  Cognition Arousal/Alertness: Awake/alert Behavior During Therapy: WFL for tasks assessed/performed Overall Cognitive Status: Impaired/Different from baseline Area of Impairment: Orientation;Attention Orientation Level: Disoriented to;Time;Situation Current Attention Level: Sustained Following Commands: Follows one step commands with increased time    Insurance risk surveyor Sitting - Balance Support: Feet supported;Right upper extremity supported Static Sitting - Level of Assistance: 5: Stand by assistance  End of Session PT - End of Session Equipment Utilized During Treatment: Gait belt Activity Tolerance: Treatment limited secondary to medical complications (Comment) (HR into 170's with standing activity) Patient left: in chair;with family/visitor present;with nursing/sitter in room Nurse Communication: Other (comment) (incr HR/BP RN delivered meds)   GP     [facility] 02/04/2013, 4:57 PM Sheran Lawless, PT [phone] 02/04/2013

## 2013-02-04 NOTE — Evaluation (Signed)
Speech Language Pathology Evaluation Patient Details Name: Holly Allen MRN: [id number] DOB: 1915/03/15 Today's Date: 02/04/2013 Time: 4098-1191 SLP Time Calculation (min): 10 min  Problem List:  Patient Active Problem List   Diagnosis Date Noted  . Acute encephalopathy 02/02/2013  . UTI (urinary tract infection) 02/02/2013  . Hyponatremia 02/02/2013  . Mild cognitive impairment, so stated 08/17/2012  . Unspecified cerebral artery occlusion with cerebral infarction 08/17/2012  . Acute right MCA stroke 03/23/2012  . Stroke 03/22/2012  . Iron deficiency anemia 03/21/2012  . Pneumonia 03/20/2012  . Anemia 03/19/2012  . COPD (chronic obstructive pulmonary disease) 03/19/2012  . Fracture of femoral neck, right 03/17/2012  . HTN (hypertension) 03/17/2012  . Hx of stroke without residual deficits 03/17/2012  . Leukocytosis 03/17/2012  . Hypertension    Past Medical History:  Past Medical History  Diagnosis Date  . Hypertension   . TIA (transient ischemic attack)    Past Surgical History:  Past Surgical History  Procedure Laterality Date  . Hip arthroplasty  03/17/2012    Procedure: ARTHROPLASTY BIPOLAR HIP;  Surgeon: Kathryne Hitch, MD;  Location: [facility] OR;  Service: Orthopedics;  Laterality: Right;  . Ankle fracture surgery     HPI:  79 y.o. female presenting with increased weakness, worsening mental status and possible left arm weakness.  Imaging confirms a left PLIC and tiny left high parietal white matter infarct. Infarcts felt to be thrombotic secondary to small vessel disease.     Assessment / Plan / Recommendation Clinical Impression  Pt presents with decreased MS s/p CVA.  Had baseline cognitive deficits that are now exacerbated, impacting attention, recall, and judgement.  Pt may benefit from trial intervention for safety/cognition in next venue of care    SLP Assessment  All further Speech Lanaguage Pathology  needs can be addressed in the next venue of care ;  addressing dysphagia currently and will also need f/u at SNF for swallowing.      SLP Evaluation Prior Functioning  Cognitive/Linguistic Baseline: Baseline deficits Type of Home: House  Lives With: Son Available Help at Discharge: Family   Cognition  Overall Cognitive Status: Impaired/Different from baseline Arousal/Alertness: Awake/alert Orientation Level: Oriented to person Attention: Focused Focused Attention: Appears intact Memory: Impaired Memory Impairment: Storage deficit;Decreased recall of new information Awareness: Impaired Awareness Impairment: Intellectual impairment Problem Solving: Impaired Problem Solving Impairment: Verbal basic;Functional basic    Comprehension  Auditory Comprehension Overall Auditory Comprehension: Appears within functional limits for tasks assessed Visual Recognition/Discrimination Discrimination: Not tested Reading Comprehension Reading Status: Not tested    Expression Expression Primary Mode of Expression: Verbal Verbal Expression Overall Verbal Expression: Appears within functional limits for tasks assessed   Oral / Motor Oral Motor/Sensory Function Overall Oral Motor/Sensory Function: Appears within functional limits for tasks assessed Motor Speech Overall Motor Speech: Appears within functional limits for tasks assessed   GO    Josph Norfleet L. Samson Frederic, [place] CCC/SLP Pager [phone]  Blenda Mounts Laurice 02/04/2013, 1:17 PM

## 2013-02-04 NOTE — Progress Notes (Signed)
Speech Language Pathology Dysphagia Treatment Patient Details Name: ABBIE BERLING MRN: [id number] DOB: 08-16-1914 Today's Date: 02/04/2013 Time: 1010-1025 SLP Time Calculation (min): 15 min  Assessment / Plan / Recommendation Clinical Impression  Assisted pt with breakfast.  Better initiative to self-feed, with good toleration of purees and nectar-thick liquids.  Thin liquid trials continue to elicit consistent signs of possible aspiration, with throat-clearing and coughing occurring after thin boluses.  Min overall cues for self-feeding, pharyngeal protection. Recommend continued dysphagia 1 diet with nectar-thick liquids.  SLP f/u at SNF to address diet upgrade and dysphagia management.    Diet Recommendation  Continue with Current Diet: Dysphagia 1 (puree);Nectar-thick liquid    SLP Plan Continue with current plan of care   Pertinent Vitals/Pain No c/o pain   Swallowing Goals  SLP Swallowing Goals Patient will utilize recommended strategies during swallow to increase swallowing safety with: Supervision/safety Swallow Study Goal #2 - Progress: Progressing toward goal  General Temperature Spikes Noted: No Respiratory Status: Room air Behavior/Cognition: Alert;Confused Oral Cavity - Dentition: Missing dentition Patient Positioning: Upright in bed  Oral Cavity - Oral Hygiene Does patient have any of the following "at risk" factors?: Other - dysphagia   Dysphagia Treatment Treatment focused on: Skilled observation of diet tolerance;Upgraded PO texture trials Treatment Methods/Modalities: Skilled observation;Differential diagnosis Patient observed directly with PO's: Yes Type of PO's observed: Dysphagia 1 (puree);Nectar-thick liquids;Thin liquids Feeding: Able to feed self;Needs assist Liquids provided via: Cup Pharyngeal Phase Signs & Symptoms: Suspected delayed swallow initiation;Multiple swallows;Delayed throat clear Type of cueing: Verbal;Tactile Amount of cueing: Minimal    GO     Carolan Shiver 02/04/2013, 11:57 AM

## 2013-02-04 NOTE — Progress Notes (Signed)
[facility]  Patient Status: Active (receiving services up to time of hospitalization)  [facility] is providing the following services: RN, PT, OT, MSW and HHA  If patient discharges after hours, please call [phone].   Holly Allen 02/04/2013, 4:33 PM

## 2013-02-05 MED ORDER — AMLODIPINE BESYLATE 5 MG PO TABS
5.0000 mg | ORAL_TABLET | Freq: Every day | ORAL | Status: DC
  Administered 2013-02-05 – 2013-02-07 (×3): 5 mg via ORAL
  Filled 2013-02-05 (×3): qty 1

## 2013-02-05 MED ORDER — LEVALBUTEROL HCL 0.63 MG/3ML IN NEBU
0.6300 mg | INHALATION_SOLUTION | Freq: Three times a day (TID) | RESPIRATORY_TRACT | Status: AC
  Administered 2013-02-06 (×2): 0.63 mg via RESPIRATORY_TRACT
  Filled 2013-02-05 (×3): qty 3

## 2013-02-05 MED ORDER — CEPHALEXIN 500 MG PO CAPS
500.0000 mg | ORAL_CAPSULE | Freq: Two times a day (BID) | ORAL | Status: DC
  Administered 2013-02-05 – 2013-02-07 (×6): 500 mg via ORAL
  Filled 2013-02-05 (×7): qty 1

## 2013-02-05 NOTE — Progress Notes (Addendum)
[facility] PROGRESS NOTE  Holly Allen [id number]:[id number] DOB: [birth year] DOA: 02/02/2013 PCP: Pamelia Hoit, MD  Assessment/Plan: Subacute CVA  -MRI with late subacute ischemic infarct in posterior limb of left internal capsule and small lacunar infarct in the high posterior left parietal lobe -continue Plavix. Per neuro no echo carotid Doppler is as would not change therapy in this patient -PT OT recommending SNF -Neurology following Hyponatremia  -resolved with hydration -TSH within normal limits at 1.69 Kleb UTI (urinary tract infection)  -was changed to-it is sensitive Acute encephalopathy -Secondary to above, as well as Ativan initially. - troponins so far negative -clinically improved Hypertension  -resume metoprolol, taking by mouth meds when able to tolerate -add norvasc for better BP control COPD  - Add  nebs   Code Status: full Family Communication: Son and daughters at bedside Disposition Plan: Likely will need SNF   Consultants:  neuro  Procedures:  none  Antibiotics:  Rocephin started   HPI/Subjective: Appears dyspneic but denies SOB  Objective: Filed Vitals:   02/05/13 1300  BP: 156/79  Pulse: 93  Temp: 97.9 F (36.6 C)  Resp: 16    Intake/Output Summary (Last 24 hours) at 02/05/13 1623 Last data filed at 02/05/13 1300  Gross per 24 hour  Intake    700 ml  Output      0 ml  Net    700 ml   Filed Weights   02/02/13 1700  Weight: 45.9 kg (101 lb 3.1 oz)   Exam:  General: alert and appropriate in NAD Cardiovascular: RRR, nl S1 s2 Respiratory: few wheezes, dcreased air entry Abdomen: soft +BS NT/ND, no masses palpable Extremities: No cyanosis and no edema     Data Reviewed: Basic Metabolic Panel:  Recent Labs Lab 02/02/13 0900 02/02/13 1550 02/03/13 0838 02/04/13 0710  NA 126*  --  131* 136  K 3.8  --  3.6 3.7  CL 88*  --  96 103  CO2 27  --  24 25  GLUCOSE 177*  --  112* 109*  BUN 14  --  17 15  CREATININE  0.72 0.75 0.74 0.67  CALCIUM 9.5  --  9.0 9.1   Liver Function Tests:  Recent Labs Lab 02/02/13 0900  AST 14  ALT 10  ALKPHOS 160*  BILITOT 1.1  PROT 6.6  ALBUMIN 3.4*   No results found for this basename: LIPASE, AMYLASE,  in the last 168 hours No results found for this basename: AMMONIA,  in the last 168 hours CBC:  Recent Labs Lab 02/02/13 0900 02/02/13 1550  WBC 9.6 8.6  HGB 14.6 14.1  HCT 41.0 39.5  MCV 82.3 81.4  PLT 216 222   Cardiac Enzymes:  Recent Labs Lab 02/02/13 1550 02/02/13 2218 02/03/13 0405  TROPONINI <0.30 <0.30 <0.30   BNP (last 3 results) No results found for this basename: PROBNP,  in the last 8760 hours CBG: No results found for this basename: GLUCAP,  in the last 168 hours  Recent Results (from the past 240 hour(s))  CULTURE, BLOOD (ROUTINE X 2)     Status: None   Collection Time    02/02/13  9:00 AM      Result Value Range Status   Specimen Description BLOOD LEFT ANTECUBITAL   Final   Special Requests BOTTLES DRAWN AEROBIC ONLY [facility]   Final   Culture  Setup Time     Final   Value: 02/03/2013 08:06     Performed at [facility]  Culture     Final   Value:        BLOOD CULTURE RECEIVED NO GROWTH TO DATE CULTURE WILL BE HELD FOR 5 DAYS BEFORE ISSUING A FINAL NEGATIVE REPORT     Performed at [facility]   Report Status PENDING   Incomplete  URINE CULTURE     Status: None   Collection Time    02/02/13  9:00 AM      Result Value Range Status   Specimen Description URINE, CATHETERIZED   Final   Special Requests NONE   Final   Culture  Setup Time     Final   Value: 02/02/2013 14:13     Performed at [facility] Count     Final   Value: >=100,000 COLONIES/ML     Performed at [facility]   Culture     Final   Value: KLEBSIELLA PNEUMONIAE     Performed at [facility]   Report Status 02/03/2013 FINAL   Final   Organism ID, Bacteria KLEBSIELLA PNEUMONIAE   Final  CULTURE, BLOOD  (ROUTINE X 2)     Status: None   Collection Time    02/02/13  9:15 AM      Result Value Range Status   Specimen Description BLOOD HAND LEFT   Final   Special Requests BOTTLES DRAWN AEROBIC ONLY [facility]   Final   Culture  Setup Time     Final   Value: 02/02/2013 14:06     Performed at [facility]   Culture     Final   Value:        BLOOD CULTURE RECEIVED NO GROWTH TO DATE CULTURE WILL BE HELD FOR 5 DAYS BEFORE ISSUING A FINAL NEGATIVE REPORT     Performed at [facility]   Report Status PENDING   Incomplete     Studies: No results found.  Scheduled Meds: . cephALEXin  500 mg Oral Q12H  . clopidogrel  75 mg Oral Q breakfast  . feeding supplement  1 Container Oral TID BM  . heparin  5,000 Units Subcutaneous Q8H  . meloxicam  15 mg Oral Daily  . metoprolol tartrate  50 mg Oral BID  . simvastatin  10 mg Oral q1800   Continuous Infusions:    Active Problems:   Hypertension   Acute encephalopathy   UTI (urinary tract infection)   Hyponatremia    Time spent: 25    [facility] C  [facility] Pager [phone]. If 7PM-7AM, please contact night-coverage at [email or web address], password [facility] 02/05/2013, 4:23 PM  LOS: 3 days

## 2013-02-06 DIAGNOSIS — D649 Anemia, unspecified: Secondary | ICD-10-CM

## 2013-02-06 NOTE — Progress Notes (Signed)
[facility] PROGRESS NOTE  Holly Allen GNF:[id number] DOB: May 23, 1915 DOA: 02/02/2013 PCP: Pamelia Hoit, MD  Assessment/Plan: Subacute CVA  -MRI with late subacute ischemic infarct in posterior limb of left internal capsule and small lacunar infarct in the high posterior left parietal lobe -continue Plavix. Per neuro no echo carotid Doppler is as would not change therapy in this patient -PT OT recommending SNF -Neurology following Hyponatremia  -resolved with hydration -TSH within normal limits at 1.69 Kleb UTI (urinary tract infection)  -contitnue keflex-it is sensitive Acute encephalopathy -Secondary to above, as well as Ativan initially. - troponins so far negative -clinically improved Hypertension  -continue metoprolol, taking by mouth meds  -continue norvasc, BP control better COPD  - better with nebs   Code Status: full Family Communication: Son and daughters at bedside Disposition Plan: wait SNF in am   Consultants:  neuro  Procedures:  none  Antibiotics:  Rocephin started   HPI/Subjective: Per family confused last pm, but better today. Breathing more comfortably today  Objective: Filed Vitals:   02/06/13 0631  BP: 137/70  Pulse:   Temp: 97.4 F (36.3 C)  Resp: 18    Intake/Output Summary (Last 24 hours) at 02/06/13 1218 Last data filed at 02/06/13 0900  Gross per 24 hour  Intake    800 ml  Output      0 ml  Net    800 ml   Filed Weights   02/02/13 1700  Weight: 45.9 kg (101 lb 3.1 oz)   Exam:  General: alert and appropriate in NAD Cardiovascular: RRR, nl S1 s2 Respiratory: few wheezes, dcreased air entry Abdomen: soft +BS NT/ND, no masses palpable Extremities: No cyanosis and no edema     Data Reviewed: Basic Metabolic Panel:  Recent Labs Lab 02/02/13 0900 02/02/13 1550 02/03/13 0838 02/04/13 0710  NA 126*  --  131* 136  K 3.8  --  3.6 3.7  CL 88*  --  96 103  CO2 27  --  24 25  GLUCOSE 177*  --  112* 109*   BUN 14  --  17 15  CREATININE 0.72 0.75 0.74 0.67  CALCIUM 9.5  --  9.0 9.1   Liver Function Tests:  Recent Labs Lab 02/02/13 0900  AST 14  ALT 10  ALKPHOS 160*  BILITOT 1.1  PROT 6.6  ALBUMIN 3.4*   No results found for this basename: LIPASE, AMYLASE,  in the last 168 hours No results found for this basename: AMMONIA,  in the last 168 hours CBC:  Recent Labs Lab 02/02/13 0900 02/02/13 1550  WBC 9.6 8.6  HGB 14.6 14.1  HCT 41.0 39.5  MCV 82.3 81.4  PLT 216 222   Cardiac Enzymes:  Recent Labs Lab 02/02/13 1550 02/02/13 2218 02/03/13 0405  TROPONINI <0.30 <0.30 <0.30   BNP (last 3 results) No results found for this basename: PROBNP,  in the last 8760 hours CBG: No results found for this basename: GLUCAP,  in the last 168 hours  Recent Results (from the past 240 hour(s))  CULTURE, BLOOD (ROUTINE X 2)     Status: None   Collection Time    02/02/13  9:00 AM      Result Value Range Status   Specimen Description BLOOD LEFT ANTECUBITAL   Final   Special Requests BOTTLES DRAWN AEROBIC ONLY [facility]   Final   Culture  Setup Time     Final   Value: 02/03/2013 08:06     Performed at [facility]   Culture     Final   Value:        BLOOD CULTURE RECEIVED NO GROWTH TO DATE CULTURE WILL BE HELD FOR 5 DAYS BEFORE ISSUING A FINAL NEGATIVE REPORT     Performed at [facility]   Report Status PENDING   Incomplete  URINE CULTURE     Status: None   Collection Time    02/02/13  9:00 AM      Result Value Range Status   Specimen Description URINE, CATHETERIZED   Final   Special Requests NONE   Final   Culture  Setup Time     Final   Value: 02/02/2013 14:13     Performed at [facility] Count     Final   Value: >=100,000 COLONIES/ML     Performed at [facility]   Culture     Final   Value: KLEBSIELLA PNEUMONIAE     Performed at [facility]   Report Status 02/03/2013 FINAL   Final   Organism ID, Bacteria KLEBSIELLA  PNEUMONIAE   Final  CULTURE, BLOOD (ROUTINE X 2)     Status: None   Collection Time    02/02/13  9:15 AM      Result Value Range Status   Specimen Description BLOOD HAND LEFT   Final   Special Requests BOTTLES DRAWN AEROBIC ONLY [facility]   Final   Culture  Setup Time     Final   Value: 02/02/2013 14:06     Performed at [facility]   Culture     Final   Value:        BLOOD CULTURE RECEIVED NO GROWTH TO DATE CULTURE WILL BE HELD FOR 5 DAYS BEFORE ISSUING A FINAL NEGATIVE REPORT     Performed at [facility]   Report Status PENDING   Incomplete     Studies: No results found.  Scheduled Meds: . amLODipine  5 mg Oral Daily  . cephALEXin  500 mg Oral Q12H  . clopidogrel  75 mg Oral Q breakfast  . feeding supplement  1 Container Oral TID BM  . heparin  5,000 Units Subcutaneous Q8H  . levalbuterol  0.63 mg Nebulization Q8H  . meloxicam  15 mg Oral Daily  . metoprolol tartrate  50 mg Oral BID  . simvastatin  10 mg Oral q1800   Continuous Infusions:    Active Problems:   Hypertension   Acute encephalopathy   UTI (urinary tract infection)   Hyponatremia    Time spent: 25    [facility] C  [facility] Pager [phone]. If 7PM-7AM, please contact night-coverage at [email or web address], password [facility] 02/06/2013, 12:18 PM  LOS: 4 days

## 2013-02-07 DIAGNOSIS — N390 Urinary tract infection, site not specified: Secondary | ICD-10-CM

## 2013-02-07 MED ORDER — TRAMADOL HCL 50 MG PO TABS: 25.0000 mg | ORAL_TABLET | Freq: Four times a day (QID) | ORAL | Status: DC | PRN

## 2013-02-07 MED ORDER — CLOPIDOGREL BISULFATE 75 MG PO TABS: 75.0000 mg | ORAL_TABLET | Freq: Every day | ORAL | Status: AC

## 2013-02-07 MED ORDER — CEPHALEXIN 500 MG PO CAPS: 500.0000 mg | ORAL_CAPSULE | Freq: Two times a day (BID) | ORAL | Status: AC

## 2013-02-07 MED ORDER — RESOURCE THICKENUP CLEAR PO POWD: ORAL | Status: DC

## 2013-02-07 MED ORDER — ENSURE PUDDING PO PUDG: 1.0000 | Freq: Three times a day (TID) | ORAL | Status: DC

## 2013-02-07 MED ORDER — SENNOSIDES-DOCUSATE SODIUM 8.6-50 MG PO TABS: 1.0000 | ORAL_TABLET | Freq: Every evening | ORAL | Status: DC | PRN

## 2013-02-07 MED ORDER — AMLODIPINE BESYLATE 5 MG PO TABS: 5.0000 mg | ORAL_TABLET | Freq: Every day | ORAL | Status: DC

## 2013-02-07 MED ORDER — LEVALBUTEROL HCL 0.63 MG/3ML IN NEBU: 1.0000 | INHALATION_SOLUTION | Freq: Four times a day (QID) | RESPIRATORY_TRACT | Status: DC | PRN

## 2013-02-07 NOTE — Discharge Summary (Signed)
Physician Discharge Summary  Holly Allen FAO:[id number] DOB: 02-Jan-1915 DOA: 02/02/2013  PCP: Pamelia Hoit, MD  Admit date: 02/02/2013 Discharge date: 02/07/2013  Time spent:  minutes  Recommendations for Outpatient Follow-up:  Follow-up Information   Please follow up. (SNF MD in 1-2days)       Follow up with Gates Rigg, MD. (neurologist, as needed)    Contact information:   [address] [place] [address] [phone]     Speech Therapy followup  For swallow re-eval  Discharge Diagnoses:  Active Problems:   Hypertension   Acute encephalopathy   UTI (urinary tract infection)   Hyponatremia  dysphagia  Discharge Condition: Improved/stable  Diet recommendation: Dysphagia 1 diet with nectar thick liquids  Filed Weights   02/02/13 1700  Weight: 45.9 kg (101 lb 3.1 oz)    History of present illness:  Holly Allen is a 79 y.o. female with history of TIA, hypertension who presents with altered mental status. History is obtained from family at bedside. They reported that on day prior to admission-patient seemed to be at her baseline ambulating with a walker her till later in the day are when they noted she had some slurred speech and also left hand/upper extremity clumsiness. As the evening went by her son states that she had difficulty finding and putting on her pajamas and he had to help her. Subsequently sometime about 2 AM he states, that he heard what seemed be like she was going to bedside commode, and didn't think too much about it. Later this morning when he woke up and checked on her she was still sitting on the commode and from the appearance of her legs she had been sitting there for a long time. He states she was incoherent and was not responding much verbally. So he helped her to the bed and they called EMS. In the ED the patient had a CT scan which showed question recent infarct in the periphery of the white matter of the mid superior right  centrum semiovale, sodium 126, urinalysis trace leukocyte esterase with 3-6 WBCs and many bacteria. Neurology was consulted, and arm admission to [facility] are requested.   Hospital Course:  Subacute CVA  -As discussed above, upon admission patient had a CT scan of her head done which showed question recent infarct in the periphery of white matter of the mid superior right centrum semiovale. Following that which further evaluation was done- MRI with late subacute ischemic infarct in posterior limb of left internal capsule and small lacunar infarct in the high posterior left parietal lobe  -Neurology was consulted and recommended changing her aspirin to Plavix which was done. Per neuro no echo carotid Doppler is as would not change therapy in this patient  -PT OT recommending SNF  -She is to follow up outpatient with neurology as needed Hyponatremia  -resolved with hydration  -TSH within normal limits at 1.69  Kleb UTI (urinary tract infection)  -She was treated with cephalosporin and is to complete Keflex upon discharge. Acute encephalopathy  -Secondary to above, as well as Ativan initially.  - troponins so far negative  -clinically improved with treatment as above Hypertension  -Permissive hypertension was allowed initially and subsequently her metoprolol was resumed and on followup Norvasc added for better blood pressure control COPD  - Stable, she was treated with when necessary nebulizers Dysphagia  -Patient at this swallow eval done and dysphagia 1 diet with nectar liquids was recommended. Speech therapy to follow up a  nursing facility for followup reevaluation and adjustment of her diet as appropriate Consultants:  neuro Procedures:  none    Discharge Exam: Filed Vitals:   02/07/13 1046  BP: 131/49  Pulse: 108  Temp:   Resp:     Exam:  General: alert and appropriate in NAD  Cardiovascular: RRR, nl S1 s2  Respiratory: few wheezes, dcreased air entry  Abdomen: soft +BS  NT/ND, no masses palpable  Extremities: No cyanosis and no edema    Discharge Instructions  Discharge Orders   Future Orders Complete By Expires     Diet - low sodium heart healthy  As directed     Increase activity slowly  As directed         Medication List    STOP taking these medications       aspirin EC 81 MG tablet      TAKE these medications       amLODipine 5 MG tablet  Commonly known as:  NORVASC  Take 1 tablet (5 mg total) by mouth daily.     cephALEXin 500 MG capsule  Commonly known as:  KEFLEX  Take 1 capsule (500 mg total) by mouth every 12 (twelve) hours.     cholecalciferol 1000 UNITS tablet  Commonly known as:  VITAMIN D  Take 1,000 Units by mouth daily.     clopidogrel 75 MG tablet  Commonly known as:  PLAVIX  Take 1 tablet (75 mg total) by mouth daily with breakfast.     feeding supplement Pudg  Take 1 Container by mouth 3 (three) times daily between meals.     meloxicam 15 MG tablet  Commonly known as:  MOBIC  Take 15 mg by mouth daily.     metoprolol 50 MG tablet  Commonly known as:  LOPRESSOR  Take 50 mg by mouth 2 (two) times daily.     pravastatin 20 MG tablet  Commonly known as:  PRAVACHOL  Take 20 mg by mouth daily.     RESOURCE THICKENUP CLEAR Powd  All liquids to be nectar thickened     senna-docusate 8.6-50 MG per tablet  Commonly known as:  Senokot-S  Take 1 tablet by mouth at bedtime as needed.     traMADol 50 MG tablet  Commonly known as:  ULTRAM  Take 0.5 tablets (25 mg total) by mouth every 6 (six) hours as needed for pain.       Xopenex nebulizer 0.63 MG/3ML every 6 hours when necessary   Allergies  Allergen Reactions  . Codeine     vomits  . Other Other (See Comments)    Muscle relaxant may have caused her stroke per family      The results of significant diagnostics from this hospitalization (including imaging, microbiology, ancillary and laboratory) are listed below for reference.    Significant  Diagnostic Studies: Ct Head Wo Contrast  02/02/2013   *RADIOLOGY REPORT*  Clinical Data: Slurred speech and altered mental status  CT HEAD WITHOUT CONTRAST  Technique:  Contiguous axial images were obtained from the base of the skull through the vertex without contrast. Study was obtained within 24 hours of patient arrival at the emergency department.  Comparison: Pain CT March 22, 2012 and brain MRI March 23, 2012  Findings:  There is mild diffuse atrophy.  There is no mass, hemorrhage, extra-axial fluid collection, or midline shift.  There is small vessel disease throughout the centra semiovale bilaterally.  There is some more recent appearing small vessel disease  in the mid superior right centrum semiovale.  A recent infarct in this area cannot be excluded on this study.  There is no focal vascular distribution type infarct on this study.  Bony calvarium appears intact.  The mastoid air cells are clear. There are cataracts present bilaterally.  IMPRESSION: Atrophy with small vessel disease.  Question recent infarct in the periphery of the white matter of the mid superior right centrum semiovale.  No hemorrhage or mass effect.   Original Report Authenticated By: Bretta Bang, M.D.   Mr Brain Wo Contrast  02/02/2013   *RADIOLOGY REPORT*  Clinical Data: Stroke  MRI HEAD WITHOUT CONTRAST  Technique:  Multiplanar, multiecho pulse sequences of the brain and surrounding structures were obtained according to standard protocol without intravenous contrast.  Comparison: CT performed earlier on the same day as well as prior MRI from 03/23/2012.  Findings: Diffuse prominence of the CSF containing spaces was consistent with generalized atrophy.  Scattered and confluent hyperintense foci of T2 / FLAIR signal within the periventricular deep white matter is consistent with chronic small vessel ischemic changes.  Overall, the extent of this is slightly progressed as compared to the prior study.  Remote lacunar  infarct is present within the left centrum semi ovale.  Additional tiny focus of restricted diffusion is present within the high left parietal lobe (series 4, image 22).  Subtle foci of restricted diffusion are seen involving the posterior limb of the internal capsule on the left. There is a small amount of associated FLAIR signal with slightly decreased signal intensity on the ADC map, suggestive of a late subacute ischemic infarct.  Subtle focus of restricted diffusion within the right internal capsule is not densely demonstrate corresponding hypointense ADC signal or flow abnormality, and likely represents T2 shine through. No other acute intracranial hemorrhage or infarct identified.  There is no midline shift or mass lesion.  Orbital soft tissues are normal.  No extra-axial fluid collection.  Paranasal sinuses are clear.  IMPRESSION: 1.  Late subacute ischemic infarct involving the posterior limb of the left internal capsule as detailed above. 2.  Small lacunar infarct within the high posterior left parietal lobe, also likely subacute 3.  Atrophy with chronic small vessel ischemic changes, slightly progressed as compared to 03/23/2012   Original Report Authenticated By: Rise Mu, M.D.   Dg Chest Port 1 View  02/02/2013   *RADIOLOGY REPORT*  Clinical Data: Weakness, inability to communicate  PORTABLE CHEST - 1 VIEW  Comparison: Chest x-ray of 01/18/2013  Findings: The lungs remain somewhat hyperaerated.  No infiltrate or effusion is seen.  Mild cardiomegaly is stable.  Prominent pulmonary artery segments remain most consistent with a degree of pulmonary arterial hypertension.  The bones are osteopenic. Lower thoracic vertebroplasties are noted.  IMPRESSION: Hyperaeration consistent with emphysema.  No active lung disease. Stable mild cardiomegaly.   Original Report Authenticated By: Dwyane Dee, M.D.   Dg Abd Acute W/chest  01/18/2013   *RADIOLOGY REPORT*  Clinical Data: Abdominal pain.   Constipation.  ACUTE ABDOMEN SERIES (ABDOMEN 2 VIEW & CHEST 1 VIEW)  Comparison: Chest x-ray 03/19/2012.  Findings: Coarse interstitial markings and peribronchial cuffing noted throughout the lungs bilaterally.  Lung volumes are slightly low.  No acute consolidative airspace disease.  No pleural effusions.  No evidence of pulmonary edema.  Heart size is normal. Upper mediastinal contours are within normal limits. Atherosclerosis in the thoracic aorta.  Post procedural changes of vertebroplasty are noted at multiple levels of the lower thoracic  spine.  Supine and left lateral decubitus views of the abdomen demonstrate gas and stool scattered throughout the colon extending to the level of the distal rectum.  There appears to be a large volume of stool in the rectal vault, which may suggest underlying constipation.  No definite pathologic distension of small bowel.  No evidence of pneumoperitoneum.  Post procedural changes of the vertebroplasty at L5 and bilateral sacroplasty are noted.  Status post right hip hemiarthroplasty.  IMPRESSION: 1.  Nonobstructive bowel gas pattern, as above. 2.  Large amount of stool in the rectal vault may suggest constipation. 3.  Mild diffuse interstitial prominence and peribronchial cuffing may suggest bronchitis. 4.  Atherosclerosis. 5.  Postoperative and post procedural changes, as above.   Original Report Authenticated By: Trudie Reed, M.D.    Microbiology: Recent Results (from the past 240 hour(s))  CULTURE, BLOOD (ROUTINE X 2)     Status: None   Collection Time    02/02/13  9:00 AM      Result Value Range Status   Specimen Description BLOOD LEFT ANTECUBITAL   Final   Special Requests BOTTLES DRAWN AEROBIC ONLY 6CC   Final   Culture  Setup Time     Final   Value: 02/03/2013 08:06     Performed at [facility]   Culture     Final   Value:        BLOOD CULTURE RECEIVED NO GROWTH TO DATE CULTURE WILL BE HELD FOR 5 DAYS BEFORE ISSUING A FINAL NEGATIVE REPORT      Performed at [facility]   Report Status PENDING   Incomplete  URINE CULTURE     Status: None   Collection Time    02/02/13  9:00 AM      Result Value Range Status   Specimen Description URINE, CATHETERIZED   Final   Special Requests NONE   Final   Culture  Setup Time     Final   Value: 02/02/2013 14:13     Performed at [facility] Count     Final   Value: >=100,000 COLONIES/ML     Performed at [facility]   Culture     Final   Value: KLEBSIELLA PNEUMONIAE     Performed at [facility]   Report Status 02/03/2013 FINAL   Final   Organism ID, Bacteria KLEBSIELLA PNEUMONIAE   Final  CULTURE, BLOOD (ROUTINE X 2)     Status: None   Collection Time    02/02/13  9:15 AM      Result Value Range Status   Specimen Description BLOOD HAND LEFT   Final   Special Requests BOTTLES DRAWN AEROBIC ONLY 6CC   Final   Culture  Setup Time     Final   Value: 02/02/2013 14:06     Performed at [facility]   Culture     Final   Value: NO GROWTH 5 DAYS     Performed at [facility]   Report Status 02/07/2013 FINAL   Final     Labs: Basic Metabolic Panel:  Recent Labs Lab 02/02/13 0900 02/02/13 1550 02/03/13 0838 02/04/13 0710  NA 126*  --  131* 136  K 3.8  --  3.6 3.7  CL 88*  --  96 103  CO2 27  --  24 25  GLUCOSE 177*  --  112* 109*  BUN 14  --  17 15  CREATININE 0.72 0.75  0.74 0.67  CALCIUM 9.5  --  9.0 9.1   Liver Function Tests:  Recent Labs Lab 02/02/13 0900  AST 14  ALT 10  ALKPHOS 160*  BILITOT 1.1  PROT 6.6  ALBUMIN 3.4*   No results found for this basename: LIPASE, AMYLASE,  in the last 168 hours No results found for this basename: AMMONIA,  in the last 168 hours CBC:  Recent Labs Lab 02/02/13 0900 02/02/13 1550  WBC 9.6 8.6  HGB 14.6 14.1  HCT 41.0 39.5  MCV 82.3 81.4  PLT 216 222   Cardiac Enzymes:  Recent Labs Lab 02/02/13 1550 02/02/13 2218 02/03/13 0405  TROPONINI <0.30 <0.30  <0.30   BNP: BNP (last 3 results) No results found for this basename: PROBNP,  in the last 8760 hours CBG: No results found for this basename: GLUCAP,  in the last 168 hours     Signed:  Camren Lipsett C  [facility] 02/07/2013, 11:15 AM

## 2013-02-07 NOTE — Progress Notes (Signed)
Pt discharged to [facility] per MD order. Pt and daughter received and reviewed all discharge instructions and medication information including follow-up appointments and prescription information. Pt oriented to person and situation at time of discharge (baseline history of dementia). Pt transferred to [facility] via ambulance by [facility]. Report given to Rodney Booze, RN at discharge. All belongings sent with pt. Joylene Grapes

## 2013-02-07 NOTE — Progress Notes (Signed)
CSW left voicemail for facility admissions to make aware that pt is ready for dc today. CSW awaiting return phone call to proceed with arranging transportation and contacting family.   Orlin Kann, LCSWA [phone]

## 2013-02-07 NOTE — Care Management Note (Signed)
    Page 1 of 1   02/07/2013     11:51:29 AM   CARE MANAGEMENT NOTE 02/07/2013  Patient:  Holly Allen, Holly Allen   Account Number:  [id number]  Date Initiated:  02/07/2013  Documentation initiated by:  GRAVES-BIGELOW,Aliene Tamura  Subjective/Objective Assessment:   Pt admitted with Subacute CVA. Plan is  to be d/c to SNF today. CSW is working with pt and family for disposition needs.     Action/Plan:   No further needs from CM  at this time.   Anticipated DC Date:  02/07/2013   Anticipated DC Plan:  SKILLED NURSING FACILITY  In-house referral  Clinical Social Worker      DC Planning Services  CM consult      Choice offered to / List presented to:             Status of service:  Completed, signed off Medicare Important Message given?   (If response is "NO", the following Medicare IM given date fields will be blank) Date Medicare IM given:   Date Additional Medicare IM given:    Discharge Disposition:  SKILLED NURSING FACILITY  Per UR Regulation:  Reviewed for med. necessity/level of care/duration of stay  If discussed at Long Length of Stay Meetings, dates discussed:    Comments:

## 2013-02-09 LAB — CULTURE, BLOOD (ROUTINE X 2)

## 2013-08-06 ENCOUNTER — Encounter: Payer: Self-pay | Admitting: *Deleted

## 2013-12-05 ENCOUNTER — Emergency Department (HOSPITAL_COMMUNITY): Payer: Medicare Other

## 2013-12-05 ENCOUNTER — Observation Stay (HOSPITAL_COMMUNITY): Payer: Medicare Other

## 2013-12-05 ENCOUNTER — Encounter (HOSPITAL_COMMUNITY): Payer: Self-pay | Admitting: Emergency Medicine

## 2013-12-05 ENCOUNTER — Inpatient Hospital Stay (HOSPITAL_COMMUNITY)
Admission: EM | Admit: 2013-12-05 | Discharge: 2013-12-09 | DRG: 689 | Disposition: A | Discharge status: Skilled Nursing Facility | Payer: Medicare Other | Attending: Internal Medicine | Admitting: Internal Medicine

## 2013-12-05 DIAGNOSIS — R64 Cachexia: Secondary | ICD-10-CM | POA: Diagnosis present

## 2013-12-05 DIAGNOSIS — G9340 Encephalopathy, unspecified: Secondary | ICD-10-CM

## 2013-12-05 DIAGNOSIS — J9600 Acute respiratory failure, unspecified whether with hypoxia or hypercapnia: Secondary | ICD-10-CM | POA: Diagnosis present

## 2013-12-05 DIAGNOSIS — E875 Hyperkalemia: Secondary | ICD-10-CM | POA: Diagnosis present

## 2013-12-05 DIAGNOSIS — I509 Heart failure, unspecified: Secondary | ICD-10-CM | POA: Diagnosis present

## 2013-12-05 DIAGNOSIS — J441 Chronic obstructive pulmonary disease with (acute) exacerbation: Secondary | ICD-10-CM

## 2013-12-05 DIAGNOSIS — W19XXXA Unspecified fall, initial encounter: Secondary | ICD-10-CM

## 2013-12-05 DIAGNOSIS — R4182 Altered mental status, unspecified: Secondary | ICD-10-CM

## 2013-12-05 DIAGNOSIS — F0390 Unspecified dementia without behavioral disturbance: Secondary | ICD-10-CM | POA: Diagnosis present

## 2013-12-05 DIAGNOSIS — I5031 Acute diastolic (congestive) heart failure: Secondary | ICD-10-CM | POA: Diagnosis present

## 2013-12-05 DIAGNOSIS — J449 Chronic obstructive pulmonary disease, unspecified: Secondary | ICD-10-CM

## 2013-12-05 DIAGNOSIS — Z79899 Other long term (current) drug therapy: Secondary | ICD-10-CM

## 2013-12-05 DIAGNOSIS — I639 Cerebral infarction, unspecified: Secondary | ICD-10-CM

## 2013-12-05 DIAGNOSIS — I10 Essential (primary) hypertension: Secondary | ICD-10-CM

## 2013-12-05 DIAGNOSIS — Z681 Body mass index (BMI) 19 or less, adult: Secondary | ICD-10-CM

## 2013-12-05 DIAGNOSIS — S0100XA Unspecified open wound of scalp, initial encounter: Secondary | ICD-10-CM | POA: Diagnosis present

## 2013-12-05 DIAGNOSIS — Y92009 Unspecified place in unspecified non-institutional (private) residence as the place of occurrence of the external cause: Secondary | ICD-10-CM

## 2013-12-05 DIAGNOSIS — E43 Unspecified severe protein-calorie malnutrition: Secondary | ICD-10-CM

## 2013-12-05 DIAGNOSIS — Z8673 Personal history of transient ischemic attack (TIA), and cerebral infarction without residual deficits: Secondary | ICD-10-CM

## 2013-12-05 DIAGNOSIS — Z96649 Presence of unspecified artificial hip joint: Secondary | ICD-10-CM

## 2013-12-05 DIAGNOSIS — G929 Unspecified toxic encephalopathy: Secondary | ICD-10-CM | POA: Diagnosis present

## 2013-12-05 DIAGNOSIS — J4489 Other specified chronic obstructive pulmonary disease: Secondary | ICD-10-CM

## 2013-12-05 DIAGNOSIS — G3184 Mild cognitive impairment, so stated: Secondary | ICD-10-CM | POA: Diagnosis present

## 2013-12-05 DIAGNOSIS — D649 Anemia, unspecified: Secondary | ICD-10-CM

## 2013-12-05 DIAGNOSIS — D509 Iron deficiency anemia, unspecified: Secondary | ICD-10-CM

## 2013-12-05 DIAGNOSIS — G92 Toxic encephalopathy: Secondary | ICD-10-CM | POA: Diagnosis present

## 2013-12-05 DIAGNOSIS — N390 Urinary tract infection, site not specified: Principal | ICD-10-CM

## 2013-12-05 DIAGNOSIS — R55 Syncope and collapse: Secondary | ICD-10-CM

## 2013-12-05 DIAGNOSIS — R319 Hematuria, unspecified: Secondary | ICD-10-CM

## 2013-12-05 DIAGNOSIS — R5381 Other malaise: Secondary | ICD-10-CM | POA: Diagnosis present

## 2013-12-05 DIAGNOSIS — Z7902 Long term (current) use of antithrombotics/antiplatelets: Secondary | ICD-10-CM

## 2013-12-05 DIAGNOSIS — Z66 Do not resuscitate: Secondary | ICD-10-CM | POA: Diagnosis present

## 2013-12-05 DIAGNOSIS — J9601 Acute respiratory failure with hypoxia: Secondary | ICD-10-CM

## 2013-12-05 HISTORY — DX: Malignant (primary) neoplasm, unspecified: C80.1

## 2013-12-05 HISTORY — DX: Scarlet fever, uncomplicated: A38.9

## 2013-12-05 HISTORY — DX: Personal history of other medical treatment: Z92.89

## 2013-12-05 HISTORY — DX: Urinary tract infection, site not specified: N39.0

## 2013-12-05 HISTORY — DX: Pneumonia, unspecified organism: J18.9

## 2013-12-05 HISTORY — DX: Low back pain, unspecified: M54.50

## 2013-12-05 HISTORY — DX: Cerebral infarction, unspecified: I63.9

## 2013-12-05 HISTORY — DX: Low back pain: M54.5

## 2013-12-05 HISTORY — DX: Vitamin B12 deficiency anemia due to intrinsic factor deficiency: D51.0

## 2013-12-05 HISTORY — DX: Other chronic pain: G89.29

## 2013-12-05 LAB — TSH: TSH: 2 u[IU]/mL (ref 0.350–4.500)

## 2013-12-05 LAB — BASIC METABOLIC PANEL
BUN: 17 mg/dL (ref 6–23)
CHLORIDE: 96 meq/L (ref 96–112)
CO2: 21 meq/L (ref 19–32)
Calcium: 9.3 mg/dL (ref 8.4–10.5)
Creatinine, Ser: 0.65 mg/dL (ref 0.50–1.10)
GFR calc Af Amer: 84 mL/min — ABNORMAL LOW (ref >90)
GFR calc non Af Amer: 73 mL/min — ABNORMAL LOW (ref >90)
GLUCOSE: 159 mg/dL — AB (ref 70–99)
POTASSIUM: 5.5 meq/L — AB (ref 3.7–5.3)
SODIUM: 132 meq/L — AB (ref 137–147)

## 2013-12-05 LAB — URINALYSIS, ROUTINE W REFLEX MICROSCOPIC
BILIRUBIN URINE: NEGATIVE
Glucose, UA: NEGATIVE mg/dL
Ketones, ur: 15 mg/dL — AB
LEUKOCYTES UA: NEGATIVE
NITRITE: NEGATIVE
PH: 6 (ref 5.0–8.0)
Protein, ur: 30 mg/dL — AB
SPECIFIC GRAVITY, URINE: 1.022 (ref 1.005–1.030)
UROBILINOGEN UA: 0.2 mg/dL (ref 0.0–1.0)

## 2013-12-05 LAB — URINE MICROSCOPIC-ADD ON

## 2013-12-05 LAB — LACTIC ACID, PLASMA: Lactic Acid, Venous: 2 mmol/L (ref 0.5–2.2)

## 2013-12-05 LAB — CBC
HCT: 42.6 % (ref 36.0–46.0)
HEMOGLOBIN: 13.8 g/dL (ref 12.0–15.0)
MCH: 26.8 pg (ref 26.0–34.0)
MCHC: 32.4 g/dL (ref 30.0–36.0)
MCV: 82.9 fL (ref 78.0–100.0)
PLATELETS: 163 10*3/uL (ref 150–400)
RBC: 5.14 MIL/uL — AB (ref 3.87–5.11)
RDW: 13.9 % (ref 11.5–15.5)
WBC: 9.9 10*3/uL (ref 4.0–10.5)

## 2013-12-05 LAB — TROPONIN I: Troponin I: <0.3 ng/mL (ref <0.30)

## 2013-12-05 LAB — POTASSIUM: Potassium: 4.5 mEq/L (ref 3.7–5.3)

## 2013-12-05 LAB — PROTIME-INR
INR: 1.1 (ref 0.00–1.49)
PROTHROMBIN TIME: 14 s (ref 11.6–15.2)

## 2013-12-05 MED ORDER — METOPROLOL TARTRATE 50 MG PO TABS
50.0000 mg | ORAL_TABLET | Freq: Two times a day (BID) | ORAL | Status: DC
  Filled 2013-12-05: qty 1

## 2013-12-05 MED ORDER — DEXTROSE 5 % IV SOLN
1.0000 g | INTRAVENOUS | Status: DC
  Administered 2013-12-06 – 2013-12-07 (×2): 1 g via INTRAVENOUS
  Filled 2013-12-05 (×3): qty 10

## 2013-12-05 MED ORDER — LORAZEPAM 2 MG/ML IJ SOLN
0.5000 mg | Freq: Once | INTRAMUSCULAR | Status: AC
  Administered 2013-12-05: 0.5 mg via INTRAVENOUS
  Filled 2013-12-05: qty 1

## 2013-12-05 MED ORDER — BACITRACIN-NEOMYCIN-POLYMYXIN OINTMENT TUBE
TOPICAL_OINTMENT | Freq: Three times a day (TID) | CUTANEOUS | Status: DC
  Administered 2013-12-06 – 2013-12-09 (×8): via TOPICAL
  Filled 2013-12-05 (×2): qty 15

## 2013-12-05 MED ORDER — SODIUM CHLORIDE 0.9 % IV SOLN
Freq: Once | INTRAVENOUS | Status: AC
Start: 2013-12-05 — End: 2013-12-05
  Administered 2013-12-05: 13:00:00 via INTRAVENOUS

## 2013-12-05 MED ORDER — ALBUTEROL SULFATE (2.5 MG/3ML) 0.083% IN NEBU: 2.5000 mg | INHALATION_SOLUTION | Freq: Two times a day (BID) | RESPIRATORY_TRACT | Status: DC

## 2013-12-05 MED ORDER — GUAIFENESIN-DM 100-10 MG/5ML PO SYRP
5.0000 mL | ORAL_SOLUTION | ORAL | Status: DC | PRN
  Filled 2013-12-05: qty 5

## 2013-12-05 MED ORDER — ALBUTEROL SULFATE (2.5 MG/3ML) 0.083% IN NEBU
2.5000 mg | INHALATION_SOLUTION | RESPIRATORY_TRACT | Status: DC | PRN
  Administered 2013-12-05: 2.5 mg via RESPIRATORY_TRACT
  Filled 2013-12-05: qty 3

## 2013-12-05 MED ORDER — ONDANSETRON HCL 4 MG/2ML IJ SOLN: 4.0000 mg | Freq: Four times a day (QID) | INTRAMUSCULAR | Status: DC | PRN

## 2013-12-05 MED ORDER — LORAZEPAM 2 MG/ML IJ SOLN
1.0000 mg | INTRAMUSCULAR | Status: DC | PRN
  Administered 2013-12-05: 1 mg via INTRAVENOUS
  Administered 2013-12-08: 0.5 mg via INTRAVENOUS
  Filled 2013-12-05 (×2): qty 1

## 2013-12-05 MED ORDER — ONDANSETRON HCL 4 MG PO TABS: 4.0000 mg | ORAL_TABLET | Freq: Four times a day (QID) | ORAL | Status: DC | PRN

## 2013-12-05 MED ORDER — POLYETHYLENE GLYCOL 3350 17 G PO PACK
17.0000 g | PACK | Freq: Every day | ORAL | Status: DC | PRN
  Filled 2013-12-05: qty 1

## 2013-12-05 MED ORDER — VITAMIN D3 25 MCG (1000 UNIT) PO TABS
1000.0000 [IU] | ORAL_TABLET | Freq: Every day | ORAL | Status: DC
  Filled 2013-12-05 (×2): qty 1

## 2013-12-05 MED ORDER — METOPROLOL TARTRATE 1 MG/ML IV SOLN
5.0000 mg | INTRAVENOUS | Status: DC | PRN
  Administered 2013-12-05 – 2013-12-06 (×2): 5 mg via INTRAVENOUS
  Filled 2013-12-05 (×2): qty 5

## 2013-12-05 MED ORDER — DEXTROSE 5 % IV SOLN
1.0000 g | Freq: Once | INTRAVENOUS | Status: AC
  Administered 2013-12-05: 1 g via INTRAVENOUS
  Filled 2013-12-05: qty 10

## 2013-12-05 MED ORDER — SODIUM CHLORIDE 0.9 % IJ SOLN
3.0000 mL | Freq: Two times a day (BID) | INTRAMUSCULAR | Status: DC
  Administered 2013-12-05 – 2013-12-08 (×6): 3 mL via INTRAVENOUS

## 2013-12-05 MED ORDER — FUROSEMIDE 10 MG/ML IJ SOLN
20.0000 mg | Freq: Once | INTRAMUSCULAR | Status: AC
  Administered 2013-12-05: 20 mg via INTRAVENOUS
  Filled 2013-12-05: qty 2

## 2013-12-05 MED ORDER — SODIUM CHLORIDE 0.9 % IV BOLUS (SEPSIS)
500.0000 mL | Freq: Once | INTRAVENOUS | Status: AC
  Administered 2013-12-05: 500 mL via INTRAVENOUS

## 2013-12-05 MED ORDER — HYDRALAZINE HCL 20 MG/ML IJ SOLN
10.0000 mg | Freq: Four times a day (QID) | INTRAMUSCULAR | Status: DC | PRN
  Administered 2013-12-05: 18:00:00 via INTRAVENOUS
  Filled 2013-12-05: qty 1

## 2013-12-05 MED ORDER — METOPROLOL TARTRATE 1 MG/ML IV SOLN: 5.0000 mg | INTRAVENOUS | Status: DC | PRN

## 2013-12-05 MED ORDER — NEOMYCIN-POLYMYXIN-PRAMOXINE 1 % EX CREA
TOPICAL_CREAM | Freq: Three times a day (TID) | CUTANEOUS | Status: DC
  Administered 2013-12-06: via TOPICAL
  Filled 2013-12-05 (×2): qty 28

## 2013-12-05 MED ORDER — CLOPIDOGREL BISULFATE 75 MG PO TABS: 75.0000 mg | ORAL_TABLET | Freq: Every day | ORAL | Status: DC

## 2013-12-05 MED ORDER — ALUM & MAG HYDROXIDE-SIMETH 200-200-20 MG/5ML PO SUSP: 30.0000 mL | Freq: Four times a day (QID) | ORAL | Status: DC | PRN

## 2013-12-05 MED ORDER — MORPHINE SULFATE 2 MG/ML IJ SOLN: 2.0000 mg | INTRAMUSCULAR | Status: DC | PRN

## 2013-12-05 MED ORDER — SODIUM CHLORIDE 0.9 % IV SOLN
INTRAVENOUS | Status: DC
  Administered 2013-12-05: 16:00:00 via INTRAVENOUS

## 2013-12-05 MED ORDER — HYDROCODONE-ACETAMINOPHEN 5-325 MG PO TABS: 1.0000 | ORAL_TABLET | ORAL | Status: DC | PRN

## 2013-12-05 NOTE — ED Provider Notes (Signed)
CSN: [id number]     Arrival date & time 12/05/13  7035 History   First MD Initiated Contact with Patient 12/05/13 0932     Chief Complaint  Patient presents with  . Fall    Level 5 caveat: Altered mental status  HPI Patient is brought to the emergency department after a fall yesterday.  The patient was in her normal state of health and she'll fall and struck the back of her head.  She is on Plavix.  She broke emergent [place] this morning for increasing confusion and altered mental status and a change from her baseline mental state per family.  They also report that she has frequent urinary tract infections and this is how she presents when she has those issues as well.  No recent vomiting or diarrhea.  No change in medications.  No fevers or chills.  Noted to be hypertensive on arrival at 201/110    Past Medical History  Diagnosis Date  . Hypertension   . TIA (transient ischemic attack)     history without residual deficits  . Leukocytosis   . Iron deficiency anemia   . COPD (chronic obstructive pulmonary disease)    Past Surgical History  Procedure Laterality Date  . Hip arthroplasty  03/17/2012    Procedure: ARTHROPLASTY BIPOLAR HIP;  Surgeon: Mcarthur Rossetti, MD;  Location: [facility];  Service: Orthopedics;  Laterality: Right;  . Ankle fracture surgery     No family history on file. History  Substance Use Topics  . Smoking status: Never Smoker   . Smokeless tobacco: Never Used  . Alcohol Use: No   OB History   Grav Para Term Preterm Abortions TAB SAB Ect Mult Living                 Review of Systems  Unable to perform ROS: Mental status change      Allergies  Codeine and Other  Home Medications   Prior to Admission medications   Medication Sig Start Date End Date Taking? Authorizing Provider  albuterol (PROVENTIL) (2.5 MG/3ML) 0.083% nebulizer solution Take 2.5 mg by nebulization 2 (two) times daily.   Yes Historical Provider, MD  cholecalciferol (VITAMIN D)  1000 UNITS tablet Take 1,000 Units by mouth daily.   Yes Historical Provider, MD  clopidogrel (PLAVIX) 75 MG tablet Take 75 mg by mouth daily with breakfast.   Yes Historical Provider, MD  HYDROcodone-acetaminophen (NORCO/VICODIN) 5-325 MG per tablet Take 0.5 tablets by mouth 2 (two) times daily as needed (pain).   Yes Historical Provider, MD  metoprolol (LOPRESSOR) 50 MG tablet Take 50 mg by mouth 2 (two) times daily.   Yes Historical Provider, MD   BP 176/96  Pulse 105  Temp(Src) 98 F (36.7 C) (Axillary)  Resp 22  SpO2 98% Physical Exam  Nursing note and vitals reviewed. Constitutional: She appears well-developed and well-nourished. No distress.  HENT:  Head: Normocephalic and atraumatic.  Small laceration to her posterior scalp this coagulated with blood at this time.  No active bleeding.  Eyes: EOM are normal.  Neck: Neck supple.  Cervical and paracervical tenderness without cervical step-offs  Cardiovascular: Regular rhythm and normal heart sounds.   Tachycardia  Pulmonary/Chest: Effort normal and breath sounds normal.  Abdominal: Soft. She exhibits no distension. There is no tenderness.  Musculoskeletal: Normal range of motion.  Neurological:  Opens eyes to voice and painful stimuli.  Follow simple commands  Skin: Skin is warm and dry.  Psychiatric: She has a normal  mood and affect. Judgment normal.    ED Course  Procedures (including critical care time) Labs Review Labs Reviewed  CBC - Abnormal; Notable for the following:    RBC 5.14 (*)    All other components within normal limits  BASIC METABOLIC PANEL - Abnormal; Notable for the following:    Sodium 132 (*)    Potassium 5.5 (*)    Glucose, Bld 159 (*)    GFR calc non Af Amer 73 (*)    GFR calc Af Amer 84 (*)    All other components within normal limits  URINALYSIS, ROUTINE W REFLEX MICROSCOPIC - Abnormal; Notable for the following:    Color, Urine AMBER (*)    APPearance CLOUDY (*)    Hgb urine dipstick  LARGE (*)    Ketones, ur 15 (*)    Protein, ur 30 (*)    All other components within normal limits  URINE MICROSCOPIC-ADD ON - Abnormal; Notable for the following:    Squamous Epithelial / LPF FEW (*)    Bacteria, UA MANY (*)    All other components within normal limits  PROTIME-INR  LACTIC ACID, PLASMA  POTASSIUM  TSH  TROPONIN I  TROPONIN I  TROPONIN I    Imaging Review Ct Head Wo Contrast  12/05/2013   CLINICAL DATA:  Recent traumatic injury with pain  EXAM: CT HEAD WITHOUT CONTRAST  CT CERVICAL SPINE WITHOUT CONTRAST  TECHNIQUE: Multidetector CT imaging of the head and cervical spine was performed following the standard protocol without intravenous contrast. Multiplanar CT image reconstructions of the cervical spine were also generated.  COMPARISON:  02/02/2013  FINDINGS: CT HEAD FINDINGS  The bony calvarium is intact. No gross soft tissue abnormality is noted. Atrophic changes and chronic white matter ischemic change are seen. There is calcification noted adjacent to the thalamus and basal ganglia on the left. This is stable from the prior exam.  CT CERVICAL SPINE FINDINGS  The examination is somewhat limited by patient motion artifact. Seven cervical segments are well visualized. Vertebral body height is well maintained. No acute fracture or facet abnormality is noted. No gross soft tissue abnormality is seen. Visualized lung apices are within normal limits.  IMPRESSION: CT head: Chronic changes without acute abnormality.  CT of the cervical spine: Significant motion artifact limits examination. No gross abnormality is seen.   Electronically Signed   By: Inez Catalina M.D.   On: 12/05/2013 10:48   Ct Cervical Spine Wo Contrast  12/05/2013   CLINICAL DATA:  Recent traumatic injury with pain  EXAM: CT HEAD WITHOUT CONTRAST  CT CERVICAL SPINE WITHOUT CONTRAST  TECHNIQUE: Multidetector CT imaging of the head and cervical spine was performed following the standard protocol without intravenous  contrast. Multiplanar CT image reconstructions of the cervical spine were also generated.  COMPARISON:  02/02/2013  FINDINGS: CT HEAD FINDINGS  The bony calvarium is intact. No gross soft tissue abnormality is noted. Atrophic changes and chronic white matter ischemic change are seen. There is calcification noted adjacent to the thalamus and basal ganglia on the left. This is stable from the prior exam.  CT CERVICAL SPINE FINDINGS  The examination is somewhat limited by patient motion artifact. Seven cervical segments are well visualized. Vertebral body height is well maintained. No acute fracture or facet abnormality is noted. No gross soft tissue abnormality is seen. Visualized lung apices are within normal limits.  IMPRESSION: CT head: Chronic changes without acute abnormality.  CT of the cervical spine: Significant motion  artifact limits examination. No gross abnormality is seen.   Electronically Signed   By: Inez Catalina M.D.   On: 12/05/2013 10:48   Dg Chest Portable 1 View  12/05/2013   CLINICAL DATA:  80 year old female status post fall on Plavix. Confusion. Initial encounter.  EXAM: PORTABLE CHEST - 1 VIEW  COMPARISON:  02/02/2013 and earlier.  FINDINGS: Portable AP view at 1000 hrs. Stable cardiac size and mediastinal contours. Tortuous thoracic aorta with calcified plaque. No pneumothorax. Allowing for portable technique, the lungs are clear. Visualized tracheal air column is within normal limits. Osteopenia. Multilevel treated lower thoracic/ upper lumbar compression fractures. Chronic right lateral rib fractures. No definite acute osseous injury.  IMPRESSION: No acute cardiopulmonary abnormality.   Electronically Signed   By: Lars Pinks M.D.   On: 12/05/2013 10:16     EKG Interpretation   Date/Time:  Monday December 05 2013 09:30:23 EDT Ventricular Rate:  109 PR Interval:  191 QRS Duration: 78 QT Interval:  353 QTC Calculation: 475 R Axis:   -8 Text Interpretation:  Sinus tachycardia Low  voltage, extremity and  precordial leads Minimal ST depression, anterolateral leads No significant  change was found Confirmed by Munira Polson  MD, Alhaji Mcneal ([id number]) on 12/05/2013  10:09:16 AM      MDM   Final diagnoses:  Altered mental status  Fall  Urinary tract infection    Contacting CT at this time for emergent head CT/  ([id number])  Patient be admitted the hospital for suspected urinary tract infection, confusion, generalized weakness.  Head CT without acute abnormality.  No gross abnormality noted in C-spine although poor imaging secondary to motion artifact.  At this time her laceration is closed.  I do not think he'll need stapling at this time as the blood itself should allow the wound to close appropriately  Hoy Morn, MD 12/05/13 1622

## 2013-12-05 NOTE — Progress Notes (Signed)
PT Cancellation Note  Patient Details Name: BRITNEY NEWSTROM MRN: [id number] DOB: 07-09-1914   Cancelled Treatment:    Reason Eval/Treat Not Completed: Patient not medically ready. RN reports pt to be kicking and combative and is also in the process of transferring pt to another floor. PT to return as able.    Alyxandria Wentz M Jenner Rosier 12/05/2013, 3:08 PM

## 2013-12-05 NOTE — ED Notes (Signed)
Attempted report 

## 2013-12-05 NOTE — ED Notes (Addendum)
Per EMS- Pt comes from home where she fell yesterday from a standing position, no LOC. Is taking plavix. Family reports today she is confused, c/o back pain which is chronic. Oxygen sats 88% for EMS, she would not tolerate nasal cannula. BP 190/110, Hr 100, CBG 158. At baseline pt is alert and oriented. Pt is restless in bed, wheezing noted. Is hard of hearing. 82% on RA upon arrival. Has laceration to back of her head. Bleeding controlled.

## 2013-12-05 NOTE — ED Notes (Signed)
c collar in place

## 2013-12-05 NOTE — ED Notes (Signed)
Pt moving around in CT, pulling at both IVs, which were wrapped in kerlex. Banging her arms on sit of bed. Pulled left forearm IV out, Right forearm IV bruising, bleeding around site noted. Site is wrapped.

## 2013-12-05 NOTE — ED Notes (Signed)
Family at bedside. 

## 2013-12-05 NOTE — ED Notes (Signed)
c-collar removed per MD, pt would not tolerate

## 2013-12-05 NOTE — Progress Notes (Signed)
Attempted report to 3W. Per unit secretary RN will call back to get report.

## 2013-12-05 NOTE — H&P (Addendum)
Patient Demographics  Holly Allen, is a 80 y.o. female  MRN: [id number]   DOB - 10/28/1914  Admit Date - 12/05/2013  Outpatient Primary MD for the patient is Woody Seller, MD   With History of -  Past Medical History  Diagnosis Date  . Hypertension   . TIA (transient ischemic attack)     history without residual deficits  . Leukocytosis   . Iron deficiency anemia   . COPD (chronic obstructive pulmonary disease)       Past Surgical History  Procedure Laterality Date  . Hip arthroplasty  03/17/2012    Procedure: ARTHROPLASTY BIPOLAR HIP;  Surgeon: Mcarthur Rossetti, MD;  Location: [facility];  Service: Orthopedics;  Laterality: Right;  . Ankle fracture surgery      in for   Chief Complaint  Patient presents with  . Fall     HPI  Holly Allen  is a 80 y.o. female, HTN, CVA, COPD, generalized deconditioning due to advanced age who lives at home with her son and was recently admitted for CVA is brought in after an episode of fall which happened yesterday, she hit the back of her head on the floor and sustained a small laceration which was treated at home, she was getting little more weak yesterday and continued to get more weak throughout today, per family she usually behaves this way when she developed a UTI, she was brought to the ER where her UA was suggestive of mild UTI, head and neck CT scan was nonacute, patient was noted to be somnolent and less responsive but was responding to noxious stimuli. She had no apparent focal deficits. I was called to admit the patient for toxic encephalopathy arising from UTI.    Review of Systems    Minimally responsive unable to provide review of systems   Social History History  Substance Use Topics  . Smoking status: Never Smoker   . Smokeless tobacco:  Never Used  . Alcohol Use: No      Family History Unobtainable  Prior to Admission medications   Medication Sig Start Date End Date Taking? Authorizing Provider  albuterol (PROVENTIL) (2.5 MG/3ML) 0.083% nebulizer solution Take 2.5 mg by nebulization 2 (two) times daily.   Yes Historical Provider, MD  cholecalciferol (VITAMIN D) 1000 UNITS tablet Take 1,000 Units by mouth daily.   Yes Historical Provider, MD  clopidogrel (PLAVIX) 75 MG tablet Take 75 mg by mouth daily with breakfast.   Yes Historical Provider, MD  HYDROcodone-acetaminophen (NORCO/VICODIN) 5-325 MG per tablet Take 0.5 tablets by mouth 2 (two) times daily as needed (pain).   Yes Historical Provider, MD  metoprolol (LOPRESSOR) 50 MG tablet Take 50 mg by mouth 2 (two) times daily.   Yes Historical Provider, MD    Allergies  Allergen Reactions  . Codeine     vomits  . Other Other (See Comments)    Muscle relaxant may have caused her stroke per family  Physical Exam  Vitals  Blood pressure 164/82, pulse 111, temperature 99.1 F (37.3 C), temperature source Temporal, resp. rate 24, SpO2 93.00%.   1. General frail elderly white female lying in bed in NAD, small laceration back of the scalp   2. Normal affect and insight, Not Suicidal or Homicidal, somnolent but responds to noxious stimuli and moves all 4 extremities  3. Limited exam as patient is minimally responsive unable to follow commands, Plantars down going.  4. Ears and Eyes appear Normal, Conjunctivae clear, PERRLA. Moist Oral Mucosa.  5. Supple Neck, No JVD, No cervical lymphadenopathy appriciated, No Carotid Bruits.  6. Symmetrical Chest wall movement, Good air movement bilaterally, CTAB.  7. RRR, No Gallops, Rubs or Murmurs, No Parasternal Heave.  8. Positive Bowel Sounds, Abdomen Soft, No tenderness, No organomegaly appriciated,No rebound -guarding or rigidity.  9.  No Cyanosis, Normal Skin Turgor, No Skin Rash or Bruise.  10. Good muscle  tone,  joints appear normal , no effusions, Normal ROM.  11. No Palpable Lymph Nodes in Neck or Axillae     Data Review  CBC  Recent Labs Lab 12/05/13 0945  WBC 9.9  HGB 13.8  HCT 42.6  PLT 163  MCV 82.9  MCH 26.8  MCHC 32.4  RDW 13.9   ------------------------------------------------------------------------------------------------------------------  Chemistries   Recent Labs Lab 12/05/13 0945  NA 132*  K 5.5*  CL 96  CO2 21  GLUCOSE 159*  BUN 17  CREATININE 0.65  CALCIUM 9.3   ------------------------------------------------------------------------------------------------------------------ CrCl is unknown because both a height and weight (above a minimum accepted value) are required for this calculation. ------------------------------------------------------------------------------------------------------------------ No results found for this basename: TSH, T4TOTAL, FREET3, T3FREE, THYROIDAB,  in the last 72 hours   Coagulation profile  Recent Labs Lab 12/05/13 1047  INR 1.10   ------------------------------------------------------------------------------------------------------------------- No results found for this basename: DDIMER,  in the last 72 hours -------------------------------------------------------------------------------------------------------------------  Cardiac Enzymes No results found for this basename: CK, CKMB, TROPONINI, MYOGLOBIN,  in the last 168 hours ------------------------------------------------------------------------------------------------------------------ No components found with this basename: POCBNP,    ---------------------------------------------------------------------------------------------------------------  Urinalysis    Component Value Date/Time   COLORURINE AMBER* 12/05/2013 1150   APPEARANCEUR CLOUDY* 12/05/2013 1150   LABSPEC 1.022 12/05/2013 1150   PHURINE 6.0 12/05/2013 1150   GLUCOSEU NEGATIVE 12/05/2013  1150   HGBUR LARGE* 12/05/2013 1150   BILIRUBINUR NEGATIVE 12/05/2013 1150   KETONESUR 15* 12/05/2013 1150   PROTEINUR 30* 12/05/2013 1150   UROBILINOGEN 0.2 12/05/2013 1150   NITRITE NEGATIVE 12/05/2013 [place] 12/05/2013 1150    ----------------------------------------------------------------------------------------------------------------  Imaging results:   Ct Head Wo Contrast  12/05/2013   CLINICAL DATA:  Recent traumatic injury with pain  EXAM: CT HEAD WITHOUT CONTRAST  CT CERVICAL SPINE WITHOUT CONTRAST  TECHNIQUE: Multidetector CT imaging of the head and cervical spine was performed following the standard protocol without intravenous contrast. Multiplanar CT image reconstructions of the cervical spine were also generated.  COMPARISON:  02/02/2013  FINDINGS: CT HEAD FINDINGS  The bony calvarium is intact. No gross soft tissue abnormality is noted. Atrophic changes and chronic white matter ischemic change are seen. There is calcification noted adjacent to the thalamus and basal ganglia on the left. This is stable from the prior exam.  CT CERVICAL SPINE FINDINGS  The examination is somewhat limited by patient motion artifact. Seven cervical segments are well visualized. Vertebral body height is well maintained. No acute fracture or facet abnormality is noted. No gross soft tissue abnormality is  seen. Visualized lung apices are within normal limits.  IMPRESSION: CT head: Chronic changes without acute abnormality.  CT of the cervical spine: Significant motion artifact limits examination. No gross abnormality is seen.   Electronically Signed   By: Inez Catalina M.D.   On: 12/05/2013 10:48   Ct Cervical Spine Wo Contrast  12/05/2013   CLINICAL DATA:  Recent traumatic injury with pain  EXAM: CT HEAD WITHOUT CONTRAST  CT CERVICAL SPINE WITHOUT CONTRAST  TECHNIQUE: Multidetector CT imaging of the head and cervical spine was performed following the standard protocol without intravenous contrast.  Multiplanar CT image reconstructions of the cervical spine were also generated.  COMPARISON:  02/02/2013  FINDINGS: CT HEAD FINDINGS  The bony calvarium is intact. No gross soft tissue abnormality is noted. Atrophic changes and chronic white matter ischemic change are seen. There is calcification noted adjacent to the thalamus and basal ganglia on the left. This is stable from the prior exam.  CT CERVICAL SPINE FINDINGS  The examination is somewhat limited by patient motion artifact. Seven cervical segments are well visualized. Vertebral body height is well maintained. No acute fracture or facet abnormality is noted. No gross soft tissue abnormality is seen. Visualized lung apices are within normal limits.  IMPRESSION: CT head: Chronic changes without acute abnormality.  CT of the cervical spine: Significant motion artifact limits examination. No gross abnormality is seen.   Electronically Signed   By: Inez Catalina M.D.   On: 12/05/2013 10:48   Dg Chest Portable 1 View  12/05/2013   CLINICAL DATA:  80 year old female status post fall on Plavix. Confusion. Initial encounter.  EXAM: PORTABLE CHEST - 1 VIEW  COMPARISON:  02/02/2013 and earlier.  FINDINGS: Portable AP view at 1000 hrs. Stable cardiac size and mediastinal contours. Tortuous thoracic aorta with calcified plaque. No pneumothorax. Allowing for portable technique, the lungs are clear. Visualized tracheal air column is within normal limits. Osteopenia. Multilevel treated lower thoracic/ upper lumbar compression fractures. Chronic right lateral rib fractures. No definite acute osseous injury.  IMPRESSION: No acute cardiopulmonary abnormality.   Electronically Signed   By: Lars Pinks M.D.   On: 12/05/2013 10:16    My personal review of EKG: Rhythm S.tach, Rate 109 /min, QTc 475 ms , non specific ST changes    Assessment & Plan    1. Toxic encephalopathy from UTI causing syncopal episode and fall yesterday - she will be admitted to a telemetry bed,  will monitor orthostatics, obtain urine culture, gentle hydration with IV fluids, once more responsive increase activity and have PT evaluate the patient. She might require placement.    2. Recent ischemic CVA. Was seen extensively by neurology that admission, echo and carotids were not repeated as options for therapy were limited due to her age, she was switched from aspirin to Plavix which will be continued, supportive care only. Not a candidate for anticoagulation or carotid artery surgery therefore will not repeat echo gram or carotid ultrasound.    3. Essential hypertension. Stable continue home dose Lopressor, upon arrival to the ER first set of blood pressure showed elevated blood pressure, could have been due to discomfort, as needed IV hydralazine will be ordered.     4. Mild dementia. At risk for delirium. Feeding assistance, aspiration and fall precautions, minimize narcotic and benzodiazepine use.    5. Mild hyperkalemia. Specimen is hemolyzed, potassium will be repeated.    6. Occipital scalp laceration. Local wound care with antibiotic cream.  DVT Prophylaxis  SCDs    AM Labs Ordered, also please review Full Orders  Family Communication: Admission, patients condition and plan of care including tests being ordered have been discussed with the patient and daughter Romie Minus who indicate understanding and agree with the plan and Code Status.  Code Status DNR  Likely DC to  TBD  Condition GUARDED   Time spent in minutes : 35    Thurnell Lose M.D on 12/05/2013 at 2:30 PM  Between 7am to 7pm - Pager - [phone]  After 7pm go to [email or web address] - password [email or web address]  And look for the night coverage person covering me after hours  [facility] Office  [phone]   **Disclaimer: This note may have been dictated with voice recognition software. Similar sounding words can inadvertently be transcribed and this note may contain transcription errors which  may not have been corrected upon publication of note.**

## 2013-12-05 NOTE — ED Notes (Signed)
Admitting MD at bedside.

## 2013-12-06 ENCOUNTER — Inpatient Hospital Stay (HOSPITAL_COMMUNITY): Payer: Medicare Other

## 2013-12-06 DIAGNOSIS — R319 Hematuria, unspecified: Secondary | ICD-10-CM

## 2013-12-06 DIAGNOSIS — J9601 Acute respiratory failure with hypoxia: Secondary | ICD-10-CM

## 2013-12-06 DIAGNOSIS — J441 Chronic obstructive pulmonary disease with (acute) exacerbation: Secondary | ICD-10-CM

## 2013-12-06 DIAGNOSIS — I6350 Cerebral infarction due to unspecified occlusion or stenosis of unspecified cerebral artery: Secondary | ICD-10-CM

## 2013-12-06 LAB — BASIC METABOLIC PANEL
BUN: 19 mg/dL (ref 6–23)
CHLORIDE: 96 meq/L (ref 96–112)
CO2: 22 mEq/L (ref 19–32)
Calcium: 9.1 mg/dL (ref 8.4–10.5)
Creatinine, Ser: 0.83 mg/dL (ref 0.50–1.10)
GFR calc non Af Amer: 58 mL/min — ABNORMAL LOW (ref >90)
GFR, EST AFRICAN AMERICAN: 67 mL/min — AB (ref >90)
Glucose, Bld: 123 mg/dL — ABNORMAL HIGH (ref 70–99)
Potassium: 4.2 mEq/L (ref 3.7–5.3)
Sodium: 133 mEq/L — ABNORMAL LOW (ref 137–147)

## 2013-12-06 LAB — CBC
HEMATOCRIT: 36.9 % (ref 36.0–46.0)
Hemoglobin: 12 g/dL (ref 12.0–15.0)
MCH: 26.5 pg (ref 26.0–34.0)
MCHC: 32.5 g/dL (ref 30.0–36.0)
MCV: 81.5 fL (ref 78.0–100.0)
Platelets: 153 10*3/uL (ref 150–400)
RBC: 4.53 MIL/uL (ref 3.87–5.11)
RDW: 14.1 % (ref 11.5–15.5)
WBC: 8.8 10*3/uL (ref 4.0–10.5)

## 2013-12-06 LAB — HEPATIC FUNCTION PANEL
ALBUMIN: 3 g/dL — AB (ref 3.5–5.2)
ALK PHOS: 94 U/L (ref 39–117)
ALT: 9 U/L (ref 0–35)
AST: 20 U/L (ref 0–37)
BILIRUBIN TOTAL: 1.2 mg/dL (ref 0.3–1.2)
Bilirubin, Direct: 0.2 mg/dL (ref 0.0–0.3)
Indirect Bilirubin: 1 mg/dL — ABNORMAL HIGH (ref 0.3–0.9)
Total Protein: 6.2 g/dL (ref 6.0–8.3)

## 2013-12-06 LAB — LIPASE, BLOOD: Lipase: 8 U/L — ABNORMAL LOW (ref 11–59)

## 2013-12-06 LAB — TROPONIN I: Troponin I: <0.3 ng/mL (ref <0.30)

## 2013-12-06 MED ORDER — BISACODYL 10 MG RE SUPP: 10.0000 mg | Freq: Every day | RECTAL | Status: DC | PRN

## 2013-12-06 MED ORDER — METHYLPREDNISOLONE SODIUM SUCC 40 MG IJ SOLR
40.0000 mg | Freq: Two times a day (BID) | INTRAMUSCULAR | Status: DC
  Administered 2013-12-06 – 2013-12-08 (×4): 40 mg via INTRAVENOUS
  Filled 2013-12-06 (×6): qty 1

## 2013-12-06 MED ORDER — ASPIRIN 300 MG RE SUPP
300.0000 mg | Freq: Every day | RECTAL | Status: DC
  Administered 2013-12-06: 300 mg via RECTAL
  Filled 2013-12-06 (×3): qty 1

## 2013-12-06 MED ORDER — METOPROLOL TARTRATE 1 MG/ML IV SOLN
5.0000 mg | Freq: Four times a day (QID) | INTRAVENOUS | Status: DC
  Administered 2013-12-06 – 2013-12-07 (×3): 5 mg via INTRAVENOUS
  Filled 2013-12-06 (×7): qty 5

## 2013-12-06 MED ORDER — MORPHINE SULFATE 2 MG/ML IJ SOLN: 0.5000 mg | INTRAMUSCULAR | Status: DC | PRN

## 2013-12-06 NOTE — Progress Notes (Signed)
Utilization review completed.  

## 2013-12-06 NOTE — Progress Notes (Signed)
Clinical Social Work Department BRIEF PSYCHOSOCIAL ASSESSMENT 12/06/2013  Patient:  Holly Allen, Holly Allen     Account Number:  [id number]     Admit date:  12/05/2013  Clinical Social Worker:  Adair Laundry  Date/Time:  12/06/2013 12:00 N  Referred by:  Physician  Date Referred:  12/06/2013 Referred for  SNF Placement   Other Referral:   Interview type:  Family Other interview type:   Spoke with pt children at bedside    PSYCHOSOCIAL DATA Living Status:  [place] Admitted from facility:   Level of care:   Primary support name:  Holly Allen [phone] Primary support relationship to patient:  CHILD, ADULT Degree of support available:   Pt has very good support system    CURRENT CONCERNS Current Concerns  Post-Acute Placement   Other Concerns:    SOCIAL WORK ASSESSMENT / PLAN CSW aware of PT recommendation for SNF. CSW visited pt room and pt down for procedure. However, pt three children all present at bedside. CSW discussed recommendation with family and they informed CSW they were are and understanding. CSW was informed that pt has been to rehab twice before and all three children are familiar with SNF process and have no questions or concerns. During both ST rehab visited before pt was at [facility] is requesting pt return there when medically stable. CSW did notify family that pt will need 3 night in patient stay for Medicare to be payor source for SNF. Family aware of Medicare guidelines and have no worries at this time. Pt family very pleasant and cooperative.   Assessment/plan status:  Psychosocial Support/Ongoing Assessment of Needs Other assessment/ plan:   Information/referral to community resources:   SNF list denied    PATIENT'S/FAMILY'S RESPONSE TO PLAN OF CARE: Pt family all in agreement that SNF will be best option for pt at [facility], [place]

## 2013-12-06 NOTE — Progress Notes (Addendum)
[facility] PROGRESS NOTE  [name]:[id number] DOB: 08-Mar-1915 DOA: 12/05/2013 PCP: Woody Seller, MD  Brief Summary  Holly Allen is a 80 y.o. female, HTN, CVA, COPD, generalized deconditioning due to advanced age who lives at home with her son and was recently admitted for CVA is brought in after an episode of fall which happened day prior to admission.  Over the last two weeks, she has become progressively more SOB.  The day prior to admission, shefell and hit the back of her head on the floor and sustained a small laceration which was treated at home.  She continued to have increased WOB and became so weak, she was unable to flush the toilet.  Overnight, she became more confused and was incontinent in bed, which never happens.  She was in moderate to severe respiratory distress in the ER.  CXR demonstrated emphysema.  UA with hematuria.  She complained of abdominal pain.  She was treated with lasix and duonebs and antibiotics initially.  Steroids have been added and CT abd/pelvis to eval her RUQ pain and to look for kidney stones was unremarkable.  She is very ill-appearing and family is aware that she is very sick.  She is DNR.    Assessment/Plan  Acute hypoxic respiratory failure secondary to possible acute diastolic heart failure versus acute COPD exacerbation, chest x-ray did not show evidence of vascular congestion but does show evidence of emphysema.  Wheezing improved some with Lasix last night and with nebulizer treatments overnight. -  Start solumedrol -  Continue nebulizer treatments -  Antibiotics as below -  Wean oxygen as tolerated -  Judicious use of IV fluids   Toxic encephalopathy from possible UTI/hypoxia, patient too lethargic to eat or take oral medications -  Oral care -  N.p.o. until more awake, and in speech evaluation -  Aspiration precautions  Right upper quadrant abdominal pain, may be related to urinary tract infection however her urinalysis  did not show white blood cells but copious red blood cells suggesting kidney stone. The patient also still has her gallbladder. -  Liver function tests, lipase -  Followup urine culture -  CT scan of the abdomen and pelvis without contrast to evaluate for kidney stones, gallbladder distention  Possible UTI -  Continue ceftriaxone -  Urine culture pending  Recent ischemic CVA. Was seen extensively by neurology that admission, echo and carotids were not repeated as options for therapy were limited due to her age, she was switched from aspirin to Plavix -  Hold plavix -  Rectal aspirin daily for now until tolerating PO  Essential hypertension -  Patient unable to tolerate by mouth at this time  -  Schedule metoprolol IV every 6 hours   Mild dementia. At risk for delirium.  - Feeding assistance, aspiration and fall precautions, minimize narcotic and benzodiazepine use.   Occipital scalp laceration. Local wound care with antibiotic cream.   Diet:  N.p.o. Access:  PIV IVF:  Off  Proph:  SCDs  Code Status: DNR Family Communication: Patient, her son, her daughter Disposition Plan: possibly to SNF in a few days, patient very ill   Consultants:  none  Procedures:  CXR  CT cervical spine  CT head  Repeat CXT  Antibiotics:  Ceftriaxone 6/8 >>  HPI/Subjective:  Patient awoke, and was able to communicate with difficulty. She endorsed some difficulty breathing. She denied pain, nausea.  Objective: Filed Vitals:   12/05/13 2055 12/05/13 2213 12/06/13 0129 12/06/13 6387  BP: 135/98 135/79 99/42 142/66  Pulse: 135 111  109  Temp:  97.7 F (36.5 C) 98.4 F (36.9 C) 98.1 F (36.7 C)  TempSrc:  Axillary Axillary Axillary  Resp: 28 24 22 22   Weight:    46.176 kg (101 lb 12.8 oz)  SpO2: 95% 95% 96% 97%    Intake/Output Summary (Last 24 hours) at 12/06/13 1015 Last data filed at 12/05/13 1700  Gross per 24 hour  Intake      0 ml  Output      8 ml  Net     -8 ml    Filed Weights   12/06/13 0617  Weight: 46.176 kg (101 lb 12.8 oz)    Exam:   General:  Cachectic Caucasian female, mild to moderate respiratory distress  HEENT:  NCAT, MMM  Cardiovascular:  Tachycardic, RR, nl S1, S2 no mrg, 2+ pulses, warm extremities  Respiratory:  Full expiratory wheeze throughout, diminished throughout, no focal rales or rhonchi, SEM, intercostal, subcostal retractions  Abdomen:   Hyperactive BS, soft, nondistended, tender to palpation in the right upper quadrant with questionable positive Murphy sign   MSK:   Decreased tone and bulk, no LEE  Neuro:  Some stiffness of the left upper extremity, was able to wiggle toes on bilateral lower extremities but unable to lift legs from bed, had some voluntary movement of the right upper extremity, no obvious facial droop  Data Reviewed: Basic Metabolic Panel:  Recent Labs Lab 12/05/13 0945 12/05/13 1354 12/06/13 0505  NA 132*  --  133*  K 5.5* 4.5 4.2  CL 96  --  96  CO2 21  --  22  GLUCOSE 159*  --  123*  BUN 17  --  19  CREATININE 0.65  --  0.83  CALCIUM 9.3  --  9.1   Liver Function Tests: No results found for this basename: AST, ALT, ALKPHOS, BILITOT, PROT, ALBUMIN,  in the last 168 hours No results found for this basename: LIPASE, AMYLASE,  in the last 168 hours No results found for this basename: AMMONIA,  in the last 168 hours CBC:  Recent Labs Lab 12/05/13 0945 12/06/13 0505  WBC 9.9 8.8  HGB 13.8 12.0  HCT 42.6 36.9  MCV 82.9 81.5  PLT 163 153   Cardiac Enzymes:  Recent Labs Lab 12/05/13 1605 12/05/13 2007 12/06/13 0230  TROPONINI <0.30 <0.30 <0.30   BNP (last 3 results) No results found for this basename: PROBNP,  in the last 8760 hours CBG: No results found for this basename: GLUCAP,  in the last 168 hours  No results found for this or any previous visit (from the past 240 hour(s)).   Studies: Ct Head Wo Contrast  12/05/2013   CLINICAL DATA:  Recent traumatic injury  with pain  EXAM: CT HEAD WITHOUT CONTRAST  CT CERVICAL SPINE WITHOUT CONTRAST  TECHNIQUE: Multidetector CT imaging of the head and cervical spine was performed following the standard protocol without intravenous contrast. Multiplanar CT image reconstructions of the cervical spine were also generated.  COMPARISON:  02/02/2013  FINDINGS: CT HEAD FINDINGS  The bony calvarium is intact. No gross soft tissue abnormality is noted. Atrophic changes and chronic white matter ischemic change are seen. There is calcification noted adjacent to the thalamus and basal ganglia on the left. This is stable from the prior exam.  CT CERVICAL SPINE FINDINGS  The examination is somewhat limited by patient motion artifact. Seven cervical segments are well visualized. Vertebral body height  is well maintained. No acute fracture or facet abnormality is noted. No gross soft tissue abnormality is seen. Visualized lung apices are within normal limits.  IMPRESSION: CT head: Chronic changes without acute abnormality.  CT of the cervical spine: Significant motion artifact limits examination. No gross abnormality is seen.   Electronically Signed   By: Inez Catalina M.D.   On: 12/05/2013 10:48   Ct Cervical Spine Wo Contrast  12/05/2013   CLINICAL DATA:  Recent traumatic injury with pain  EXAM: CT HEAD WITHOUT CONTRAST  CT CERVICAL SPINE WITHOUT CONTRAST  TECHNIQUE: Multidetector CT imaging of the head and cervical spine was performed following the standard protocol without intravenous contrast. Multiplanar CT image reconstructions of the cervical spine were also generated.  COMPARISON:  02/02/2013  FINDINGS: CT HEAD FINDINGS  The bony calvarium is intact. No gross soft tissue abnormality is noted. Atrophic changes and chronic white matter ischemic change are seen. There is calcification noted adjacent to the thalamus and basal ganglia on the left. This is stable from the prior exam.  CT CERVICAL SPINE FINDINGS  The examination is somewhat limited  by patient motion artifact. Seven cervical segments are well visualized. Vertebral body height is well maintained. No acute fracture or facet abnormality is noted. No gross soft tissue abnormality is seen. Visualized lung apices are within normal limits.  IMPRESSION: CT head: Chronic changes without acute abnormality.  CT of the cervical spine: Significant motion artifact limits examination. No gross abnormality is seen.   Electronically Signed   By: Inez Catalina M.D.   On: 12/05/2013 10:48   Dg Chest Port 1 View  12/05/2013   CLINICAL DATA:  Increasing shortness of breath  EXAM: PORTABLE CHEST - 1 VIEW  COMPARISON:  12/05/2013 at 1000 hrs  FINDINGS: Chronic interstitial markings/emphysematous changes. No focal consolidation. No pleural effusion or pneumothorax.  The heart is normal in size. Mild perihilar prominence, chronic, likely vascular.  IMPRESSION: No evidence of acute cardiopulmonary disease.   Electronically Signed   By: Julian Hy M.D.   On: 12/05/2013 18:55   Dg Chest Portable 1 View  12/05/2013   CLINICAL DATA:  80 year old female status post fall on Plavix. Confusion. Initial encounter.  EXAM: PORTABLE CHEST - 1 VIEW  COMPARISON:  02/02/2013 and earlier.  FINDINGS: Portable AP view at 1000 hrs. Stable cardiac size and mediastinal contours. Tortuous thoracic aorta with calcified plaque. No pneumothorax. Allowing for portable technique, the lungs are clear. Visualized tracheal air column is within normal limits. Osteopenia. Multilevel treated lower thoracic/ upper lumbar compression fractures. Chronic right lateral rib fractures. No definite acute osseous injury.  IMPRESSION: No acute cardiopulmonary abnormality.   Electronically Signed   By: Lars Pinks M.D.   On: 12/05/2013 10:16    Scheduled Meds: . cefTRIAXone (ROCEPHIN)  IV  1 g Intravenous Q24H  . cholecalciferol  1,000 Units Oral Daily  . clopidogrel  75 mg Oral Q breakfast  . neomycin-bacitracin-polymyxin   Topical 3 times per day   . neomycin-polymyxin-pramoxine   Topical Q8H  . sodium chloride  3 mL Intravenous Q12H   Continuous Infusions:   Principal Problem:   Acute encephalopathy Active Problems:   Hypertension   Hx of stroke without residual deficits   Anemia   COPD (chronic obstructive pulmonary disease)   Mild cognitive impairment, so stated   Syncope   Altered mental status    Time spent: 30 min    [facility] Pager [phone]. If 7PM-7AM, please  contact night-coverage at [email or web address], password [facility] 12/06/2013, 10:15 AM  LOS: 1 day

## 2013-12-06 NOTE — Evaluation (Signed)
Physical Therapy Evaluation Patient Details Name: Holly Allen MRN: [id number] DOB: 03-21-1915 Today's Date: 12/06/2013   History of Present Illness  Pt admitted after fall at home with progressive weakness and aMS  Clinical Impression  Pt without response to questions or commands today despite son present attempting to engage pt and bil hearing aids in place. Pt was walking and doing basic ADLs for herself 2 days prior with son noting significant decline since then. Pt typically carries on a conversation and follows commands. Today pt not visually tracking slight groan x1 during session and otherwise no response. Pt sat EOB 4 min with total assist with tendency toward right lean. EOB pt did open mouth x 1 on command for suctioning. Pt will benefit from acute therapy to maximize mobility, strength, function and transfers as pt able to participate over 1wk trial period.     Follow Up Recommendations SNF;Supervision/Assistance - 24 hour    Equipment Recommendations  None recommended by PT    Recommendations for Other Services       Precautions / Restrictions Precautions Precautions: Fall Restrictions Weight Bearing Restrictions: No      Mobility  Bed Mobility Overal bed mobility: +2 for physical assistance;Needs Assistance Bed Mobility: Rolling;Sidelying to Sit;Sit to Sidelying Rolling: Total assist Sidelying to sit: Total assist     Sit to sidelying: Total assist General bed mobility comments: pt total assist to roll bil in bed, scoot to HOb and for all transfers. +2 for pericare and linen change in sidely due to urine incontinence  Transfers                    Ambulation/Gait                Stairs            Wheelchair Mobility    Modified Rankin (Stroke Patients Only)       Balance Overall balance assessment: Needs assistance   Sitting balance-Leahy Scale: Zero                                       Pertinent Vitals/Pain  PAINAD= 2 On 3L throughout    Home Living Family/patient expects to be discharged to:: Skilled nursing facility Living Arrangements: Children                    Prior Function Level of Independence: Needs assistance   Gait / Transfers Assistance Needed: mod I with RW  ADL's / Homemaking Assistance Needed: dgtr was assisting with bathing and family does household chores and cooking  Comments: pt was walking and doing basic ADLs for herself. LIved with son who was home most of the time     Hand Dominance        Extremity/Trunk Assessment   Upper Extremity Assessment: Generalized weakness;Difficult to assess due to impaired cognition           Lower Extremity Assessment: Generalized weakness;Difficult to assess due to impaired cognition      Cervical / Trunk Assessment: Kyphotic  Communication   Communication: HOH (wears bil hearing aids)  Cognition Arousal/Alertness: Lethargic Behavior During Therapy: Flat affect Overall Cognitive Status: Impaired/Different from baseline Area of Impairment: Attention   Current Attention Level: Focused           General Comments: pt with eyes open, not tracking, not responding to commands    General  Comments      Exercises        Assessment/Plan    PT Assessment Patient needs continued PT services  PT Diagnosis Generalized weakness;Altered mental status   PT Problem List Decreased strength;Decreased cognition;Decreased range of motion;Decreased activity tolerance;Decreased balance;Decreased mobility  PT Treatment Interventions Functional mobility training;Therapeutic activities;Therapeutic exercise;Patient/family education;Balance training;Cognitive remediation   PT Goals (Current goals can be found in the Care Plan section) Acute Rehab PT Goals Patient Stated Goal: for pt to be able to move per son PT Goal Formulation: With family Time For Goal Achievement: 12/20/13 Potential to Achieve Goals: Poor     Frequency Min 2X/week (trial)   Barriers to discharge Decreased caregiver support      Co-evaluation               End of Session   Activity Tolerance: Patient limited by lethargy Patient left: in bed;with call bell/phone within reach;with family/visitor present Nurse Communication: Mobility status    Functional Assessment Tool Used: clinical judgement Functional Limitation: Mobility: Walking and moving around Mobility: Walking and Moving Around Current Status (E6754): 100 percent impaired, limited or restricted Mobility: Walking and Moving Around Goal Status (G9201): At least 40 percent but less than 60 percent impaired, limited or restricted    Time: 0941-1002 PT Time Calculation (min): 21 min   Charges:   PT Evaluation $Initial PT Evaluation Tier I: 1 Procedure PT Treatments $Therapeutic Activity: 8-22 mins   PT G Codes:   Functional Assessment Tool Used: clinical judgement Functional Limitation: Mobility: Walking and moving around    [place] 12/06/2013, 10:10 AM Elwyn Reach, [place]

## 2013-12-06 NOTE — Progress Notes (Addendum)
Clinical Social Work Department CLINICAL SOCIAL WORK PLACEMENT NOTE 12/06/2013  Patient:  BLESSING, ZAUCHA  Account Number:  [id number] Admit date:  12/05/2013  Clinical Social Worker:  Berton Mount, Latanya Presser  Date/time:  12/06/2013 12:13 PM  Clinical Social Work is seeking post-discharge placement for this patient at the following level of care:   SKILLED NURSING   (*CSW will update this form in Epic as items are completed)   12/06/2013  Patient/family provided with [facility] Department of Clinical Social Work's list of facilities offering this level of care within the geographic area requested by the patient (or if unable, by the patient's family).  12/06/2013  Patient/family informed of their freedom to choose among providers that offer the needed level of care, that participate in Medicare, Medicaid or managed care program needed by the patient, have an available bed and are willing to accept the patient.  12/06/2013  Patient/family informed of [facility]' ownership interest in [facility], as well as of the fact that they are under no obligation to receive care at this facility.  PASARR submitted to EDS on Existing PASARR number received on   FL2 transmitted to all facilities in geographic area requested by pt/family on  12/06/2013 FL2 transmitted to all facilities within larger geographic area on   Patient informed that his/her managed care company has contracts with or will negotiate with  certain facilities, including the following:     Patient/family informed of bed offers received:  12/07/2013 Patient chooses bed at [facility] Physician recommends and patient chooses bed at    Patient to be transferred to [facility]  on  12/09/2013 Patient to be transferred to facility by [facility] Patient and family notified of transfer on 12/09/2013 Name of family member notified:  Felecia Shelling  The following physician request were entered in Epic:   Additional  Comments:   [place], [place]

## 2013-12-07 DIAGNOSIS — E43 Unspecified severe protein-calorie malnutrition: Secondary | ICD-10-CM | POA: Insufficient documentation

## 2013-12-07 DIAGNOSIS — J9600 Acute respiratory failure, unspecified whether with hypoxia or hypercapnia: Secondary | ICD-10-CM

## 2013-12-07 LAB — CBC
HEMATOCRIT: 34.5 % — AB (ref 36.0–46.0)
Hemoglobin: 10.9 g/dL — ABNORMAL LOW (ref 12.0–15.0)
MCH: 26.1 pg (ref 26.0–34.0)
MCHC: 31.6 g/dL (ref 30.0–36.0)
MCV: 82.7 fL (ref 78.0–100.0)
Platelets: 146 10*3/uL — ABNORMAL LOW (ref 150–400)
RBC: 4.17 MIL/uL (ref 3.87–5.11)
RDW: 14.3 % (ref 11.5–15.5)
WBC: 4.6 10*3/uL (ref 4.0–10.5)

## 2013-12-07 LAB — BASIC METABOLIC PANEL
BUN: 34 mg/dL — AB (ref 6–23)
CHLORIDE: 94 meq/L — AB (ref 96–112)
CO2: 21 meq/L (ref 19–32)
Calcium: 9.1 mg/dL (ref 8.4–10.5)
Creatinine, Ser: 0.78 mg/dL (ref 0.50–1.10)
GFR calc Af Amer: 79 mL/min — ABNORMAL LOW (ref >90)
GFR calc non Af Amer: 68 mL/min — ABNORMAL LOW (ref >90)
GLUCOSE: 154 mg/dL — AB (ref 70–99)
POTASSIUM: 4.6 meq/L (ref 3.7–5.3)
Sodium: 142 mEq/L (ref 137–147)

## 2013-12-07 MED ORDER — CLOPIDOGREL BISULFATE 75 MG PO TABS
75.0000 mg | ORAL_TABLET | Freq: Every day | ORAL | Status: DC
  Administered 2013-12-08 – 2013-12-09 (×2): 75 mg via ORAL
  Filled 2013-12-07 (×2): qty 1

## 2013-12-07 MED ORDER — METOPROLOL TARTRATE 50 MG PO TABS
50.0000 mg | ORAL_TABLET | Freq: Two times a day (BID) | ORAL | Status: DC
  Administered 2013-12-07 – 2013-12-09 (×4): 50 mg via ORAL
  Filled 2013-12-07 (×5): qty 1

## 2013-12-07 MED ORDER — RESOURCE THICKENUP CLEAR PO POWD
ORAL | Status: DC | PRN
  Filled 2013-12-07: qty 125

## 2013-12-07 MED ORDER — LABETALOL HCL 5 MG/ML IV SOLN: 5.0000 mg | INTRAVENOUS | Status: DC | PRN

## 2013-12-07 NOTE — Clinical Documentation Improvement (Signed)
Possible Clinical Conditions?    ___Dehydration ___Other Condition ___Cannot Clinically Determine     Risk Factors: Monitor orthostatics, gentle hydration with IVF's per 6/08 progress notes. Patient too lethargic to eat or take oral meds.   Thank You, Theron Arista, Clinical Documentation Specialist:  [phone]  [facility] Information Management

## 2013-12-07 NOTE — Evaluation (Signed)
Clinical/Bedside Swallow Evaluation Patient Details  Name: Holly Allen MRN: [id number] Date of Birth: [birth year]  Today's Date: 12/07/2013 Time: 4098-1191 SLP Time Calculation (min): 27 min  Past Medical History:  Past Medical History  Diagnosis Date  . Hypertension   . TIA (transient ischemic attack)     history without residual deficits  . Leukocytosis   . Iron deficiency anemia   . COPD (chronic obstructive pulmonary disease)   . Pneumonia     "couple times" (12/05/2013)  . History of blood transfusion 2013    "low blood pressure after hip OR"  . Pernicious anemia     hx; "did B12 shots q month for years; must have outgrew it"  . Scarlet fever     "as a child"  . Stroke X 2-3    "didn't leave her w/any permanent problems that we know of" (12/05/2013)  . Chronic lower back pain   . UTI (lower urinary tract infection) 01/2013; 12/05/2013    "severe";   . Cancer 1949    "took al the fat out of the inside of one of her legs"   Past Surgical History:  Past Surgical History  Procedure Laterality Date  . Hip arthroplasty  03/17/2012    Procedure: ARTHROPLASTY BIPOLAR HIP;  Surgeon: Mcarthur Rossetti, MD;  Location: [facility];  Service: Orthopedics;  Laterality: Right;  . Ankle fracture surgery    . Appendectomy    . Cataract extraction w/ intraocular lens  implant, bilateral Bilateral   . Kyphoplasty  X 3  . Back surgery    . Lumbar disc surgery  X 2  . Abdominal hysterectomy  07/1971   HPI:  80 y.o. female, HTN, CVA, COPD, generalized deconditioning due to advanced age who lives at home with her son and was recently admitted for CVA is brought in after an episode of fall which happened day prior to admission. Pt has a h/o dysphagia requiring nectar thick liquids after recent CVA, although daughter reports that she had been advanced to thin liquids since d/c.   Assessment / Plan / Recommendation Clinical Impression  Pt presents with intermittent throat clearing at baseline,  which is noted intermittently throughout PO intake as well. An immediate throat clear with extra, audible swallows was noted consistently with thin liquids, although was minimized with solids and nectar thick liquids. Recommend to initiate Dys 3 textures and nectar thick liquids with close supervision as discussed with RN. SLP to follow to assess tolerance.     Aspiration Risk  Moderate    Diet Recommendation Dysphagia 3 (Mechanical Soft);Nectar-thick liquid   Liquid Administration via: Cup;Straw Medication Administration: Whole meds with puree Supervision: Patient able to self feed;Full supervision/cueing for compensatory strategies Compensations: Slow rate;Small sips/bites Postural Changes and/or Swallow Maneuvers: Seated upright 90 degrees;Upright 30-60 min after meal    Other  Recommendations Oral Care Recommendations: Oral care BID Other Recommendations: Order thickener from pharmacy;Prohibited food (jello, ice cream, thin soups);Remove water pitcher   Follow Up Recommendations  24 hour supervision/assistance;Skilled Nursing facility    Frequency and Duration min 2x/week  2 weeks   Pertinent Vitals/Pain N/A    SLP Swallow Goals     Swallow Study Prior Functional Status       General Date of Onset: 12/05/13 HPI: 80 y.o. female, HTN, CVA, COPD, generalized deconditioning due to advanced age who lives at home with her son and was recently admitted for CVA is brought in after an episode of fall which happened day prior  to admission. Pt has a h/o dysphagia requiring nectar thick liquids after recent CVA, although daughter reports that she had been advanced to thin liquids since d/c. Type of Study: Bedside swallow evaluation Previous Swallow Assessment: see HPI (no objective swallow evaluation documented or reported) Diet Prior to this Study: Dysphagia 3 (soft);Thin liquids Temperature Spikes Noted: No Respiratory Status: Nasal cannula (3L) History of Recent Intubation:  No Behavior/Cognition: Alert;Cooperative;Pleasant mood;Requires cueing Oral Cavity - Dentition: Missing dentition Self-Feeding Abilities: Able to feed self;Needs assist Patient Positioning: Upright in bed Baseline Vocal Quality: Clear Volitional Cough: Weak Volitional Swallow: Unable to elicit    Oral/Motor/Sensory Function     Ice Chips Ice chips: Within functional limits Presentation: Spoon   Thin Liquid Thin Liquid: Impaired Presentation: Cup;Self Fed;Straw Pharyngeal  Phase Impairments: Suspected delayed Swallow;Throat Clearing - Immediate;Cough - Delayed    Nectar Thick Nectar Thick Liquid: Impaired Presentation: Cup;Self Fed Pharyngeal Phase Impairments: Suspected delayed Swallow;Throat Clearing - Delayed   Honey Thick Honey Thick Liquid: Not tested   Puree Puree: Impaired Presentation: Self Fed;Spoon Pharyngeal Phase Impairments: Suspected delayed Swallow;Throat Clearing - Delayed   Solid   GO    Solid: Impaired Presentation: Self Fed Oral Phase Impairments:  (slow mastication likely due to missing dentition) Pharyngeal Phase Impairments: Throat Clearing - Delayed       Germain Osgood, M.A. CCC-SLP [phone]  Germain Osgood 12/07/2013,4:11 PM

## 2013-12-07 NOTE — Progress Notes (Signed)
[facility] PROGRESS NOTE  WALTERINE AMODEI IFO:[id number] DOB: [birth year] DOA: 12/05/2013 PCP: Woody Seller, MD  Brief Summary  Holly Allen is a 80 y.o. female, HTN, CVA, COPD, generalized deconditioning due to advanced age who lives at home with her son and was recently admitted for CVA is brought in after an episode of fall which happened day prior to admission.  Over the last two weeks, she has become progressively more SOB.  The day prior to admission, shefell and hit the back of her head on the floor and sustained a small laceration which was treated at home.  She continued to have increased WOB and became so weak, she was unable to flush the toilet.  Overnight, she became more confused and was incontinent in bed, which never happens.  She was in moderate to severe respiratory distress in the ER.  CXR demonstrated emphysema.  UA with hematuria.  She complained of abdominal pain.  She was treated with lasix and duonebs and antibiotics initially.  Steroids have been added and CT abd/pelvis to eval her RUQ pain and to look for kidney stones was unremarkable.  She is very ill-appearing and family is aware that she is very sick.  She is DNR.    Assessment/Plan  Acute hypoxic respiratory failure secondary to possible acute diastolic heart failure versus acute COPD exacerbation, chest x-ray did not show evidence of vascular congestion but does show evidence of emphysema.  -  Started solumedrol -  Continue nebulizer treatments -  Antibiotics as below -  Wean oxygen as tolerated -  Judicious use of IV fluids - Lungs clear this AM   Toxic encephalopathy from possible UTI/hypoxia, patient too lethargic to eat or take oral medications -  Oral care -  Was NPO until more awake -  Aspiration precautions - SLP consulted - Will try soft diet as pt is more awake  Right upper quadrant abdominal pain, may be related to urinary tract infection however her urinalysis did not show white blood cells  but copious red blood cells suggesting kidney stone. The patient also still has her gallbladder. -  Liver function tests, lipase -  Followup urine culture -  CT scan of the abdomen and pelvis without contrast to evaluate for kidney stones, gallbladder distention  Possible UTI -  Continued on ceftriaxone -  Urine culture pending  Recent ischemic CVA. Was seen extensively by neurology that admission, echo and carotids were not repeated as options for therapy were limited due to her age, she was switched from aspirin to Plavix -  Plavix was held secondary to lethargy -  Rectal aspirin daily until tolerating PO  Essential hypertension  -  Schedule metoprolol IV every 6 hours until pt can tolerate PO, then resume home meds  Mild dementia. At risk for delirium.  - Feeding assistance, aspiration and fall precautions, minimize narcotic and benzodiazepine use.   Occipital scalp laceration. Local wound care with antibiotic cream.  Severe protein calorie malnutrition - Nutrition following   Diet:  N.p.o. Access:  PIV IVF:  Off  Proph:  SCDs  Code Status: DNR Family Communication: Pt and family in room Disposition Plan: possibly to SNF in a few days, patient very ill   Consultants:  none  Procedures:  CXR  CT cervical spine  CT head  Repeat CXT  Antibiotics:  Ceftriaxone 6/8 >>  HPI/Subjective: No acute events noted. Appears improved per family in the room  Objective: Filed Vitals:   12/06/13 0617 12/06/13 1429 12/06/13  1945 12/07/13 0532  BP: 142/66 148/62 128/70 128/62  Pulse: 109 100 101 105  Temp: 98.1 F (36.7 C) 97.5 F (36.4 C) 98.6 F (37 C) 97.5 F (36.4 C)  TempSrc: Axillary Axillary Axillary Oral  Resp: 22 22 23 16   Weight: 101 lb 12.8 oz (46.176 kg)     SpO2: 97% 94% 98% 97%    Intake/Output Summary (Last 24 hours) at 12/07/13 1142 Last data filed at 12/07/13 0946  Gross per 24 hour  Intake      3 ml  Output      0 ml  Net      3 ml    Filed Weights   12/06/13 0617  Weight: 101 lb 12.8 oz (46.176 kg)    Exam:   General:  Cachectic Caucasian female, mild to moderate respiratory distress  HEENT:  NCAT, MMM  Cardiovascular:  Tachycardic, RR, nl S1, S2 no mrg, 2+ pulses, warm extremities  Respiratory:  Full expiratory wheeze throughout, diminished throughout, no focal rales or rhonchi, SEM, intercostal, subcostal retractions  Abdomen:   Hyperactive BS, soft, nondistended, tender to palpation in the right upper quadrant with questionable positive Murphy sign   MSK:   Decreased tone and bulk, no LEE  Neuro:  Some stiffness of the left upper extremity, was able to wiggle toes on bilateral lower extremities but unable to lift legs from bed, had some voluntary movement of the right upper extremity, no obvious facial droop  Data Reviewed: Basic Metabolic Panel:  Recent Labs Lab 12/05/13 0945 12/05/13 1354 12/06/13 0505 12/07/13 0507  NA 132*  --  133* 142  K 5.5* 4.5 4.2 4.6  CL 96  --  96 94*  CO2 21  --  22 21  GLUCOSE 159*  --  123* 154*  BUN 17  --  19 34*  CREATININE 0.65  --  0.83 0.78  CALCIUM 9.3  --  9.1 9.1   Liver Function Tests:  Recent Labs Lab 12/06/13 0505  AST 20  ALT 9  ALKPHOS 94  BILITOT 1.2  PROT 6.2  ALBUMIN 3.0*    Recent Labs Lab 12/06/13 0505  LIPASE 8*   No results found for this basename: AMMONIA,  in the last 168 hours CBC:  Recent Labs Lab 12/05/13 0945 12/06/13 0505 12/07/13 0507  WBC 9.9 8.8 4.6  HGB 13.8 12.0 10.9*  HCT 42.6 36.9 34.5*  MCV 82.9 81.5 82.7  PLT 163 153 146*   Cardiac Enzymes:  Recent Labs Lab 12/05/13 1605 12/05/13 2007 12/06/13 0230  TROPONINI <0.30 <0.30 <0.30   BNP (last 3 results) No results found for this basename: PROBNP,  in the last 8760 hours CBG: No results found for this basename: GLUCAP,  in the last 168 hours  No results found for this or any previous visit (from the past 240 hour(s)).   Studies: Ct Abdomen  Pelvis Wo Contrast  12/06/2013   CLINICAL DATA:  Right upper quadrant pain.  Hematuria.  EXAM: CT ABDOMEN AND PELVIS WITHOUT CONTRAST  TECHNIQUE: Multidetector CT imaging of the abdomen and pelvis was performed following the standard protocol without IV contrast.  COMPARISON:  Abdominal series 01/18/2013.  FINDINGS: Liver normal. Spleen normal. Pancreas normal. Gallbladder is nondistended. Mild prominence of the common bile duct at 7 mm. No evidence of obstructing biliary lesion.  Right adrenal 1 cm low-density lesion with a Hounsfield unit of 7, most likely tiny adrenal adenoma. Adrenals otherwise normal. 2.9 cm left renal cyst. Left parapelvic  cysts. Indeterminate approximately 1.3 cm low-density lesion posterior aspect of midportion of right kidney. Tiny nodular component within this lesion may be present. Triphasic CT can be obtained for further evaluation. No hydronephrosis or obstructing ureteral stone. Bladder is nondistended. Multiple pelvic phleboliths. The pelvis is difficult to evaluate due to streak artifact from right hip replacement and motion artifact. Hysterectomy. 2.2 cm left adnexal simple cyst. No free pelvic fluid.  No significant adenopathy. Diffuse aortic and visceral atherosclerotic vascular disease. Aortoiliac ectasia with maximum diameter of the abdominal aorta at 2.4 cm and maximum diameter of the common iliac arteries at 1.3 cm.  Appendectomy. No inflammatory changes in the right or left lower quadrant. Diverticulosis. No evidence of bowel obstruction or free air. Stomach is nondistended. Small sliding hiatal hernia is present. No mesenteric mass. Bilateral inguinal hernias with herniation of fat. Tiny umbilical hernia with herniation of fat.  Chronic interstitial lung disease. Coronary artery disease. Multiple right breast masses, most likely cysts. Correlation with mammography and and if necessary breast ultrasound suggested. Diffuse degenerative changes lumbar spine. Mobile  thoracolumbar and sacral vertebroplasties. Right hip replacement. Severe diffuse osteopenia.  IMPRESSION: 1. Indeterminate 1.3 cm low-density lesion posterior aspect midportion right kidney. A tiny nodular component may be present within this low-density lesion. Triphasic CT can be obtained for further evaluation. No hydronephrosis or evidence of obstructing calcified ureteral stone. 2. 2.9 cm simple left renal cyst.  Left renal parapelvic cysts. 3. 2.2 cm simple left adnexal cyst.  Hysterectomy. 4. Aortoiliac atherosclerotic vascular disease. 5. Chronic interstitial lung disease. 6. Coronary artery disease. 7. Multiple prominent right breast masses, most likely cysts. Correlation with mammography and if need be breast ultrasound. 8. Severe thoracolumbar lumbosacral degenerative change with multiple thoracolumbar and sacral vertebral plasties. Diffuse osteopenia .   Electronically Signed   By: Marcello Moores  Register   On: 12/06/2013 12:41   Dg Chest Port 1 View  12/05/2013   CLINICAL DATA:  Increasing shortness of breath  EXAM: PORTABLE CHEST - 1 VIEW  COMPARISON:  12/05/2013 at 1000 hrs  FINDINGS: Chronic interstitial markings/emphysematous changes. No focal consolidation. No pleural effusion or pneumothorax.  The heart is normal in size. Mild perihilar prominence, chronic, likely vascular.  IMPRESSION: No evidence of acute cardiopulmonary disease.   Electronically Signed   By: Julian Hy M.D.   On: 12/05/2013 18:55    Scheduled Meds: . aspirin  300 mg Rectal Daily  . cefTRIAXone (ROCEPHIN)  IV  1 g Intravenous Q24H  . methylPREDNISolone (SOLU-MEDROL) injection  40 mg Intravenous Q12H  . metoprolol  5 mg Intravenous 4 times per day  . neomycin-bacitracin-polymyxin   Topical 3 times per day  . sodium chloride  3 mL Intravenous Q12H   Continuous Infusions:   Principal Problem:   Acute encephalopathy Active Problems:   Hypertension   Hx of stroke without residual deficits   Anemia   COPD (chronic  obstructive pulmonary disease)   Mild cognitive impairment, so stated   Syncope   Altered mental status   Hematuria   Acute respiratory failure with hypoxia   COPD with acute exacerbation    Time spent: 30 min    Kataryna Mcquilkin, [facility] Pager [phone]. If 7PM-7AM, please contact night-coverage at [email or web address], password [facility] 12/07/2013, 11:42 AM  LOS: 2 days

## 2013-12-07 NOTE — Progress Notes (Signed)
INITIAL NUTRITION ASSESSMENT  DOCUMENTATION CODES Per approved criteria  -Severe malnutrition in the context of chronic illness   INTERVENTION:  Diet per SLP after swallow evaluation. No dietary restrictions recommended.  Recommend start PO supplement once cleared to safely take a PO diet.  NUTRITION DIAGNOSIS: Malnutrition related to chronic inadequate oral intake as evidenced by severe depletion of subcutaneous fat and muscle mass.   Goal: Intake to meet >90% of estimated nutrition needs.  Monitor:  PO intake, labs, weight trend.  Reason for Assessment: Low Braden  80 y.o. female  Admitting Dx: Acute encephalopathy  ASSESSMENT: 80 y.o. female, HTN, CVA, COPD, generalized deconditioning due to advanced age who lives at home with her son and was recently admitted for CVA is brought in after an episode of fall which happened day prior to admission.  Patient is underweight with BMI=17.5. Patient's family reports that patient eats very well at home. They are afraid to feed her because she hasn't eaten in 3 days. History of dysphagia after stroke in the past.  Nutrition Focused Physical Exam:  Subcutaneous Fat:  Orbital Region: severe depletion Upper Arm Region: severe depletion Thoracic and Lumbar Region: NA  Muscle:  Temple Region: severe depletion Clavicle Bone Region: severe depletion Clavicle and Acromion Bone Region: severe depletion Scapular Bone Region: NA Dorsal Hand: severe depletion Patellar Region: severe depletion Anterior Thigh Region: severe depletion Posterior Calf Region: severe depletion  Edema: none  Pt meets criteria for severe MALNUTRITION in the context of chronic illness as evidenced by severe depletion of subcutaneous fat and muscle mass.  Height: Ht Readings from Last 1 Encounters:  02/02/13 5\' 4"  (1.626 m)    Weight: Wt Readings from Last 1 Encounters:  12/06/13 101 lb 12.8 oz (46.176 kg)    Ideal Body Weight: 54.5 kg  % Ideal  Body Weight: 85%  Wt Readings from Last 10 Encounters:  12/06/13 101 lb 12.8 oz (46.176 kg)  02/02/13 101 lb 3.1 oz (45.9 kg)  01/18/13 105 lb (47.628 kg)  12/30/12 105 lb (47.628 kg)  12/13/12 107 lb (48.535 kg)  11/25/12 100 lb (45.36 kg)  03/17/12 115 lb (52.164 kg)  03/17/12 115 lb (52.164 kg)    Usual Body Weight: 107 lb (1 year ago)  % Usual Body Weight: 94%  BMI:  Body mass index is 17.47 kg/(m^2). Underweight  Estimated Nutritional Needs: Kcal: 1300-1500 Protein: 65-75 gm Fluid: 1.3-1.5 L  Skin: posterior head laceration  Diet Order: Criss Rosales  EDUCATION NEEDS: -Education needs addressed   Intake/Output Summary (Last 24 hours) at 12/07/13 1302 Last data filed at 12/07/13 0946  Gross per 24 hour  Intake      0 ml  Output      0 ml  Net      0 ml    Last BM: None documented since admission    Labs:   Recent Labs Lab 12/05/13 0945 12/05/13 1354 12/06/13 0505 12/07/13 0507  NA 132*  --  133* 142  K 5.5* 4.5 4.2 4.6  CL 96  --  96 94*  CO2 21  --  22 21  BUN 17  --  19 34*  CREATININE 0.65  --  0.83 0.78  CALCIUM 9.3  --  9.1 9.1  GLUCOSE 159*  --  123* 154*    CBG (last 3)  No results found for this basename: GLUCAP,  in the last 72 hours  Scheduled Meds: . aspirin  300 mg Rectal Daily  . cefTRIAXone (ROCEPHIN)  IV  1 g Intravenous Q24H  . methylPREDNISolone (SOLU-MEDROL) injection  40 mg Intravenous Q12H  . metoprolol  5 mg Intravenous 4 times per day  . neomycin-bacitracin-polymyxin   Topical 3 times per day  . sodium chloride  3 mL Intravenous Q12H    Continuous Infusions:   Past Medical History  Diagnosis Date  . Hypertension   . TIA (transient ischemic attack)     history without residual deficits  . Leukocytosis   . Iron deficiency anemia   . COPD (chronic obstructive pulmonary disease)   . Pneumonia     "couple times" (12/05/2013)  . History of blood transfusion 2013    "low blood pressure after hip OR"  . Pernicious anemia      hx; "did B12 shots q month for years; must have outgrew it"  . Scarlet fever     "as a child"  . Stroke X 2-3    "didn't leave her w/any permanent problems that we know of" (12/05/2013)  . Chronic lower back pain   . UTI (lower urinary tract infection) 01/2013; 12/05/2013    "severe";   . Cancer 1949    "took al the fat out of the inside of one of her legs"    Past Surgical History  Procedure Laterality Date  . Hip arthroplasty  03/17/2012    Procedure: ARTHROPLASTY BIPOLAR HIP;  Surgeon: Mcarthur Rossetti, MD;  Location: [facility];  Service: Orthopedics;  Laterality: Right;  . Ankle fracture surgery    . Appendectomy    . Cataract extraction w/ intraocular lens  implant, bilateral Bilateral   . Kyphoplasty  X 3  . Back surgery    . Lumbar disc surgery  X 2  . Abdominal hysterectomy  07/1971    Molli Barrows, [place], [place], [place] Pager [phone] After Hours Pager [phone]

## 2013-12-08 ENCOUNTER — Inpatient Hospital Stay (HOSPITAL_COMMUNITY): Payer: Medicare Other

## 2013-12-08 DIAGNOSIS — E43 Unspecified severe protein-calorie malnutrition: Secondary | ICD-10-CM

## 2013-12-08 LAB — CBC
HEMATOCRIT: 35.4 % — AB (ref 36.0–46.0)
HEMOGLOBIN: 11.4 g/dL — AB (ref 12.0–15.0)
MCH: 26.9 pg (ref 26.0–34.0)
MCHC: 32.2 g/dL (ref 30.0–36.0)
MCV: 83.5 fL (ref 78.0–100.0)
Platelets: 182 10*3/uL (ref 150–400)
RBC: 4.24 MIL/uL (ref 3.87–5.11)
RDW: 14.3 % (ref 11.5–15.5)
WBC: 6 10*3/uL (ref 4.0–10.5)

## 2013-12-08 LAB — BASIC METABOLIC PANEL
BUN: 46 mg/dL — AB (ref 6–23)
CALCIUM: 9.5 mg/dL (ref 8.4–10.5)
CO2: 26 mEq/L (ref 19–32)
Chloride: 101 mEq/L (ref 96–112)
Creatinine, Ser: 0.73 mg/dL (ref 0.50–1.10)
GFR calc Af Amer: 81 mL/min — ABNORMAL LOW (ref >90)
GFR calc non Af Amer: 70 mL/min — ABNORMAL LOW (ref >90)
Glucose, Bld: 146 mg/dL — ABNORMAL HIGH (ref 70–99)
Potassium: 4.3 mEq/L (ref 3.7–5.3)
Sodium: 141 mEq/L (ref 137–147)

## 2013-12-08 MED ORDER — LEVOFLOXACIN 750 MG PO TABS
750.0000 mg | ORAL_TABLET | Freq: Every day | ORAL | Status: DC
  Administered 2013-12-08: 750 mg via ORAL
  Filled 2013-12-08 (×2): qty 1

## 2013-12-08 MED ORDER — LORAZEPAM 2 MG/ML IJ SOLN: 0.2500 mg | INTRAMUSCULAR | Status: DC | PRN

## 2013-12-08 MED ORDER — LEVOFLOXACIN 500 MG PO TABS: 500.0000 mg | ORAL_TABLET | ORAL | Status: DC

## 2013-12-08 MED ORDER — PREDNISONE 20 MG PO TABS
40.0000 mg | ORAL_TABLET | Freq: Every day | ORAL | Status: DC
  Administered 2013-12-08 – 2013-12-09 (×2): 40 mg via ORAL
  Filled 2013-12-08 (×3): qty 2

## 2013-12-08 MED ORDER — ASPIRIN 81 MG PO CHEW
81.0000 mg | CHEWABLE_TABLET | Freq: Every day | ORAL | Status: DC
  Administered 2013-12-08 – 2013-12-09 (×2): 81 mg via ORAL
  Filled 2013-12-08 (×2): qty 1

## 2013-12-08 MED ORDER — IOHEXOL 300 MG/ML  SOLN
100.0000 mL | Freq: Once | INTRAMUSCULAR | Status: AC | PRN
  Administered 2013-12-08: 100 mL via INTRAVENOUS

## 2013-12-08 NOTE — Progress Notes (Signed)
[facility] PROGRESS NOTE  Holly Allen EXB:[id number] DOB: [birth year] DOA: 12/05/2013 PCP: Woody Seller, MD  Brief Summary  Holly Allen is a 80 y.o. female, HTN, CVA, COPD, generalized deconditioning due to advanced age who lives at home with her son and was recently admitted for CVA is brought in after an episode of fall which happened day prior to admission.  Over the last two weeks, she has become progressively more SOB.  The day prior to admission, shefell and hit the back of her head on the floor and sustained a small laceration which was treated at home.  She continued to have increased WOB and became so weak, she was unable to flush the toilet.  Overnight, she became more confused and was incontinent in bed, which never happens.  She was in moderate to severe respiratory distress in the ER.  CXR demonstrated emphysema.  UA with hematuria.  She complained of abdominal pain.  She was treated with lasix and duonebs and antibiotics initially.  Steroids have been added and CT abd/pelvis to eval her RUQ pain and to look for kidney stones was unremarkable.  She is very ill-appearing and family is aware that she is very sick.  She is DNR.    Assessment/Plan  Acute hypoxic respiratory failure secondary to possible acute diastolic heart failure versus acute COPD exacerbation, chest x-ray did not show evidence of vascular congestion but does show evidence of emphysema.  - Started solumedrol  - Continue nebulizer treatments  - Antibiotics as below  - Weaned oxygen as tolerated  - Judicious use of IV fluids  - Pt's o2 requirements improved as did mentation   Toxic encephalopathy from possible UTI/hypoxia  - CT head unremarkable  - Was initially NPO until more awake, at which point, pt was started on PO  - Improved with above regimen   Right upper quadrant abdominal pain, may be related to urinary tract infection however her urinalysis did not show white blood cells but copious red  blood cells suggesting kidney stone. The patient also still has her gallbladder.  - Liver function tests, lipase normal  - CT scan of the abdomen and pelvis without contrast demonstrated normal gallbladder and liver  - Incidentally, CT demonstrated multiple L sided renal cysts, a L adnexal cyst, Multiple  R breast masses that are most likely cysts, and an indeterminate 1.3cm low-density lesion in posterior aspect of mid-R kidney with recs for f/u triphasic CT scan  Possible UTI  - Continued on empiric ceftriaxone  - Urine culture pending  - Triphasic CT and mammogram ordered per recs  Recent ischemic CVA. Was seen extensively by neurology that admission, echo and carotids were not repeated as options for therapy were limited due to her age, she was switched from aspirin to Plavix  - Plavix was held secondary to lethargy  - Rectal aspirin daily until tolerating PO   Essential hypertension  - Schedule metoprolol IV every 6 hours until pt can tolerate PO, then resume home meds   Mild dementia. At risk for delirium.  - Feeding assistance, aspiration and fall precautions, minimize narcotic and benzodiazepine use.  .  Occipital scalp laceration. Local wound care with antibiotic cream.  Severe protein calorie malnutrition  - Nutrition following   Diet:  N.p.o. Access:  PIV IVF:  Off  Proph:  SCDs  Code Status: DNR Family Communication: Pt and family in room Disposition Plan: possibly to SNF in a few days, patient very ill   Consultants:  none  Procedures:  CXR  CT cervical spine  CT head  Repeat CXT  Antibiotics:  Ceftriaxone 6/8 >>  HPI/Subjective: Tired appearing and lethargic this AM.  Objective: Filed Vitals:   12/07/13 1445 12/07/13 1954 12/08/13 0507 12/08/13 1040  BP: 132/88 130/72 179/82 120/57  Pulse: 114 132 82 97  Temp: 99.2 F (37.3 C) 97.2 F (36.2 C) 97.2 F (36.2 C)   TempSrc: Axillary Oral Oral   Resp: 22 22 18    Weight:      SpO2: 94% 93%  100%     Intake/Output Summary (Last 24 hours) at 12/08/13 1221 Last data filed at 12/08/13 1100  Gross per 24 hour  Intake    120 ml  Output      1 ml  Net    119 ml   Filed Weights   12/06/13 0617  Weight: 101 lb 12.8 oz (46.176 kg)    Exam:   General:  Cachectic Caucasian female, mild to moderate respiratory distress  HEENT:  NCAT, MMM  Cardiovascular:  Tachycardic, RR, nl S1, S2 no mrg, 2+ pulses, warm extremities  Respiratory:  Full expiratory wheeze throughout, diminished throughout, no focal rales or rhonchi, SEM, intercostal, subcostal retractions  Abdomen:   Hyperactive BS, soft, nondistended, tender to palpation in the right upper quadrant with questionable positive Murphy sign   MSK:   Decreased tone and bulk, no LEE  Neuro:  Some stiffness of the left upper extremity, was able to wiggle toes on bilateral lower extremities but unable to lift legs from bed, had some voluntary movement of the right upper extremity, no obvious facial droop  Data Reviewed: Basic Metabolic Panel:  Recent Labs Lab 12/05/13 0945 12/05/13 1354 12/06/13 0505 12/07/13 0507 12/08/13 0526  NA 132*  --  133* 142 141  K 5.5* 4.5 4.2 4.6 4.3  CL 96  --  96 94* 101  CO2 21  --  22 21 26   GLUCOSE 159*  --  123* 154* 146*  BUN 17  --  19 34* 46*  CREATININE 0.65  --  0.83 0.78 0.73  CALCIUM 9.3  --  9.1 9.1 9.5   Liver Function Tests:  Recent Labs Lab 12/06/13 0505  AST 20  ALT 9  ALKPHOS 94  BILITOT 1.2  PROT 6.2  ALBUMIN 3.0*    Recent Labs Lab 12/06/13 0505  LIPASE 8*   No results found for this basename: AMMONIA,  in the last 168 hours CBC:  Recent Labs Lab 12/05/13 0945 12/06/13 0505 12/07/13 0507 12/08/13 0526  WBC 9.9 8.8 4.6 6.0  HGB 13.8 12.0 10.9* 11.4*  HCT 42.6 36.9 34.5* 35.4*  MCV 82.9 81.5 82.7 83.5  PLT 163 153 146* 182   Cardiac Enzymes:  Recent Labs Lab 12/05/13 1605 12/05/13 2007 12/06/13 0230  TROPONINI <0.30 <0.30 <0.30   BNP  (last 3 results) No results found for this basename: PROBNP,  in the last 8760 hours CBG: No results found for this basename: GLUCAP,  in the last 168 hours  No results found for this or any previous visit (from the past 240 hour(s)).   Studies: No results found.  Scheduled Meds: . aspirin  81 mg Oral Daily  . cefTRIAXone (ROCEPHIN)  IV  1 g Intravenous Q24H  . clopidogrel  75 mg Oral Q breakfast  . metoprolol  50 mg Oral BID  . neomycin-bacitracin-polymyxin   Topical 3 times per day  . predniSONE  40 mg Oral Q breakfast  . sodium  chloride  3 mL Intravenous Q12H   Continuous Infusions:   Principal Problem:   Acute encephalopathy Active Problems:   Hypertension   Hx of stroke without residual deficits   Anemia   COPD (chronic obstructive pulmonary disease)   Mild cognitive impairment, so stated   Syncope   Altered mental status   Hematuria   Acute respiratory failure with hypoxia   COPD with acute exacerbation   Protein-calorie malnutrition, severe    Time spent: 30 min    Kiara Mcdowell, [facility] Pager [phone]. If 7PM-7AM, please contact night-coverage at [email or web address], password [facility] 12/08/2013, 12:21 PM  LOS: 3 days

## 2013-12-08 NOTE — Progress Notes (Signed)
CSW (Clinical Education officer, museum) notified facility of potential dc today but currently undecided. Facility asked for family to complete paperwork today at 1:30pm incase pt is ready for dc later today. CSW called pt daughter Holly Allen and notified. She confirmed this time would work and someone from the family would be at facility.  [place], [place]

## 2013-12-08 NOTE — Discharge Summary (Addendum)
Physician Discharge Summary  VERNA DESROCHER YIR:[id number] DOB: 03-Aug-1914 DOA: 12/05/2013  PCP: Woody Seller, MD  Admit date: 12/05/2013 Discharge date: 12/09/2013  Time spent: 35 minutes  Recommendations for Outpatient Follow-up:  1. Follow up with PCP in 1-2 weeks 2. Recommend f/u mammogram or breast US as outpatient to follow up on R breast mass 3. Also consider follow up pelvic ultrasound to f/u 2.3cm L adnexal cystic mass  Discharge Diagnoses:  Principal Problem:   Acute encephalopathy Active Problems:   Hypertension   Hx of stroke without residual deficits   Anemia   COPD (chronic obstructive pulmonary disease)   Mild cognitive impairment, so stated   Syncope   Altered mental status   Hematuria   Acute respiratory failure with hypoxia   COPD with acute exacerbation   Protein-calorie malnutrition, severe   Discharge Condition: Improved  Diet recommendation: Dysphagia 1 with Nectar thick liquids  Filed Weights   12/06/13 0617 12/08/13 1432 12/09/13 0639  Weight: 101 lb 12.8 oz (46.176 kg) 101 lb 13.6 oz (46.2 kg) 99 lb 3.2 oz (44.997 kg)    History of present illness:  Holly Allen is a 80 y.o. female, HTN, CVA, COPD, generalized deconditioning due to advanced age who lives at home with her son and was recently admitted for CVA is brought in after an episode of fall which happened day prior to admission. Over the last two weeks, she has become progressively more SOB. The day prior to admission, shefell and hit the back of her head on the floor and sustained a small laceration which was treated at home. She continued to have increased WOB and became so weak, she was unable to flush the toilet. Overnight, she became more confused and was incontinent in bed, which never happens. She was in moderate to severe respiratory distress in the ER. CXR demonstrated emphysema. UA with hematuria. She complained of abdominal pain. She was treated with lasix and duonebs and antibiotics  initially. Steroids have been added and CT abd/pelvis to eval her RUQ pain and to look for kidney stones was unremarkable. She is very ill-appearing and family is aware that she is very sick. She is DNR.   Hospital Course:  Acute hypoxic respiratory failure secondary to possible acute diastolic heart failure versus acute COPD exacerbation, chest x-ray did not show evidence of vascular congestion but does show evidence of emphysema.  - Started solumedrol  - Continue nebulizer treatments  - Antibiotics as below  - Weaned oxygen as tolerated  - Judicious use of IV fluids  - Pt's o2 requirements improved as did mentation   Toxic encephalopathy from possible UTI/hypoxia  - CT head unremarkable - Was initially NPO until more awake, at which point, pt was started on PO  - Improved with above regimen  Right upper quadrant abdominal pain, may be related to urinary tract infection however her urinalysis did not show white blood cells but copious red blood cells suggesting kidney stone. The patient also still has her gallbladder.  - Liver function tests, lipase normal - CT scan of the abdomen and pelvis without contrast demonstrated normal gallbladder and liver - Incidentally, CT demonstrated multiple L sided renal cysts, a L adnexal cyst, Multiple  R breast masses that are most likely cysts, and an indeterminate 1.3cm low-density lesion in posterior aspect of mid-R kidney - F/u triphasic CT scan confirmed the lesion in question to be a renal cyst - Incidentally, an aggressive appearing R breast mass was noted. Mammogram was initially  ordered, but is not usually performed in the inpatient setting. Recommend breast US or mammogram as outpatient  Possible UTI  - Continued on empiric ceftriaxone and transitioned to levovfloxacin on 6/11  Recent ischemic CVA. Was seen extensively by neurology that admission, echo and carotids were not repeated as options for therapy were limited due to her age, she was  switched from aspirin to Plavix  - Plavix was initially held secondary to lethargy   Essential hypertension  - BP remained stable  Mild dementia. At risk for delirium.  - Feeding assistance, aspiration and fall precautions, minimize narcotic and benzodiazepine use.  . Occipital scalp laceration. Local wound care with antibiotic cream.   Severe protein calorie malnutrition  - Nutrition following   Consultations:  none  Discharge Exam: Filed Vitals:   12/08/13 1432 12/08/13 1507 12/08/13 2121 12/09/13 0639  BP:  131/58 155/78 179/73  Pulse:  83 99 72  Temp:  97.6 F (36.4 C) 97.5 F (36.4 C) 97.7 F (36.5 C)  TempSrc:  Oral Oral Oral  Resp:  18 18 18   Height: 5' 4.17" (1.63 m)     Weight: 101 lb 13.6 oz (46.2 kg)   99 lb 3.2 oz (44.997 kg)  SpO2:  98% 98% 100%    General: Awake, in nad Cardiovascular: regular, s1, s2 Respiratory: normal resp effort, no wheezing  Discharge Instructions     Medication List         albuterol (2.5 MG/3ML) 0.083% nebulizer solution  Commonly known as:  PROVENTIL  Take 2.5 mg by nebulization 2 (two) times daily.     cholecalciferol 1000 UNITS tablet  Commonly known as:  VITAMIN D  Take 1,000 Units by mouth daily.     clopidogrel 75 MG tablet  Commonly known as:  PLAVIX  Take 75 mg by mouth daily with breakfast.     HYDROcodone-acetaminophen 5-325 MG per tablet  Commonly known as:  NORCO/VICODIN  Take 0.5 tablets by mouth 2 (two) times daily as needed (pain).     levofloxacin 500 MG tablet  Commonly known as:  LEVAQUIN  Take 1 tablet (500 mg total) by mouth every other day.  Start taking on:  12/10/2013     metoprolol 50 MG tablet  Commonly known as:  LOPRESSOR  Take 50 mg by mouth 2 (two) times daily.     predniSONE 20 MG tablet  Commonly known as:  DELTASONE  Take 2 tablets (40 mg total) by mouth daily with breakfast.       Allergies  Allergen Reactions  . Codeine     vomits  . Other Other (See Comments)     Muscle relaxant may have caused her stroke per family   Follow-up Information   Follow up with Woody Seller, MD. Schedule an appointment as soon as possible for a visit in 1 week.   Specialty:  Family Medicine   Contact information:   [address] [place] [address] [phone]        The results of significant diagnostics from this hospitalization (including imaging, microbiology, ancillary and laboratory) are listed below for reference.    Significant Diagnostic Studies: Ct Abdomen Pelvis Wo Contrast  12/06/2013   CLINICAL DATA:  Right upper quadrant pain.  Hematuria.  EXAM: CT ABDOMEN AND PELVIS WITHOUT CONTRAST  TECHNIQUE: Multidetector CT imaging of the abdomen and pelvis was performed following the standard protocol without IV contrast.  COMPARISON:  Abdominal series 01/18/2013.  FINDINGS: Liver normal. Spleen normal. Pancreas  normal. Gallbladder is nondistended. Mild prominence of the common bile duct at 7 mm. No evidence of obstructing biliary lesion.  Right adrenal 1 cm low-density lesion with a Hounsfield unit of 7, most likely tiny adrenal adenoma. Adrenals otherwise normal. 2.9 cm left renal cyst. Left parapelvic cysts. Indeterminate approximately 1.3 cm low-density lesion posterior aspect of midportion of right kidney. Tiny nodular component within this lesion may be present. Triphasic CT can be obtained for further evaluation. No hydronephrosis or obstructing ureteral stone. Bladder is nondistended. Multiple pelvic phleboliths. The pelvis is difficult to evaluate due to streak artifact from right hip replacement and motion artifact. Hysterectomy. 2.2 cm left adnexal simple cyst. No free pelvic fluid.  No significant adenopathy. Diffuse aortic and visceral atherosclerotic vascular disease. Aortoiliac ectasia with maximum diameter of the abdominal aorta at 2.4 cm and maximum diameter of the common iliac arteries at 1.3 cm.  Appendectomy. No inflammatory changes in the right  or left lower quadrant. Diverticulosis. No evidence of bowel obstruction or free air. Stomach is nondistended. Small sliding hiatal hernia is present. No mesenteric mass. Bilateral inguinal hernias with herniation of fat. Tiny umbilical hernia with herniation of fat.  Chronic interstitial lung disease. Coronary artery disease. Multiple right breast masses, most likely cysts. Correlation with mammography and and if necessary breast ultrasound suggested. Diffuse degenerative changes lumbar spine. Mobile thoracolumbar and sacral vertebroplasties. Right hip replacement. Severe diffuse osteopenia.  IMPRESSION: 1. Indeterminate 1.3 cm low-density lesion posterior aspect midportion right kidney. A tiny nodular component may be present within this low-density lesion. Triphasic CT can be obtained for further evaluation. No hydronephrosis or evidence of obstructing calcified ureteral stone. 2. 2.9 cm simple left renal cyst.  Left renal parapelvic cysts. 3. 2.2 cm simple left adnexal cyst.  Hysterectomy. 4. Aortoiliac atherosclerotic vascular disease. 5. Chronic interstitial lung disease. 6. Coronary artery disease. 7. Multiple prominent right breast masses, most likely cysts. Correlation with mammography and if need be breast ultrasound. 8. Severe thoracolumbar lumbosacral degenerative change with multiple thoracolumbar and sacral vertebral plasties. Diffuse osteopenia .   Electronically Signed   By: Marcello Moores  Register   On: 12/06/2013 12:41   Ct Head Wo Contrast  12/05/2013   CLINICAL DATA:  Recent traumatic injury with pain  EXAM: CT HEAD WITHOUT CONTRAST  CT CERVICAL SPINE WITHOUT CONTRAST  TECHNIQUE: Multidetector CT imaging of the head and cervical spine was performed following the standard protocol without intravenous contrast. Multiplanar CT image reconstructions of the cervical spine were also generated.  COMPARISON:  02/02/2013  FINDINGS: CT HEAD FINDINGS  The bony calvarium is intact. No gross soft tissue  abnormality is noted. Atrophic changes and chronic white matter ischemic change are seen. There is calcification noted adjacent to the thalamus and basal ganglia on the left. This is stable from the prior exam.  CT CERVICAL SPINE FINDINGS  The examination is somewhat limited by patient motion artifact. Seven cervical segments are well visualized. Vertebral body height is well maintained. No acute fracture or facet abnormality is noted. No gross soft tissue abnormality is seen. Visualized lung apices are within normal limits.  IMPRESSION: CT head: Chronic changes without acute abnormality.  CT of the cervical spine: Significant motion artifact limits examination. No gross abnormality is seen.   Electronically Signed   By: Inez Catalina M.D.   On: 12/05/2013 10:48   Ct Cervical Spine Wo Contrast  12/05/2013   CLINICAL DATA:  Recent traumatic injury with pain  EXAM: CT HEAD WITHOUT CONTRAST  CT CERVICAL  SPINE WITHOUT CONTRAST  TECHNIQUE: Multidetector CT imaging of the head and cervical spine was performed following the standard protocol without intravenous contrast. Multiplanar CT image reconstructions of the cervical spine were also generated.  COMPARISON:  02/02/2013  FINDINGS: CT HEAD FINDINGS  The bony calvarium is intact. No gross soft tissue abnormality is noted. Atrophic changes and chronic white matter ischemic change are seen. There is calcification noted adjacent to the thalamus and basal ganglia on the left. This is stable from the prior exam.  CT CERVICAL SPINE FINDINGS  The examination is somewhat limited by patient motion artifact. Seven cervical segments are well visualized. Vertebral body height is well maintained. No acute fracture or facet abnormality is noted. No gross soft tissue abnormality is seen. Visualized lung apices are within normal limits.  IMPRESSION: CT head: Chronic changes without acute abnormality.  CT of the cervical spine: Significant motion artifact limits examination. No gross  abnormality is seen.   Electronically Signed   By: Inez Catalina M.D.   On: 12/05/2013 10:48   Dg Chest Port 1 View  12/05/2013   CLINICAL DATA:  Increasing shortness of breath  EXAM: PORTABLE CHEST - 1 VIEW  COMPARISON:  12/05/2013 at 1000 hrs  FINDINGS: Chronic interstitial markings/emphysematous changes. No focal consolidation. No pleural effusion or pneumothorax.  The heart is normal in size. Mild perihilar prominence, chronic, likely vascular.  IMPRESSION: No evidence of acute cardiopulmonary disease.   Electronically Signed   By: Julian Hy M.D.   On: 12/05/2013 18:55   Dg Chest Portable 1 View  12/05/2013   CLINICAL DATA:  80 year old female status post fall on Plavix. Confusion. Initial encounter.  EXAM: PORTABLE CHEST - 1 VIEW  COMPARISON:  02/02/2013 and earlier.  FINDINGS: Portable AP view at 1000 hrs. Stable cardiac size and mediastinal contours. Tortuous thoracic aorta with calcified plaque. No pneumothorax. Allowing for portable technique, the lungs are clear. Visualized tracheal air column is within normal limits. Osteopenia. Multilevel treated lower thoracic/ upper lumbar compression fractures. Chronic right lateral rib fractures. No definite acute osseous injury.  IMPRESSION: No acute cardiopulmonary abnormality.   Electronically Signed   By: Lars Pinks M.D.   On: 12/05/2013 10:16    Microbiology: No results found for this or any previous visit (from the past 240 hour(s)).   Labs: Basic Metabolic Panel:  Recent Labs Lab 12/05/13 0945 12/05/13 1354 12/06/13 0505 12/07/13 0507 12/08/13 0526 12/09/13 0330  NA 132*  --  133* 142 141 140  K 5.5* 4.5 4.2 4.6 4.3 4.4  CL 96  --  96 94* 101 103  CO2 21  --  22 21 26 25   GLUCOSE 159*  --  123* 154* 146* 132*  BUN 17  --  19 34* 46* 39*  CREATININE 0.65  --  0.83 0.78 0.73 0.65  CALCIUM 9.3  --  9.1 9.1 9.5 9.5   Liver Function Tests:  Recent Labs Lab 12/06/13 0505  AST 20  ALT 9  ALKPHOS 94  BILITOT 1.2  PROT 6.2   ALBUMIN 3.0*    Recent Labs Lab 12/06/13 0505  LIPASE 8*   No results found for this basename: AMMONIA,  in the last 168 hours CBC:  Recent Labs Lab 12/05/13 0945 12/06/13 0505 12/07/13 0507 12/08/13 0526 12/09/13 0330  WBC 9.9 8.8 4.6 6.0 5.1  HGB 13.8 12.0 10.9* 11.4* 11.0*  HCT 42.6 36.9 34.5* 35.4* 34.5*  MCV 82.9 81.5 82.7 83.5 83.7  PLT 163 153 146* 182  182   Cardiac Enzymes:  Recent Labs Lab 12/05/13 1605 12/05/13 2007 12/06/13 0230  TROPONINI <0.30 <0.30 <0.30   BNP: BNP (last 3 results) No results found for this basename: PROBNP,  in the last 8760 hours CBG: No results found for this basename: GLUCAP,  in the last 168 hours     Signed:  CHIU, STEPHEN K  [facility] 12/09/2013, 10:10 AM

## 2013-12-08 NOTE — Progress Notes (Signed)
1525 12-08-13 Medicare IM signed by patient's daughter and signed copy left on chart. Ocie Cornfield [place], RN,BSN [phone]

## 2013-12-08 NOTE — Progress Notes (Signed)
Speech Language Pathology Treatment: Dysphagia  Patient Details Name: Holly Allen MRN: [id number] DOB: 1915/03/22 Today's Date: 12/08/2013 Time: 7829-5621 SLP Time Calculation (min): 25 min  Assessment / Plan / Recommendation Clinical Impression  Pt demonstrates poor tolerance of dys 3 (mechanical soft) solids with prolonged mastication - over several minutes, unable to clear oral cavity with max cues even after several swallows initiated. Pt with much more efficient timely transit with purees on breakfast tray. Continued delayed evidence of aspiration with nectar thick liquids if bolus size on controlled (pt taking large sips). Reduced with SLP controlled straw sips. Definitive evidence of aspiration with thin liquids. Discussed findings with pts daughter who agreed to puree/nectar diet with some risk of aspiration even with these textures. Hopeful that pts function will improve as mental status improves. Will avoid objective testing as pts family is aware of risk with modified diet, but would not want pt to be NPO if significant dysphagia identified.    HPI HPI: 80 y.o. female, HTN, CVA, COPD, generalized deconditioning due to advanced age who lives at home with her son and was recently admitted for CVA is brought in after an episode of fall which happened day prior to admission. Pt has a h/o dysphagia requiring nectar thick liquids after recent CVA, although daughter reports that she had been advanced to thin liquids since d/c.   Pertinent Vitals NA  SLP Plan  Continue with current plan of care    Recommendations Diet recommendations: Dysphagia 1 (puree);Nectar-thick liquid Liquids provided via: Straw;Cup Medication Administration: Whole meds with puree Supervision: Staff to assist with self feeding;Full supervision/cueing for compensatory strategies Compensations: Slow rate;Small sips/bites;Check for pocketing;Follow solids with liquid Postural Changes and/or Swallow Maneuvers: Seated  upright 90 degrees;Upright 30-60 min after meal              Oral Care Recommendations: Oral care BID Follow up Recommendations: 24 hour supervision/assistance;Skilled Nursing facility Plan: Continue with current plan of care    GO    [facility], MA CCC-SLP [phone]  Lynann Beaver 12/08/2013, 9:21 AM

## 2013-12-09 DIAGNOSIS — J441 Chronic obstructive pulmonary disease with (acute) exacerbation: Secondary | ICD-10-CM

## 2013-12-09 LAB — BASIC METABOLIC PANEL
BUN: 39 mg/dL — ABNORMAL HIGH (ref 6–23)
CALCIUM: 9.5 mg/dL (ref 8.4–10.5)
CO2: 25 mEq/L (ref 19–32)
CREATININE: 0.65 mg/dL (ref 0.50–1.10)
Chloride: 103 mEq/L (ref 96–112)
GFR calc Af Amer: 84 mL/min — ABNORMAL LOW (ref >90)
GFR calc non Af Amer: 73 mL/min — ABNORMAL LOW (ref >90)
GLUCOSE: 132 mg/dL — AB (ref 70–99)
Potassium: 4.4 mEq/L (ref 3.7–5.3)
SODIUM: 140 meq/L (ref 137–147)

## 2013-12-09 LAB — CBC
HCT: 34.5 % — ABNORMAL LOW (ref 36.0–46.0)
HEMOGLOBIN: 11 g/dL — AB (ref 12.0–15.0)
MCH: 26.7 pg (ref 26.0–34.0)
MCHC: 31.9 g/dL (ref 30.0–36.0)
MCV: 83.7 fL (ref 78.0–100.0)
PLATELETS: 182 10*3/uL (ref 150–400)
RBC: 4.12 MIL/uL (ref 3.87–5.11)
RDW: 14.4 % (ref 11.5–15.5)
WBC: 5.1 10*3/uL (ref 4.0–10.5)

## 2013-12-09 MED ORDER — LEVOFLOXACIN 500 MG PO TABS: 500.0000 mg | ORAL_TABLET | ORAL | Status: DC

## 2013-12-09 MED ORDER — PREDNISONE 20 MG PO TABS: 40.0000 mg | ORAL_TABLET | Freq: Every day | ORAL | Status: DC

## 2013-12-09 NOTE — Progress Notes (Signed)
CSW (Clinical Education officer, museum) prepared pt dc packet and placed with shadow chart. CSW arranged non-emergent ambulance transport. Pt, pt son at bedside, pt nurse, and facility informed. CSW signing off.   [place], [place]

## 2014-03-10 ENCOUNTER — Emergency Department (HOSPITAL_BASED_OUTPATIENT_CLINIC_OR_DEPARTMENT_OTHER)
Admission: EM | Admit: 2014-03-10 | Discharge: 2014-03-10 | Disposition: A | Discharge status: Home / Self Care | Payer: Medicare Other | Attending: Emergency Medicine | Admitting: Emergency Medicine

## 2014-03-10 ENCOUNTER — Emergency Department (HOSPITAL_BASED_OUTPATIENT_CLINIC_OR_DEPARTMENT_OTHER): Payer: Medicare Other

## 2014-03-10 ENCOUNTER — Encounter (HOSPITAL_BASED_OUTPATIENT_CLINIC_OR_DEPARTMENT_OTHER): Payer: Self-pay | Admitting: Emergency Medicine

## 2014-03-10 DIAGNOSIS — Y929 Unspecified place or not applicable: Secondary | ICD-10-CM | POA: Insufficient documentation

## 2014-03-10 DIAGNOSIS — S2249XA Multiple fractures of ribs, unspecified side, initial encounter for closed fracture: Secondary | ICD-10-CM | POA: Insufficient documentation

## 2014-03-10 DIAGNOSIS — X58XXXA Exposure to other specified factors, initial encounter: Secondary | ICD-10-CM | POA: Insufficient documentation

## 2014-03-10 DIAGNOSIS — IMO0002 Reserved for concepts with insufficient information to code with codable children: Secondary | ICD-10-CM | POA: Insufficient documentation

## 2014-03-10 DIAGNOSIS — N390 Urinary tract infection, site not specified: Secondary | ICD-10-CM | POA: Insufficient documentation

## 2014-03-10 DIAGNOSIS — Z79899 Other long term (current) drug therapy: Secondary | ICD-10-CM | POA: Diagnosis not present

## 2014-03-10 DIAGNOSIS — Y939 Activity, unspecified: Secondary | ICD-10-CM | POA: Diagnosis not present

## 2014-03-10 DIAGNOSIS — S2232XA Fracture of one rib, left side, initial encounter for closed fracture: Secondary | ICD-10-CM

## 2014-03-10 LAB — TROPONIN I

## 2014-03-10 LAB — BASIC METABOLIC PANEL
ANION GAP: 10 (ref 5–15)
BUN: 15 mg/dL (ref 6–23)
CALCIUM: 9.3 mg/dL (ref 8.4–10.5)
CO2: 28 mEq/L (ref 19–32)
Chloride: 96 mEq/L (ref 96–112)
Creatinine, Ser: 0.8 mg/dL (ref 0.50–1.10)
GFR, EST AFRICAN AMERICAN: 70 mL/min — AB (ref >90)
GFR, EST NON AFRICAN AMERICAN: 60 mL/min — AB (ref >90)
Glucose, Bld: 156 mg/dL — ABNORMAL HIGH (ref 70–99)
POTASSIUM: 4.3 meq/L (ref 3.7–5.3)
SODIUM: 134 meq/L — AB (ref 137–147)

## 2014-03-10 LAB — URINALYSIS, ROUTINE W REFLEX MICROSCOPIC
Bilirubin Urine: NEGATIVE
Glucose, UA: NEGATIVE mg/dL
KETONES UR: NEGATIVE mg/dL
Nitrite: POSITIVE — AB
Protein, ur: NEGATIVE mg/dL
Specific Gravity, Urine: 1.01 (ref 1.005–1.030)
UROBILINOGEN UA: 0.2 mg/dL (ref 0.0–1.0)
pH: 6 (ref 5.0–8.0)

## 2014-03-10 LAB — CBC WITH DIFFERENTIAL/PLATELET
BASOS ABS: 0 10*3/uL (ref 0.0–0.1)
BASOS PCT: 0 % (ref 0–1)
EOS ABS: 0.2 10*3/uL (ref 0.0–0.7)
EOS PCT: 4 % (ref 0–5)
HCT: 35.4 % — ABNORMAL LOW (ref 36.0–46.0)
Hemoglobin: 11 g/dL — ABNORMAL LOW (ref 12.0–15.0)
Lymphocytes Relative: 27 % (ref 12–46)
Lymphs Abs: 1.2 10*3/uL (ref 0.7–4.0)
MCH: 25.8 pg — AB (ref 26.0–34.0)
MCHC: 31.1 g/dL (ref 30.0–36.0)
MCV: 82.9 fL (ref 78.0–100.0)
Monocytes Absolute: 0.5 10*3/uL (ref 0.1–1.0)
Monocytes Relative: 12 % (ref 3–12)
Neutro Abs: 2.6 10*3/uL (ref 1.7–7.7)
Neutrophils Relative %: 57 % (ref 43–77)
PLATELETS: 167 10*3/uL (ref 150–400)
RBC: 4.27 MIL/uL (ref 3.87–5.11)
RDW: 15.6 % — AB (ref 11.5–15.5)
WBC: 4.5 10*3/uL (ref 4.0–10.5)

## 2014-03-10 LAB — URINE MICROSCOPIC-ADD ON

## 2014-03-10 MED ORDER — HYDROCODONE-ACETAMINOPHEN 5-325 MG PO TABS
1.0000 | ORAL_TABLET | ORAL | Status: AC
  Administered 2014-03-10: 1 via ORAL
  Filled 2014-03-10: qty 1

## 2014-03-10 MED ORDER — CEPHALEXIN 500 MG PO CAPS: 500.0000 mg | ORAL_CAPSULE | Freq: Three times a day (TID) | ORAL | Status: DC

## 2014-03-10 MED ORDER — HYDROCODONE-ACETAMINOPHEN 5-325 MG PO TABS: 1.0000 | ORAL_TABLET | ORAL | Status: DC | PRN

## 2014-03-10 NOTE — ED Notes (Signed)
Patient transported to X-ray 

## 2014-03-10 NOTE — ED Notes (Signed)
Pt. Reports no injury to back or L rib area.  Pt reporting since last Sat. She has had pain in the L back and rib area with some pain taking a deep breath.  Pt. Is on oxygen all the time 2 liters.

## 2014-03-10 NOTE — ED Provider Notes (Signed)
CSN: [id number]     Arrival date & time 03/10/14  1333 History   First MD Initiated Contact with Patient 03/10/14 1518     Chief Complaint  Patient presents with  . Back Pain   HPI   The pain is in the middle of her left side in the back.  The pain started last Saturday.  As the week progressed the symptoms increased.  Now it has moved to the front as well.  She has been taking alleve and 1/2 hydrocodone but the pain persists.  No falls.  No shortness, fevers, or cough.     Pain increases with movement and palpation. She's also intermittently noticed some pain in her left arm. She's not sure what brings that on particularly. Her daughter brought her into the emergency room today she was concerned about the amount of pain medication she was having to take. Past Medical History  Diagnosis Date  . Hypertension   . TIA (transient ischemic attack)     history without residual deficits  . Leukocytosis   . Iron deficiency anemia   . COPD (chronic obstructive pulmonary disease)   . Pneumonia     "couple times" (12/05/2013)  . History of blood transfusion 2013    "low blood pressure after hip OR"  . Pernicious anemia     hx; "did B12 shots q month for years; must have outgrew it"  . Scarlet fever     "as a child"  . Stroke X 2-3    "didn't leave her w/any permanent problems that we know of" (12/05/2013)  . Chronic lower back pain   . UTI (lower urinary tract infection) 01/2013; 12/05/2013    "severe";   . 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 Hip arthroplasty  03/17/2012    Procedure: ARTHROPLASTY BIPOLAR HIP;  Surgeon: Mcarthur Rossetti, MD;  Location: [facility];  Service: Orthopedics;  Laterality: Right;  . Ankle fracture surgery    . Appendectomy    . Cataract extraction w/ intraocular lens  implant, bilateral Bilateral   . Kyphoplasty  X 3  . Back surgery    . Lumbar disc surgery  X 2  . Abdominal  hysterectomy  07/1971   No family history on file. History  Substance Use Topics  . Smoking status: Never Smoker   . Smokeless tobacco: Never Used  . Alcohol Use: No   OB History   Grav Para Term Preterm Abortions TAB SAB Ect Mult Living                 Review of Systems  All other systems reviewed and are negative.     Allergies  Codeine and Other  Home Medications   Prior to Admission medications   Medication Sig Start Date End Date Taking? Authorizing Provider  albuterol (PROVENTIL) (2.5 MG/3ML) 0.083% nebulizer solution Take 2.5 mg by nebulization 2 (two) times daily.    Historical Provider, MD  cholecalciferol (VITAMIN D) 1000 UNITS tablet Take 1,000 Units by mouth daily.    Historical Provider, MD  clopidogrel (PLAVIX) 75 MG tablet Take 75 mg by mouth daily with breakfast.    Historical Provider, MD  HYDROcodone-acetaminophen (NORCO/VICODIN) 5-325 MG per tablet Take 0.5 tablets by mouth 2 (two) times daily as needed (pain).    Historical Provider, MD  HYDROcodone-acetaminophen (NORCO/VICODIN) 5-325 MG per tablet Take 1 tablet  by mouth every 4 (four) hours as needed. 03/10/14   Dorie Rank, MD  levofloxacin (LEVAQUIN) 500 MG tablet Take 1 tablet (500 mg total) by mouth every other day. 12/10/13   Donne Hazel, MD  metoprolol (LOPRESSOR) 50 MG tablet Take 50 mg by mouth 2 (two) times daily.    Historical Provider, MD  predniSONE (DELTASONE) 20 MG tablet Take 2 tablets (40 mg total) by mouth daily with breakfast. 12/09/13   Donne Hazel, MD   BP 158/66  Pulse 72  Temp(Src) 98.2 F (36.8 C) (Oral)  Resp 20  Ht 5' (1.524 m)  Wt 104 lb (47.174 kg)  BMI 20.31 kg/m2  SpO2 100% Physical Exam  Nursing note and vitals reviewed. Constitutional: No distress.  Elderly, frail  HENT:  Head: Normocephalic and atraumatic.  Right Ear: External ear normal.  Left Ear: External ear normal.  Eyes: Conjunctivae are normal. Right eye exhibits no discharge. Left eye exhibits no  discharge. No scleral icterus.  Neck: Neck supple. No tracheal deviation present.  Cardiovascular: Normal rate, regular rhythm and intact distal pulses.   Pulmonary/Chest: Effort normal and breath sounds normal. No stridor. No respiratory distress. She has no wheezes. She has no rales. She exhibits tenderness.  Tenderness to palpation inferior posterior lateral aspect of left chest wall  Abdominal: Soft. Bowel sounds are normal. She exhibits no distension. There is no tenderness. There is no rebound and no guarding.  Musculoskeletal: She exhibits no edema and no tenderness.  Neurological: She is alert. She has normal strength. No cranial nerve deficit (no facial droop, extraocular movements intact, no slurred speech) or sensory deficit. She exhibits normal muscle tone. She displays no seizure activity. Coordination normal.  Skin: Skin is warm and dry. No rash noted.  Psychiatric: She has a normal mood and affect.    ED Course  Procedures (including critical care time) Labs Review Labs Reviewed  CBC WITH DIFFERENTIAL - Abnormal; Notable for the following:    Hemoglobin 11.0 (*)    HCT 35.4 (*)    MCH 25.8 (*)    RDW 15.6 (*)    All other components within normal limits  BASIC METABOLIC PANEL - Abnormal; Notable for the following:    Sodium 134 (*)    Glucose, Bld 156 (*)    GFR calc non Af Amer 60 (*)    GFR calc Af Amer 70 (*)    All other components within normal limits  URINALYSIS, ROUTINE W REFLEX MICROSCOPIC - Abnormal; Notable for the following:    Hgb urine dipstick TRACE (*)    Nitrite POSITIVE (*)    Leukocytes, UA SMALL (*)    All other components within normal limits  URINE MICROSCOPIC-ADD ON - Abnormal; Notable for the following:    Squamous Epithelial / LPF FEW (*)    Bacteria, UA MANY (*)    All other components within normal limits  TROPONIN I    Imaging Review Dg Ribs Unilateral W/chest Left  03/10/2014   CLINICAL DATA:  80 year old female with fall, left  chest and rib pain.  EXAM: LEFT RIBS AND CHEST - 3+ VIEW  COMPARISON:  12/05/2013 and prior chest radiographs  FINDINGS: Cardiomegaly and tortuous/ectatic thoracic aorta again noted.  Mild bibasilar atelectasis is identified.  Diffuse osteopenia/osteoporosis noted.  There are multiple remote appearing right and upper left rib fractures.  At least 3 lower left rib fractures are age indeterminate.  There is no evidence of pleural effusion or pneumothorax.  Lower thoracic  and lumbar compression fractures and vertebral augmentation changes noted.  IMPRESSION: Three lower left rib fractures -age indeterminate.  Remote appearing bilateral rib fractures.  Lower thoracic and lumbar compression fractures with vertebral augmentation changes.  No evidence of pneumothorax or effusions.  Cardiomegaly and mild bibasilar atelectasis.   Electronically Signed   By: Hassan Rowan M.D.   On: 03/10/2014 16:13    EKG Interpretation  Date/Time:  Friday March 10 2014 16:11:12 EDT Ventricular Rate:  78 PR Interval:  190 QRS Duration: 62 QT Interval:  370 QTC Calculation: 421 R Axis:   -27 Text Interpretation:  Sinus rhythm with Premature supraventricular complexes Anterior infarct , age undetermined Abnormal ECG No significant change since last tracing except rate slower Confirmed by Orelia Brandstetter  MD-J, Sharne Linders ([id number]) on 03/10/2014 5:23:07 PM          MDM   Final diagnoses:  UTI (lower urinary tract infection)  Rib fractures, left, closed, initial encounter   Patient's chest x-ray shows rib fractures that correlate with her area of tenderness. The patient does not recall any recent injuries.  The chronicity is uncertain but I do believe this is the source of her tenderness.  Incidental UTI noted.  Plans discharge home with prescription for hydrocodone and Keflex. Patient has taken hydrocodone in the past without difficulty. Findings and the plan were discussed with the patient and her daughter.     Dorie Rank,  MD 03/10/14 [id number]

## 2014-03-10 NOTE — Discharge Instructions (Signed)
Rib Fracture A rib fracture is a break or crack in one of the bones of the ribs. The ribs are like a cage that goes around your upper chest. A broken or cracked rib is often painful, but most do not cause other problems. Most rib fractures heal on their own in 1-3 months. HOME CARE  Avoid activities that cause pain to the injured area. Protect your injured area.  Slowly increase activity as told by your doctor.  Take medicine as told by your doctor.  Put ice on the injured area for the first 1-2 days after you have been treated or as told by your doctor.  Put ice in a plastic bag.  Place a towel between your skin and the bag.  Leave the ice on for 15-20 minutes at a time, every 2 hours while you are awake.  Do deep breathing as told by your doctor. You may be told to:  Take deep breaths many times a day.  Cough many times a day while hugging a pillow.  Use a device (incentive spirometer) to perform deep breathing many times a day.  Drink enough fluids to keep your pee (urine) clear or pale yellow.   Do not wear a rib belt or binder. These do not allow you to breathe deeply. GET HELP RIGHT AWAY IF:   You have a fever.  You have trouble breathing.   You cannot stop coughing.  You cough up thick or bloody spit (mucus).   You feel sick to your stomach (nauseous), throw up (vomit), or have belly (abdominal) pain.   Your pain gets worse and medicine does not help.  MAKE SURE YOU:   Understand these instructions.  Will watch your condition.  Will get help right away if you are not doing well or get worse. Document Released: 03/25/2008 Document Revised: 10/11/2012 Document Reviewed: 08/18/2012 [facility] Patient Information 2015 [place], [place]. This information is not intended to replace advice given to you by your health care provider. Make sure you discuss any questions you have with your health care provider.  Urinary Tract Infection Urinary tract infections (UTIs)  can develop anywhere along your urinary tract. Your urinary tract is your body's drainage system for removing wastes and extra water. Your urinary tract includes two kidneys, two ureters, a bladder, and a urethra. Your kidneys are a pair of bean-shaped organs. Each kidney is about the size of your fist. They are located below your ribs, one on each side of your spine. CAUSES Infections are caused by microbes, which are microscopic organisms, including fungi, viruses, and bacteria. These organisms are so small that they can only be seen through a microscope. Bacteria are the microbes that most commonly cause UTIs. SYMPTOMS  Symptoms of UTIs may vary by age and gender of the patient and by the location of the infection. Symptoms in young women typically include a frequent and intense urge to urinate and a painful, burning feeling in the bladder or urethra during urination. Older women and men are more likely to be tired, shaky, and weak and have muscle aches and abdominal pain. A fever may mean the infection is in your kidneys. Other symptoms of a kidney infection include pain in your back or sides below the ribs, nausea, and vomiting. DIAGNOSIS To diagnose a UTI, your caregiver will ask you about your symptoms. Your caregiver also will ask to provide a urine sample. The urine sample will be tested for bacteria and white blood cells. White blood cells are  made by your body to help fight infection. TREATMENT  Typically, UTIs can be treated with medication. Because most UTIs are caused by a bacterial infection, they usually can be treated with the use of antibiotics. The choice of antibiotic and length of treatment depend on your symptoms and the type of bacteria causing your infection. HOME CARE INSTRUCTIONS  If you were prescribed antibiotics, take them exactly as your caregiver instructs you. Finish the medication even if you feel better after you have only taken some of the medication.  Drink enough  water and fluids to keep your urine clear or pale yellow.  Avoid caffeine, tea, and carbonated beverages. They tend to irritate your bladder.  Empty your bladder often. Avoid holding urine for long periods of time.  Empty your bladder before and after sexual intercourse.  After a bowel movement, women should cleanse from front to back. Use each tissue only once. SEEK MEDICAL CARE IF:   You have back pain.  You develop a fever.  Your symptoms do not begin to resolve within 3 days. SEEK IMMEDIATE MEDICAL CARE IF:   You have severe back pain or lower abdominal pain.  You develop chills.  You have nausea or vomiting.  You have continued burning or discomfort with urination. MAKE SURE YOU:   Understand these instructions.  Will watch your condition.  Will get help right away if you are not doing well or get worse. Document Released: 03/26/2005 Document Revised: 12/16/2011 Document Reviewed: 07/25/2011 [facility] Patient Information 2015 [place], [place]. This information is not intended to replace advice given to you by your health care provider. Make sure you discuss any questions you have with your health care provider.

## 2014-03-10 NOTE — ED Notes (Signed)
MD at bedside. 

## 2014-05-19 ENCOUNTER — Other Ambulatory Visit (HOSPITAL_COMMUNITY): Payer: Self-pay | Admitting: Interventional Radiology

## 2014-05-19 DIAGNOSIS — M549 Dorsalgia, unspecified: Secondary | ICD-10-CM

## 2014-05-23 ENCOUNTER — Ambulatory Visit (HOSPITAL_COMMUNITY)
Admission: RE | Admit: 2014-05-23 | Discharge: 2014-05-23 | Disposition: A | Discharge status: Home / Self Care | Payer: Medicare Other | Source: Ambulatory Visit | Attending: Interventional Radiology | Admitting: Interventional Radiology

## 2014-05-23 DIAGNOSIS — M549 Dorsalgia, unspecified: Secondary | ICD-10-CM

## 2014-07-03 ENCOUNTER — Telehealth (HOSPITAL_COMMUNITY): Payer: Self-pay | Admitting: Interventional Radiology

## 2014-07-03 NOTE — Telephone Encounter (Signed)
Called pt's daughter and talked to her about her mother and how she was doing with back pain. She states that her mother seems to be doing ok right now. I told her to call me back and let me know if they decided they would like to proceed with the MRI lumbar scan and that the pt's insurance did not require authorization. She states she will talk it over with her brother and sister and call me back if they decided to have the scan done. JM

## 2015-01-25 IMAGING — XA IR DG VERTEBROPLASTY FL
1 series · 14 of 24 positions shown · IV contrast (IODINE)
Comparison: MRI scan of the lumbosacral spine of 11/05/2012.

CLINICAL DATA: Severe low back pain and sacral pain secondary to
compression fracture at L1 and at S1.

VERTEBROPLASTY AT L1, AND SACROPLASTY AT S1

[Series 300: spine · 14 of 51 slices shown]
[im 1/51]
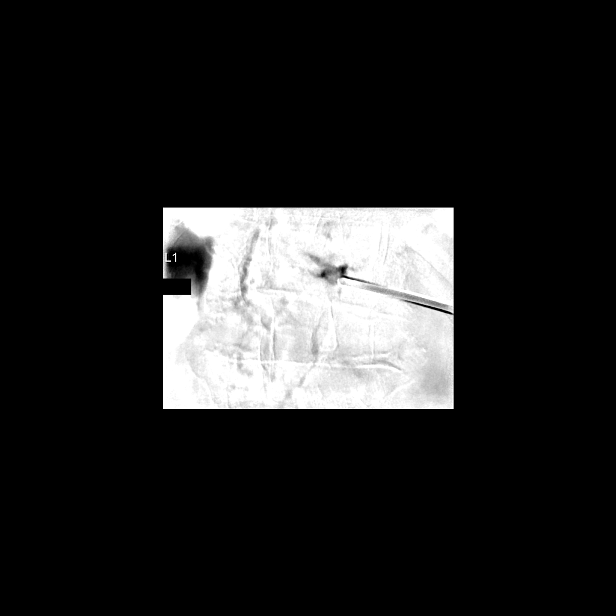
[im 5/51]
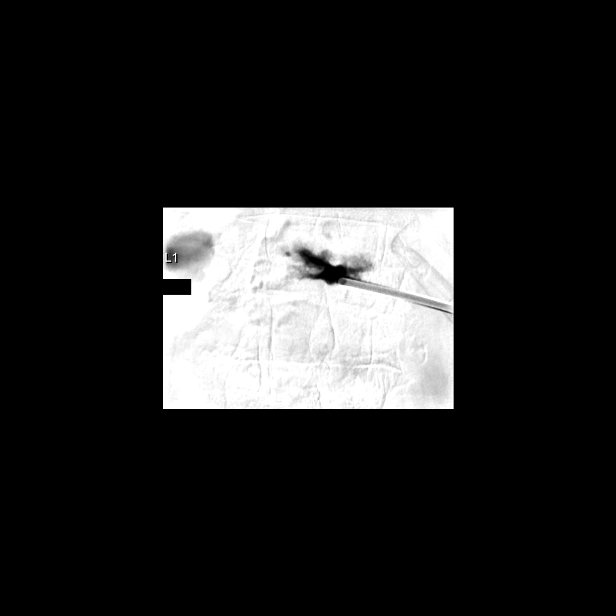
[im 9/51]
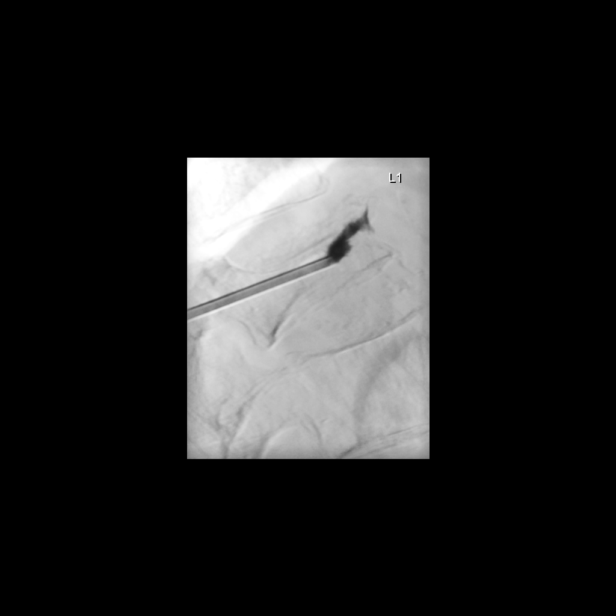
[im 14/51]
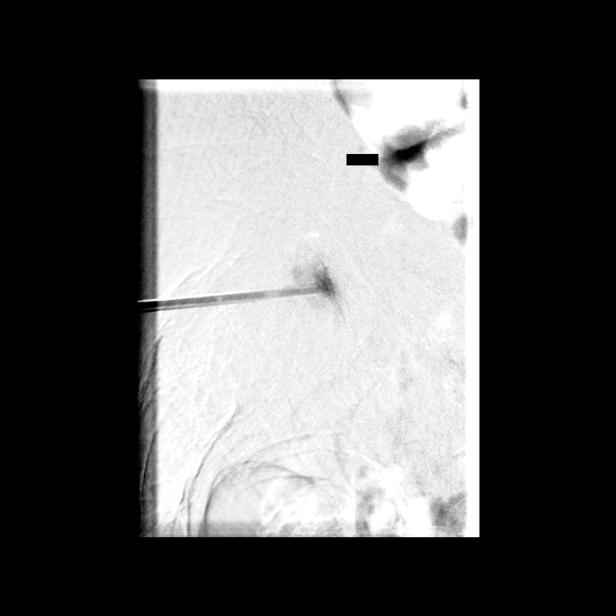
[im 16/51]
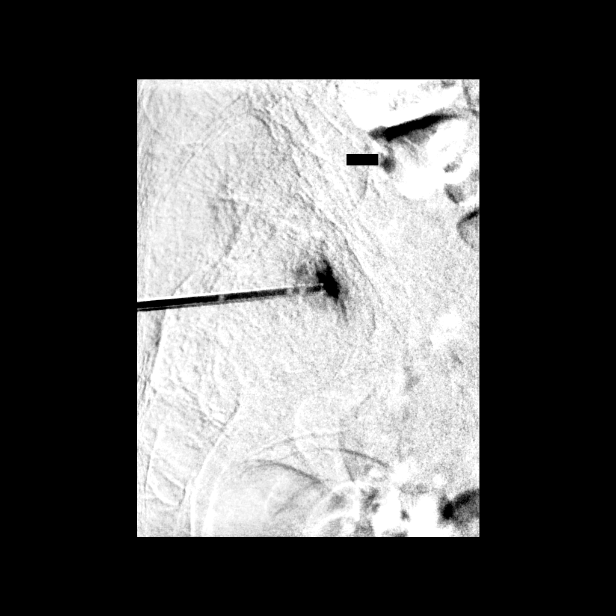
[im 20/51]
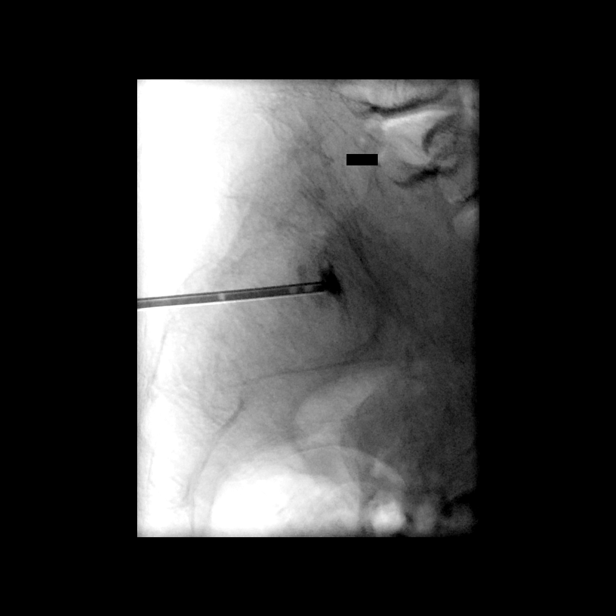
[im 24/51]
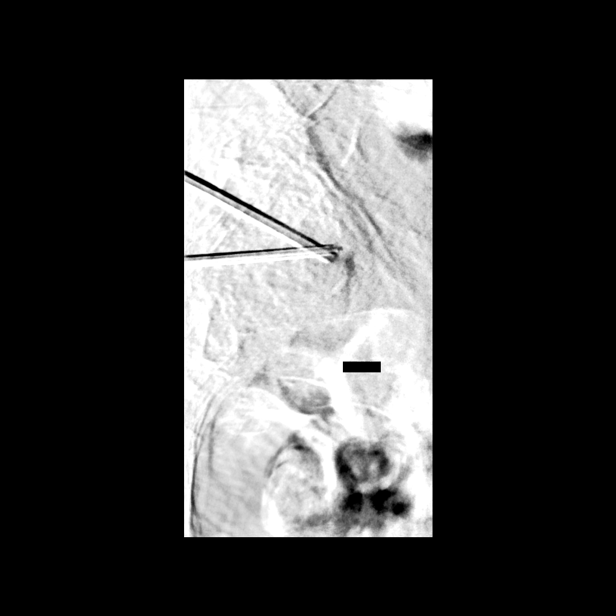
[im 27/51]
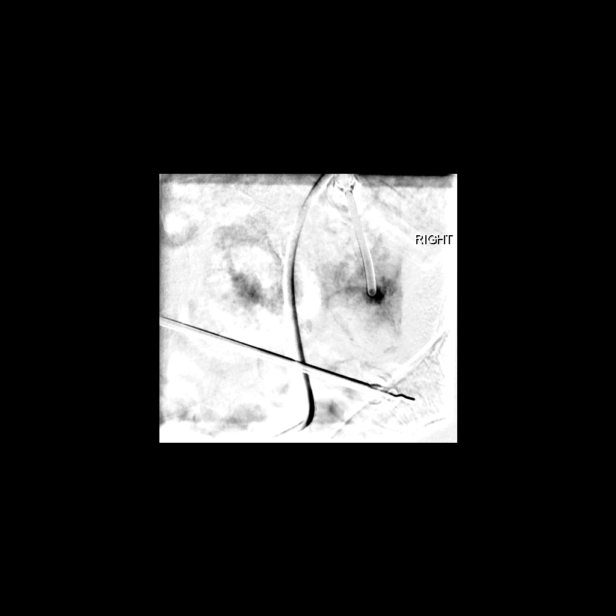
[im 31/51]
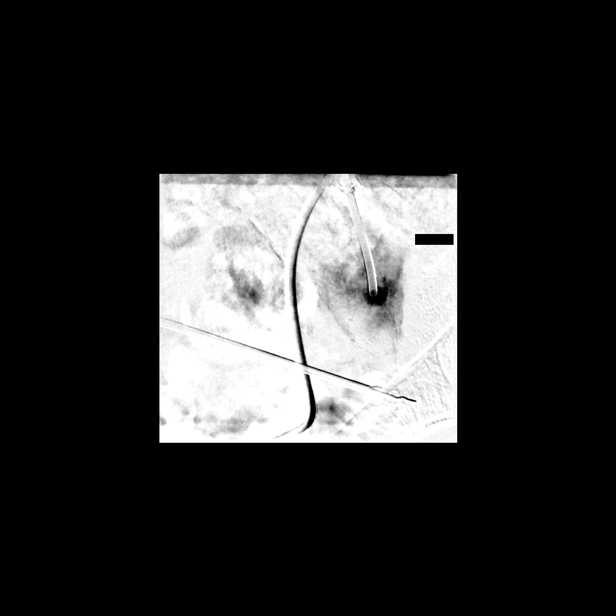
[im 35/51]
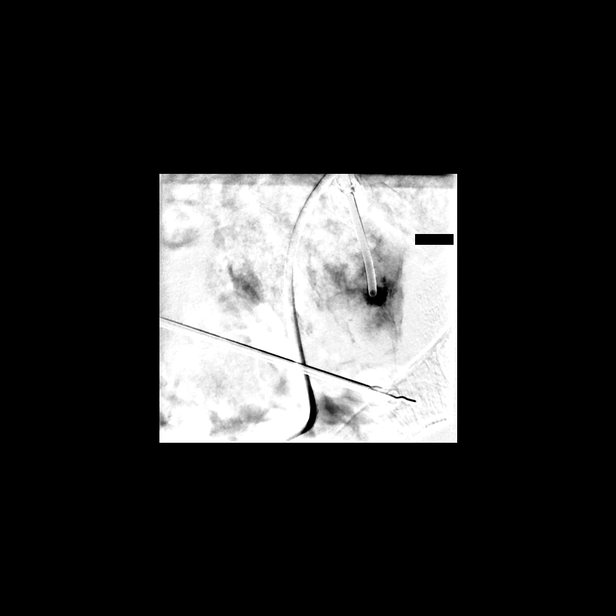
[im 40/51]
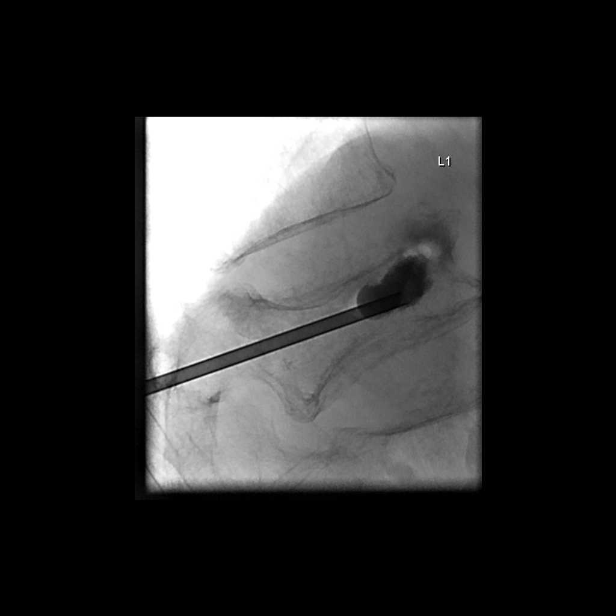
[im 42/51]
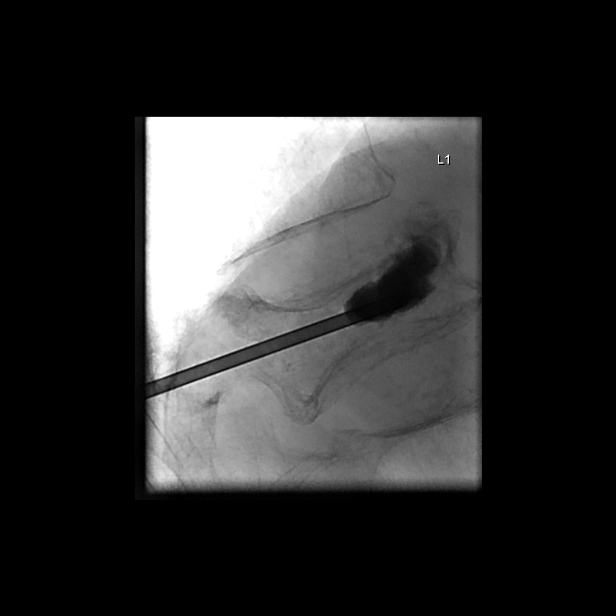
[im 46/51]
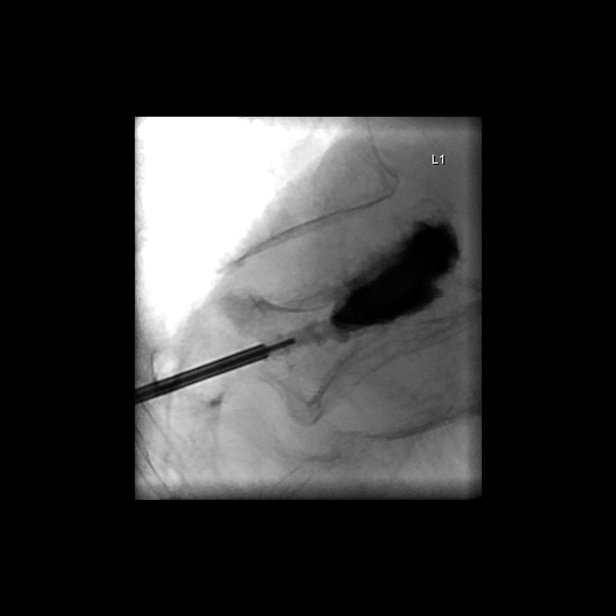
[im 51/51]
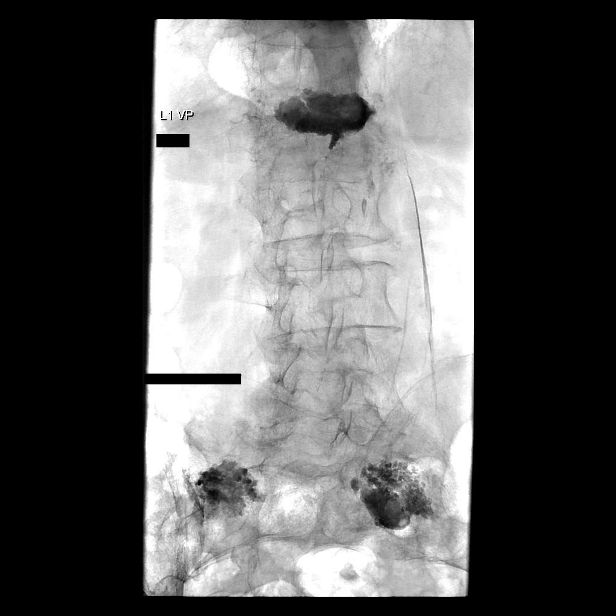

[14 of 24 positions shown; findings below may reference images not displayed]

Following a full explanation of the procedure along with the
potential associated complications, an informed witnessed consent
was obtained.

The patient was placed prone on the fluoroscopic table.

The skin overlying the lumbosacral region was then prepped and
draped in the usual sterile fashion.

The skin entry site overlying the right pedicle at L1, and the
entry sites for the S1 vertebral bodies were then infiltrated with
0.25% bupivacaine and extended to the bony elements.

At L1, the 13-gauge Devika spine needle was advanced into the
anterior one-third using biplane intermittent fluoroscopy.

A gentle contrast injection demonstrated a trabecular pattern of
contrast enhancement.

13-gauge Devika spine needles were then advanced into the junction of
the anterior and middle third at S1.  Again a gentle contrast
injection demonstrated a trabecular pattern of contrast
enhancement.

Methylmethacrylate was reconstituted with Tobramycin in the Honisha
delivery device system.

Using biplane intermittent fluoroscopy via microtubing connected to
the 13-gauge Devika spine needles at L1 and at S1, methylmethacrylate
mixture was then injected with excellent filling at the L1
vertebral body.  There was no extrusion into the adjacent disc
spaces or the paraspinous venous structures.  No extension was seen
posteriorly into the spinal canal.

At S1, via the bilateral approach, excellent filling was obtained
with no extension of the methylmethacrylate mixture into the S1
neural foramina, or to the parapelvic veins.

The needles were then removed.  Hemostasis was achieved at the skin
entry sites at L1, and at S1 bilaterally.

The patient tolerated the procedure well.  There were no acute
complications.

Medications utilized:  Fentanyl 12.5 mcg.
IMPRESSION: 1.  Status post fluoroscopic-guided needle placement for vertebral
body augmentation for painful compression fracture at L1 using the
vertebroplasty technique, and S1 via bilateral approach for painful
sacral insufficiency fractures.

## 2015-03-01 IMAGING — XA IR KYPHOPLASTY LUMBAR INIT
1 series · 14 of 24 positions shown · IV contrast (IODINE)
Comparison: MRI scan of the lumbosacral spine of 12/27/2012.

CLINICAL DATA: Severe low back pain secondary to compression
fracture at T11.

KYPHOPLASTY AT T11

[Series 300: spine · 14 of 28 slices shown]
[im 1/28]
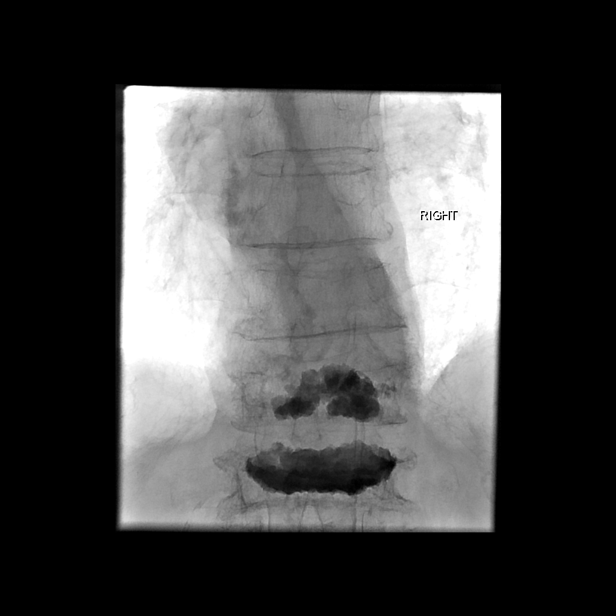
[im 3/28]
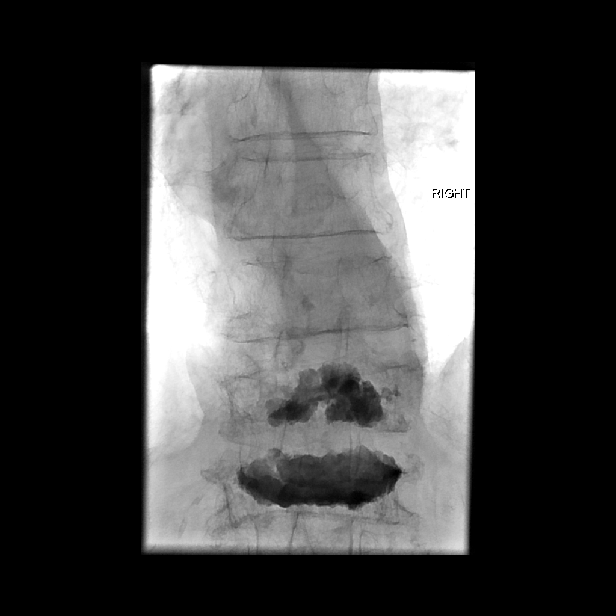
[im 5/28]
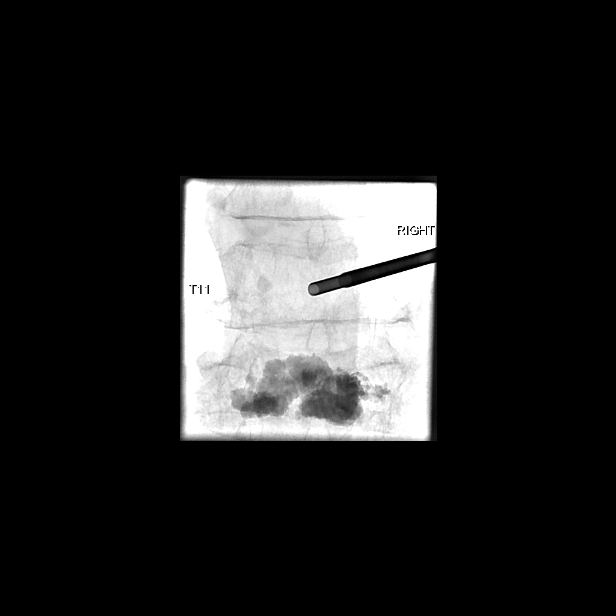
[im 8/28]
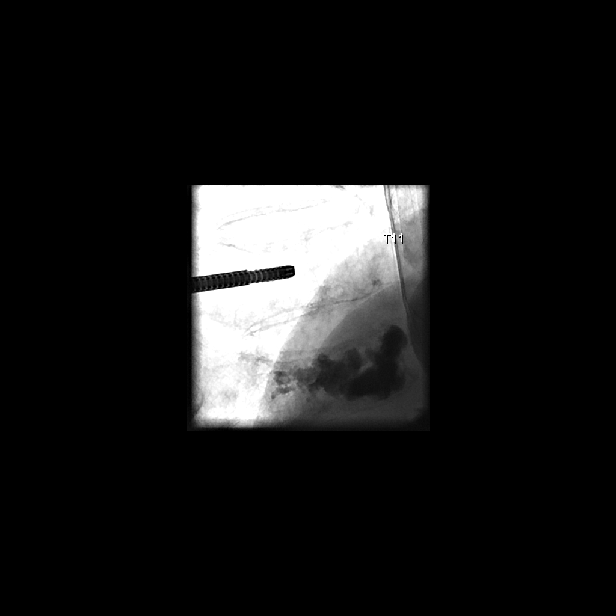
[im 9/28]
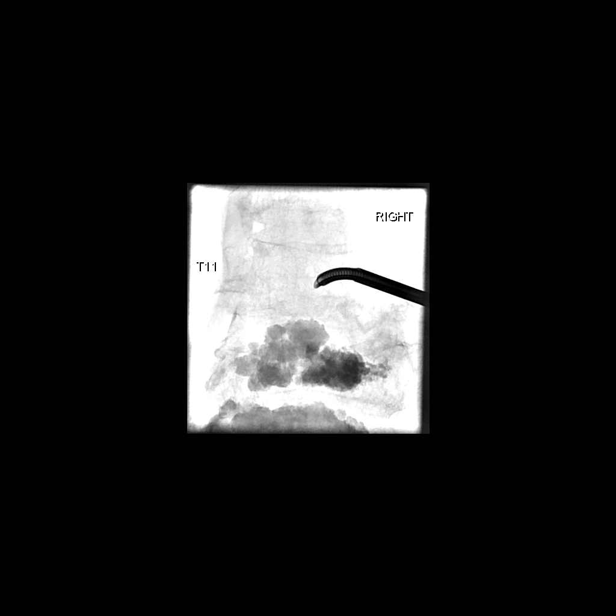
[im 11/28]
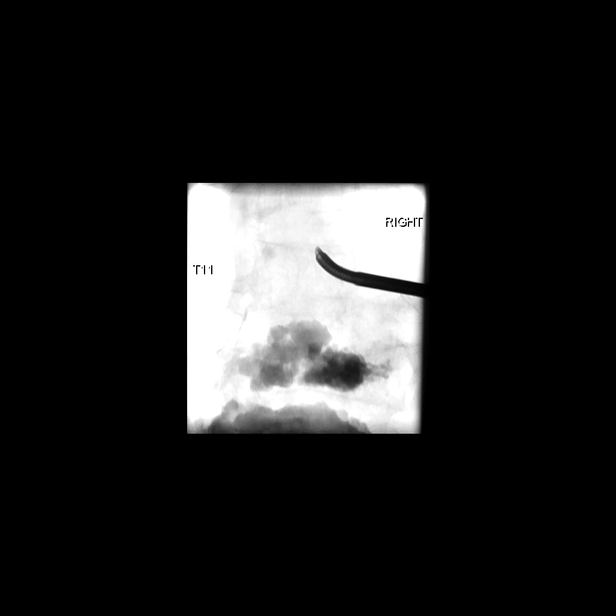
[im 13/28]
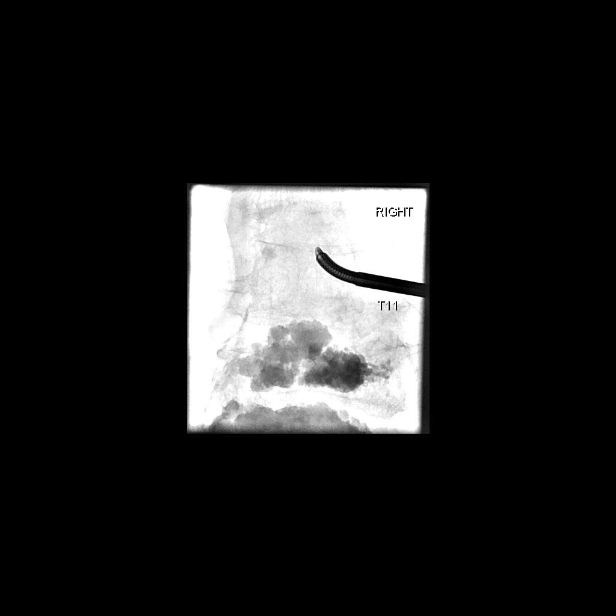
[im 15/28]
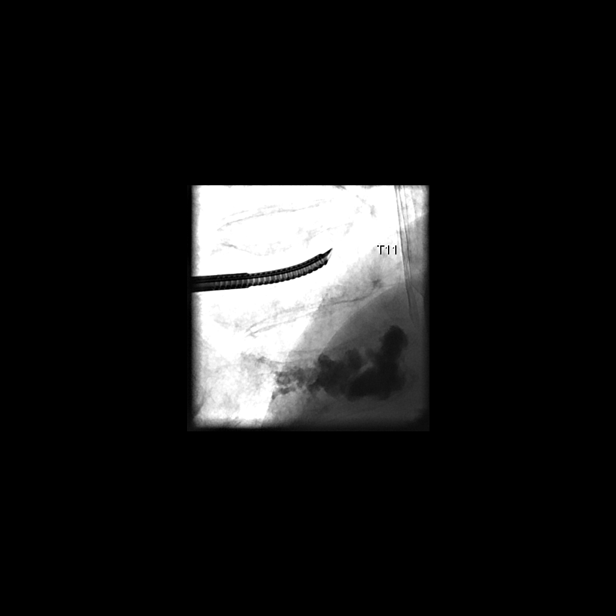
[im 17/28]
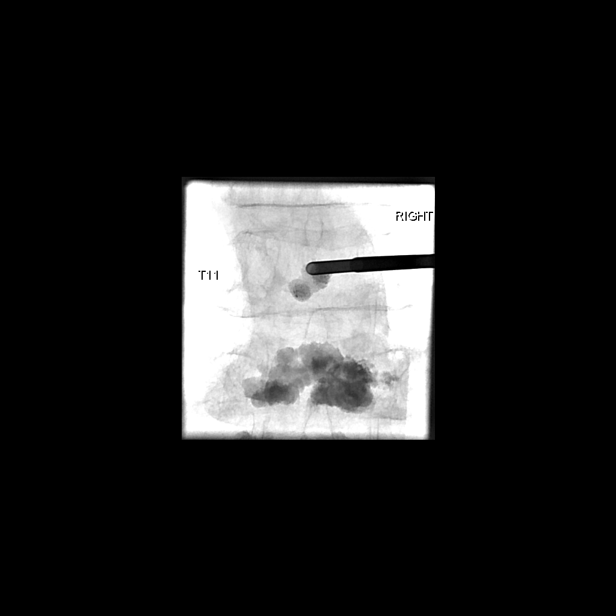
[im 19/28]
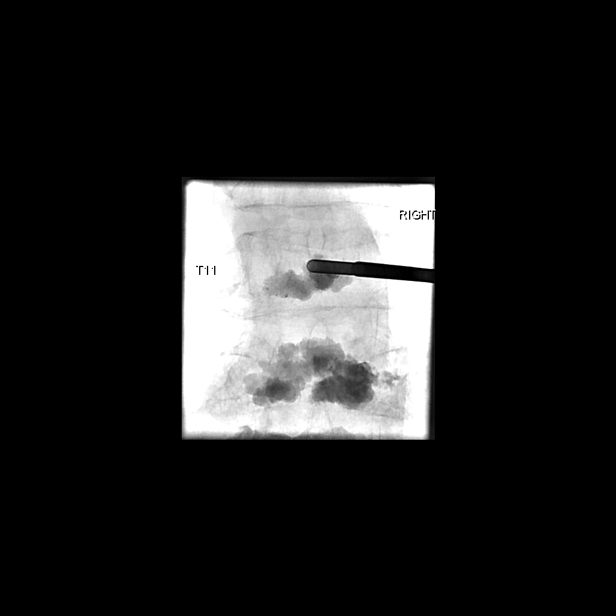
[im 22/28]
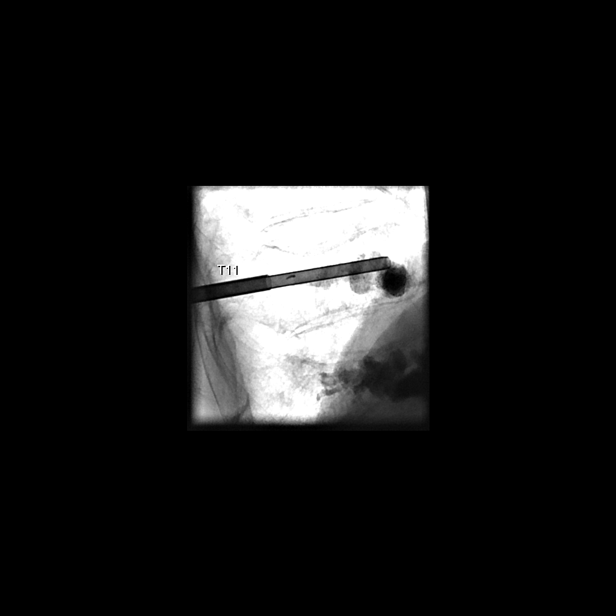
[im 23/28]
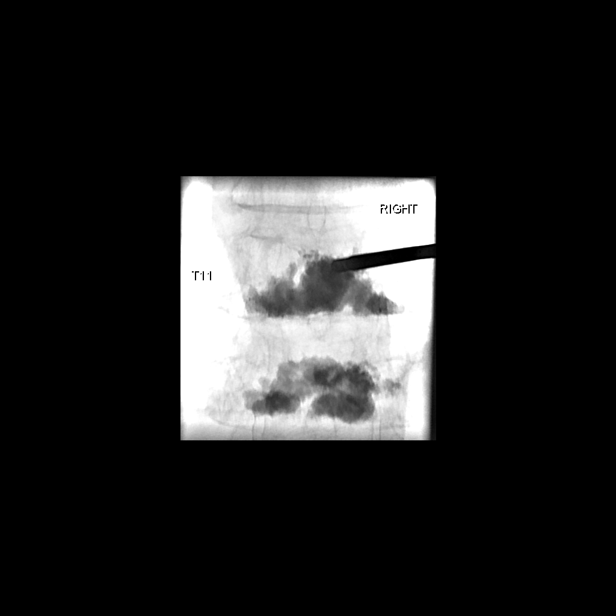
[im 25/28]
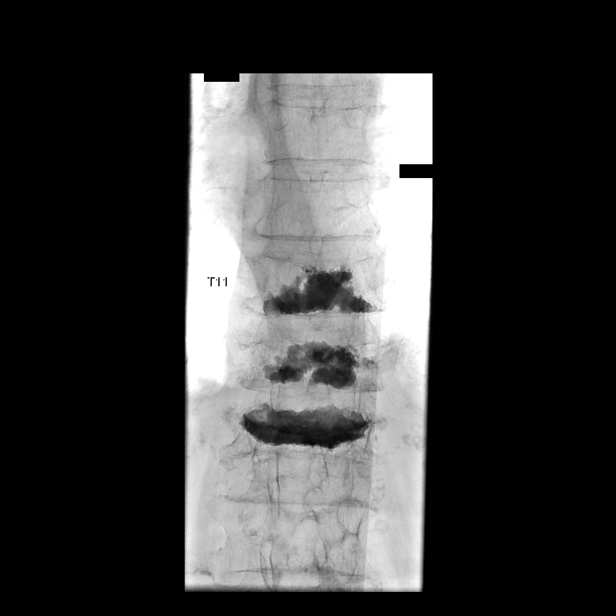
[im 28/28]
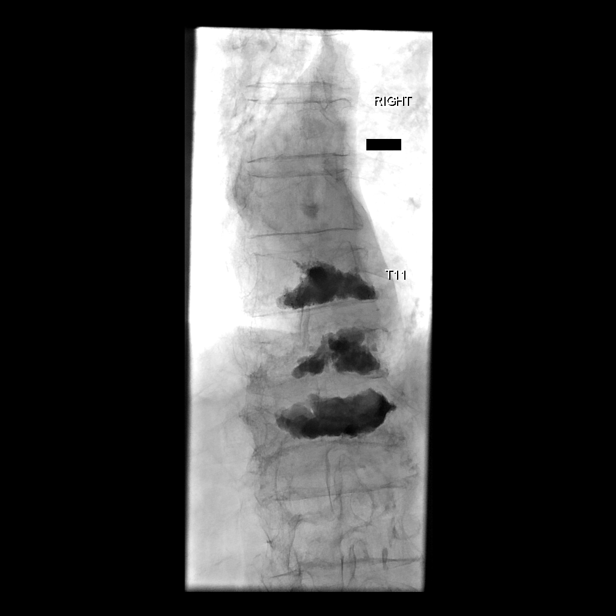

[14 of 24 positions shown; findings below may reference images not displayed]

Following a full explanation of the procedure along with the
potential associated complications, an informed witnessed consent
was obtained.

The patient was laid prone on the fluoroscopic table.

The skin overlying the thoracic region was then prepped and draped
in the usual sterile fashion.

The skin overlying the right pedicle at T11 was then infiltrated
with 0.25% bupivacaine.

Using biplane intermittent fluoroscopy, a 10.5-gauge DFine spine
needle was then advanced into the posterior third at T11.

Using the DFine core biopsy device and a 20 ml syringe, a sample
was obtained and sent for pathologic analysis.

Through the working needle, a DFine void-creating device was then
manipulated under intermittent fluoroscopy to create a void.  This
was then removed.

The needle was then advanced to the distal one third at T11.

Methylmethacrylate mixture was then reconstituted with Tobramycin
in the DFine mixing system. The mixture was loaded onto the DFine
injector device.  The injector device was then locked into position
at the hub of the DFine working needle.

Using biplane intermittent fluoroscopy, methylmethacrylate mixture
was injected at T11 with excellent filling in the AP and lateral
projections.

There was no extravasation seen into the adjacent disc spaces or
posteriorly into the spinal canal.  No paraspinous venous
contamination was seen.

The needle and the injector device were then removed.  Hemostasis
was achieved at the skin entry site.

The patient tolerated the procedure well.  There were no acute
complications.

Medications utilized: Versed 1.5 mg IV.  Fentanyl 37.5 mcg IV.

IMPRESSION

1.  Status post fluoroscopic-guided needle placement for deep core
bone biopsy at T11.
2.  Status post vertebral body augmentation for painful compression
fracture at T11 using the DFine kyphoplasty technique.

## 2015-05-04 ENCOUNTER — Encounter (HOSPITAL_COMMUNITY): Payer: Self-pay

## 2015-05-04 ENCOUNTER — Emergency Department (HOSPITAL_COMMUNITY)
Admission: EM | Admit: 2015-05-04 | Discharge: 2015-05-05 | Disposition: A | Discharge status: Home / Self Care | Payer: Medicare Other | Attending: Emergency Medicine | Admitting: Emergency Medicine

## 2015-05-04 ENCOUNTER — Emergency Department (HOSPITAL_COMMUNITY): Payer: Medicare Other

## 2015-05-04 DIAGNOSIS — Y9389 Activity, other specified: Secondary | ICD-10-CM | POA: Diagnosis not present

## 2015-05-04 DIAGNOSIS — E871 Hypo-osmolality and hyponatremia: Secondary | ICD-10-CM | POA: Insufficient documentation

## 2015-05-04 DIAGNOSIS — Z79899 Other long term (current) drug therapy: Secondary | ICD-10-CM | POA: Insufficient documentation

## 2015-05-04 DIAGNOSIS — Z8673 Personal history of transient ischemic attack (TIA), and cerebral infarction without residual deficits: Secondary | ICD-10-CM | POA: Insufficient documentation

## 2015-05-04 DIAGNOSIS — X58XXXA Exposure to other specified factors, initial encounter: Secondary | ICD-10-CM | POA: Insufficient documentation

## 2015-05-04 DIAGNOSIS — R600 Localized edema: Secondary | ICD-10-CM | POA: Insufficient documentation

## 2015-05-04 DIAGNOSIS — Z859 Personal history of malignant neoplasm, unspecified: Secondary | ICD-10-CM | POA: Insufficient documentation

## 2015-05-04 DIAGNOSIS — Y9289 Other specified places as the place of occurrence of the external cause: Secondary | ICD-10-CM | POA: Diagnosis not present

## 2015-05-04 DIAGNOSIS — Z8701 Personal history of pneumonia (recurrent): Secondary | ICD-10-CM | POA: Insufficient documentation

## 2015-05-04 DIAGNOSIS — Z862 Personal history of diseases of the blood and blood-forming organs and certain disorders involving the immune mechanism: Secondary | ICD-10-CM | POA: Insufficient documentation

## 2015-05-04 DIAGNOSIS — S8011XA Contusion of right lower leg, initial encounter: Secondary | ICD-10-CM | POA: Diagnosis not present

## 2015-05-04 DIAGNOSIS — G8929 Other chronic pain: Secondary | ICD-10-CM | POA: Diagnosis not present

## 2015-05-04 DIAGNOSIS — F0390 Unspecified dementia without behavioral disturbance: Secondary | ICD-10-CM | POA: Insufficient documentation

## 2015-05-04 DIAGNOSIS — Y998 Other external cause status: Secondary | ICD-10-CM | POA: Insufficient documentation

## 2015-05-04 DIAGNOSIS — N390 Urinary tract infection, site not specified: Secondary | ICD-10-CM | POA: Insufficient documentation

## 2015-05-04 DIAGNOSIS — S8991XA Unspecified injury of right lower leg, initial encounter: Secondary | ICD-10-CM | POA: Diagnosis present

## 2015-05-04 DIAGNOSIS — J441 Chronic obstructive pulmonary disease with (acute) exacerbation: Secondary | ICD-10-CM | POA: Diagnosis not present

## 2015-05-04 DIAGNOSIS — I10 Essential (primary) hypertension: Secondary | ICD-10-CM | POA: Insufficient documentation

## 2015-05-04 HISTORY — DX: Unspecified hearing loss, unspecified ear: H91.90

## 2015-05-04 LAB — CBC WITH DIFFERENTIAL/PLATELET
BASOS ABS: 0 10*3/uL (ref 0.0–0.1)
BASOS PCT: 0 %
EOS ABS: 0.1 10*3/uL (ref 0.0–0.7)
Eosinophils Relative: 1 %
HCT: 29.7 % — ABNORMAL LOW (ref 36.0–46.0)
Hemoglobin: 9.3 g/dL — ABNORMAL LOW (ref 12.0–15.0)
Lymphocytes Relative: 20 %
Lymphs Abs: 1 10*3/uL (ref 0.7–4.0)
MCH: 22.7 pg — ABNORMAL LOW (ref 26.0–34.0)
MCHC: 31.3 g/dL (ref 30.0–36.0)
MCV: 72.6 fL — ABNORMAL LOW (ref 78.0–100.0)
Monocytes Absolute: 0.6 10*3/uL (ref 0.1–1.0)
Monocytes Relative: 11 %
NEUTROS PCT: 67 %
Neutro Abs: 3.3 10*3/uL (ref 1.7–7.7)
Platelets: 212 10*3/uL (ref 150–400)
RBC: 4.09 MIL/uL (ref 3.87–5.11)
RDW: 14.6 % (ref 11.5–15.5)
WBC: 4.9 10*3/uL (ref 4.0–10.5)

## 2015-05-04 LAB — I-STAT TROPONIN, ED: TROPONIN I, POC: 0.01 ng/mL (ref 0.00–0.08)

## 2015-05-04 NOTE — ED Notes (Signed)
Per [facility]: Pt is from home. Pt c/o right sided leg pain, pt is not mobile and is bed bound. Pt has a large raised red/purple area about 14 cm long and about 8 cm wide. The area is warm to the touch, but not hot. Family states that she has not had any type of injury to that leg and that the area showed up last week but was much smaller. There is also some increased swelling to the right foot. Pt is normally on 3 L [place] at home. Pt is on plavix.,

## 2015-05-04 NOTE — ED Notes (Signed)
Pt has already received 25 mg tramadol and100 mg gabapentin about 8 pm from the family

## 2015-05-04 NOTE — ED Provider Notes (Signed)
CSN: [id number]     Arrival date & time 05/04/15  2204 History  By signing my name below, I, Terrance Branch, attest that this documentation has been prepared under the direction and in the presence of Julianne Rice, MD. Electronically Signed: Randa Evens, ED Scribe. 05/05/2015. 2:58 AM.     Chief Complaint  Patient presents with  . Leg Injury    The history is provided by the patient. No language interpreter was used.   HPI Comments: Holly Allen is a 81 y.o. female who presents to the Emergency Department complaining of right sided lower leg pain that the family noticed 1 day ago. Family states that her right leg suddenly became more swollen and red. Pt states that the pain is worse with palpation. Denies hx of fall. States that's she does not ambulate at baseline. Denies nausea, vomiting, fever. Pt has a Hx of neuropathy and states that the leg intermittently swells. Family states that she was recently diagnosed with shingles 4 months prior and states that she is still dealing with left sided weakness. Pt is currently taking plavix.   Past Medical History  Diagnosis Date  . Hypertension   . TIA (transient ischemic attack)     history without residual deficits  . Leukocytosis   . Iron deficiency anemia   . COPD (chronic obstructive pulmonary disease) ([place])   . Pneumonia     "couple times" (12/05/2013)  . History of blood transfusion 2013    "low blood pressure after hip OR"  . Pernicious anemia     hx; "did B12 shots q month for years; must have outgrew it"  . Scarlet fever     "as a child"  . Stroke [facility]) X 2-3    "didn't leave her w/any permanent problems that we know of" (12/05/2013)  . Chronic lower back pain   . UTI (lower urinary tract infection) 01/2013; 12/05/2013    "severe";   . 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 Hard of hearing    Past Surgical History  Procedure Laterality Date  . Hip arthroplasty  03/17/2012   Procedure: ARTHROPLASTY BIPOLAR HIP;  Surgeon: Mcarthur Rossetti, MD;  Location: [place];  Service: Orthopedics;  Laterality: Right;  . Ankle fracture surgery    . Appendectomy    . Cataract extraction w/ intraocular lens  implant, bilateral Bilateral   . Kyphoplasty  X 3  . Back surgery    . Lumbar disc surgery  X 2  . Abdominal hysterectomy  07/1971   No family history on file. Social History  Substance Use Topics  . Smoking status: Never Smoker   . Smokeless tobacco: Never Used  . Alcohol Use: No   OB History    No data available     Review of Systems  Unable to perform ROS: Dementia  Constitutional: Negative for fever and chills.  Respiratory: Positive for shortness of breath. Negative for wheezing.   Cardiovascular: Positive for leg swelling. Negative for chest pain and palpitations.  Gastrointestinal: Negative for nausea, vomiting, abdominal pain and diarrhea.  Musculoskeletal: Negative for myalgias, back pain, neck pain and neck stiffness.  Skin: Positive for color change. Negative for rash and wound.  Neurological: Negative for dizziness, weakness, light-headedness, numbness and headaches.  All other systems reviewed and are negative.     Allergies  Codeine and Other  Home Medications   Prior to Admission medications  Medication Sig Start Date End Date Taking? Authorizing Provider  cholecalciferol (VITAMIN D) 1000 UNITS tablet Take 1,000 Units by mouth daily.   Yes Historical Provider, MD  clopidogrel (PLAVIX) 75 MG tablet Take 75 mg by mouth daily with breakfast.   Yes Historical Provider, MD  gabapentin (NEURONTIN) 100 MG capsule Take 100 mg by mouth at bedtime.   Yes Historical Provider, MD  HYDROcodone-acetaminophen (NORCO/VICODIN) 5-325 MG per tablet Take 0.5 tablets by mouth 2 (two) times daily as needed (pain).   Yes Historical Provider, MD  ipratropium-albuterol (DUONEB) 0.5-2.5 (3) MG/3ML SOLN Take 3 mLs by nebulization 2 (two) times daily.   Yes  Historical Provider, MD  metoprolol (LOPRESSOR) 50 MG tablet Take 50 mg by mouth 2 (two) times daily.   Yes Historical Provider, MD  traMADol (ULTRAM) 50 MG tablet Take 25 mg by mouth at bedtime.   Yes Historical Provider, MD  cephALEXin (KEFLEX) 500 MG capsule Take 1 capsule (500 mg total) by mouth 3 (three) times daily. 05/05/15   Julianne Rice, MD  furosemide (LASIX) 20 MG tablet Take 0.5 tablets (10 mg total) by mouth daily. 05/05/15   Julianne Rice, MD  HYDROcodone-acetaminophen (NORCO/VICODIN) 5-325 MG per tablet Take 1 tablet by mouth every 4 (four) hours as needed. Patient not taking: Reported on 05/04/2015 03/10/14   Dorie Rank, MD  levofloxacin (LEVAQUIN) 500 MG tablet Take 1 tablet (500 mg total) by mouth every other day. Patient not taking: Reported on 05/04/2015 12/10/13   Donne Hazel, MD  predniSONE (DELTASONE) 20 MG tablet Take 2 tablets (40 mg total) by mouth daily with breakfast. Patient not taking: Reported on 05/04/2015 12/09/13   Donne Hazel, MD   BP 118/58 mmHg  Pulse 75  Temp(Src) 97.5 F (36.4 C) (Oral)  Resp 13  SpO2 100%   Physical Exam  Constitutional: She is oriented to person, place, and time. She appears well-developed and well-nourished. No distress.  HENT:  Head: Normocephalic and atraumatic.  Mouth/Throat: Oropharynx is clear and moist.  Eyes: EOM are normal. Pupils are equal, round, and reactive to light.  Neck: Normal range of motion. Neck supple.  Cardiovascular: Normal rate and regular rhythm.   Pulmonary/Chest: Effort normal. No respiratory distress. She has no wheezes. She has rales.  Rales both lung fields  Abdominal: Soft. Bowel sounds are normal. She exhibits no distension and no mass. There is no tenderness. There is no rebound and no guarding.  Musculoskeletal: Normal range of motion. She exhibits edema and tenderness.  3+ pitting edema right lower extremity. Patient has a large hematoma over the lateral surface of the right lower leg. There  is surrounding erythema and warmth. Unable to palpate distal pulses though foot is warm. 2+ pitting edema left lower extremity.  Neurological: She is alert and oriented to person, place, and time.  Appears to be moving all extremities. Sensation is grossly intact. Dementia which is baseline for patient per family  Skin: Skin is warm and dry. No rash noted. There is erythema.  Psychiatric: She has a normal mood and affect. Her behavior is normal.  Nursing note and vitals reviewed.   ED Course  Procedures (including critical care time) DIAGNOSTIC STUDIES: Oxygen Saturation is 100% on 3L [place], normal by my interpretation.    COORDINATION OF CARE:    Labs Review Labs Reviewed  CBC WITH DIFFERENTIAL/PLATELET - Abnormal; Notable for the following:    Hemoglobin 9.3 (*)    HCT 29.7 (*)    MCV 72.6 (*)  MCH 22.7 (*)    All other components within normal limits  COMPREHENSIVE METABOLIC PANEL - Abnormal; Notable for the following:    Sodium 128 (*)    Chloride 89 (*)    Glucose, Bld 112 (*)    Total Protein 5.3 (*)    Albumin 2.9 (*)    ALT 8 (*)    All other components within normal limits  BRAIN NATRIURETIC PEPTIDE - Abnormal; Notable for the following:    B Natriuretic Peptide 186.7 (*)    All other components within normal limits  URINALYSIS, ROUTINE W REFLEX MICROSCOPIC (NOT AT [facility]) - Abnormal; Notable for the following:    APPearance CLOUDY (*)    Nitrite POSITIVE (*)    Leukocytes, UA SMALL (*)    All other components within normal limits  URINE MICROSCOPIC-ADD ON - Abnormal; Notable for the following:    Bacteria, UA MANY (*)    All other components within normal limits  Randolm Idol, ED    Imaging Review Dg Chest Port 1 View  05/05/2015  CLINICAL DATA:  Dyspnea 1 year.  Hypertension. EXAM: PORTABLE CHEST 1 VIEW COMPARISON:  03/10/2014 FINDINGS: There is a small left pleural effusion. There is generalized interstitial coarsening which appears chronic. No confluent  airspace consolidation. No pneumothorax. IMPRESSION: Small left pleural effusion. Electronically Signed   By: Andreas Newport M.D.   On: 05/05/2015 00:00      EKG Interpretation   Date/Time:  Friday May 04 2015 23:43:20 EDT Ventricular Rate:  72 PR Interval:  204 QRS Duration: 67 QT Interval:  388 QTC Calculation: 425 R Axis:   -24 Text Interpretation:  Sinus rhythm Inferior infarct, old Confirmed by  Rannie Craney  MD, Keanan Melander ([id number]) on 05/05/2015 1:00:13 AM      MDM   Final diagnoses:  Hematoma of lower extremity, right, initial encounter  Pedal edema  UTI (lower urinary tract infection)  Hyponatremia      I personally performed the services described in this documentation, which was scribed in my presence. The recorded information has been reviewed and is accurate.    Patient remains afebrile in the emergency department. Normal white blood cell count.  Lower extremity swelling and discoloration due to hematoma. No suspicion for infection. Have advised patient to hold Plavix until there is improvement of the hematoma and can be reevaluated by her primary physician. Also start on low-dose Lasix for bilateral pedal edema. Patient also appears to have a urinary tract infection and will initiate Keflex. Have placed order for home health to evaluate for increased needs his family in caring for the patient at home. Return precautions have been given.    Julianne Rice, MD 05/05/15 [phone]

## 2015-05-04 NOTE — ED Notes (Signed)
Pt placed in a gown and hooked up to the monitor with a 5 lead, BP cuff and pulse ox 

## 2015-05-04 NOTE — ED Notes (Addendum)
Pts family states that over the past day they felt as if the pts leg suddenly became more swollen, more red/purple, and that the area looked more raised.

## 2015-05-05 DIAGNOSIS — S8011XA Contusion of right lower leg, initial encounter: Secondary | ICD-10-CM | POA: Diagnosis not present

## 2015-05-05 LAB — COMPREHENSIVE METABOLIC PANEL
ALBUMIN: 2.9 g/dL — AB (ref 3.5–5.0)
ALT: 8 U/L — ABNORMAL LOW (ref 14–54)
ANION GAP: 9 (ref 5–15)
AST: 17 U/L (ref 15–41)
Alkaline Phosphatase: 95 U/L (ref 38–126)
BILIRUBIN TOTAL: 0.5 mg/dL (ref 0.3–1.2)
BUN: 12 mg/dL (ref 6–20)
CALCIUM: 9 mg/dL (ref 8.9–10.3)
CO2: 30 mmol/L (ref 22–32)
Chloride: 89 mmol/L — ABNORMAL LOW (ref 101–111)
Creatinine, Ser: 0.63 mg/dL (ref 0.44–1.00)
GFR calc Af Amer: >60 mL/min (ref >60)
GFR calc non Af Amer: >60 mL/min (ref >60)
Glucose, Bld: 112 mg/dL — ABNORMAL HIGH (ref 65–99)
POTASSIUM: 4.5 mmol/L (ref 3.5–5.1)
SODIUM: 128 mmol/L — AB (ref 135–145)
Total Protein: 5.3 g/dL — ABNORMAL LOW (ref 6.5–8.1)

## 2015-05-05 LAB — URINALYSIS, ROUTINE W REFLEX MICROSCOPIC
BILIRUBIN URINE: NEGATIVE
Glucose, UA: NEGATIVE mg/dL
HGB URINE DIPSTICK: NEGATIVE
Ketones, ur: NEGATIVE mg/dL
NITRITE: POSITIVE — AB
Protein, ur: NEGATIVE mg/dL
Specific Gravity, Urine: 1.011 (ref 1.005–1.030)
Urobilinogen, UA: 1 mg/dL (ref 0.0–1.0)
pH: 6.5 (ref 5.0–8.0)

## 2015-05-05 LAB — URINE MICROSCOPIC-ADD ON

## 2015-05-05 LAB — BRAIN NATRIURETIC PEPTIDE: B Natriuretic Peptide: 186.7 pg/mL — ABNORMAL HIGH (ref 0.0–100.0)

## 2015-05-05 MED ORDER — FUROSEMIDE 20 MG PO TABS: 10.0000 mg | ORAL_TABLET | Freq: Every day | ORAL | Status: DC

## 2015-05-05 MED ORDER — CEPHALEXIN 500 MG PO CAPS: 500.0000 mg | ORAL_CAPSULE | Freq: Three times a day (TID) | ORAL | Status: DC

## 2015-05-05 NOTE — ED Notes (Signed)
[facility] CALLED @ [phone].

## 2015-05-05 NOTE — ED Notes (Signed)
[facility] arrived to transport home 

## 2015-05-05 NOTE — ED Notes (Signed)
Spoke to Dr. Lita Mains in regards to home care, MD advises to prepare home health nurse visit to assess need for assistance.

## 2015-05-05 NOTE — Discharge Instructions (Signed)
Home health will contact you for evaluation on Monday. Hold the Plavix until right lower extremity hematoma is improved. Call and make appointment to follow-up with your primary physician to assure resolution. Take antibiotic as prescribed for the urinary tract infection. Return immediately for increased confusion, fever or for any concerns.   Edema Edema is an abnormal buildup of fluids in your bodytissues. Edema is somewhatdependent on gravity to pull the fluid to the lowest place in your body. That makes the condition more common in the legs and thighs (lower extremities). Painless swelling of the feet and ankles is common and becomes more likely as you get older. It is also common in looser tissues, like around your eyes.  When the affected area is squeezed, the fluid may move out of that spot and leave a dent for a few moments. This dent is called pitting.  CAUSES  There are many possible causes of edema. Eating too much salt and being on your feet or sitting for a long time can cause edema in your legs and ankles. Hot weather may make edema worse. Common medical causes of edema include:  Heart failure.  Liver disease.  Kidney disease.  Weak blood vessels in your legs.  Cancer.  An injury.  Pregnancy.  Some medications.  Obesity. SYMPTOMS  Edema is usually painless.Your skin may look swollen or shiny.  DIAGNOSIS  Your health care provider may be able to diagnose edema by asking about your medical history and doing a physical exam. You may need to have tests such as X-rays, an electrocardiogram, or blood tests to check for medical conditions that may cause edema.  TREATMENT  Edema treatment depends on the cause. If you have heart, liver, or kidney disease, you need the treatment appropriate for these conditions. General treatment may include:  Elevation of the affected body part above the level of your heart.  Compression of the affected body part. Pressure from elastic  bandages or support stockings squeezes the tissues and forces fluid back into the blood vessels. This keeps fluid from entering the tissues.  Restriction of fluid and salt intake.  Use of a water pill (diuretic). These medications are appropriate only for some types of edema. They pull fluid out of your body and make you urinate more often. This gets rid of fluid and reduces swelling, but diuretics can have side effects. Only use diuretics as directed by your health care provider. HOME CARE INSTRUCTIONS   Keep the affected body part above the level of your heart when you are lying down.   Do not sit still or stand for prolonged periods.   Do not put anything directly under your knees when lying down.  Do not wear constricting clothing or garters on your upper legs.   Exercise your legs to work the fluid back into your blood vessels. This may help the swelling go down.   Wear elastic bandages or support stockings to reduce ankle swelling as directed by your health care provider.   Eat a low-salt diet to reduce fluid if your health care provider recommends it.   Only take medicines as directed by your health care provider. SEEK MEDICAL CARE IF:   Your edema is not responding to treatment.  You have heart, liver, or kidney disease and notice symptoms of edema.  You have edema in your legs that does not improve after elevating them.   You have sudden and unexplained weight gain. SEEK IMMEDIATE MEDICAL CARE IF:   You develop  shortness of breath or chest pain.   You cannot breathe when you lie down.  You develop pain, redness, or warmth in the swollen areas.   You have heart, liver, or kidney disease and suddenly get edema.  You have a fever and your symptoms suddenly get worse. MAKE SURE YOU:   Understand these instructions.  Will watch your condition.  Will get help right away if you are not doing well or get worse.   This information is not intended to replace  advice given to you by your health care provider. Make sure you discuss any questions you have with your health care provider.   Document Released: 06/16/2005 Document Revised: 07/07/2014 Document Reviewed: 04/08/2013 Elsevier Interactive Patient Education 2016 Elsevier Inc.  Urinary Tract Infection Urinary tract infections (UTIs) can develop anywhere along your urinary tract. Your urinary tract is your body's drainage system for removing wastes and extra water. Your urinary tract includes two kidneys, two ureters, a bladder, and a urethra. Your kidneys are a pair of bean-shaped organs. Each kidney is about the size of your fist. They are located below your ribs, one on each side of your spine. CAUSES Infections are caused by microbes, which are microscopic organisms, including fungi, viruses, and bacteria. These organisms are so small that they can only be seen through a microscope. Bacteria are the microbes that most commonly cause UTIs. SYMPTOMS  Symptoms of UTIs may vary by age and gender of the patient and by the location of the infection. Symptoms in young women typically include a frequent and intense urge to urinate and a painful, burning feeling in the bladder or urethra during urination. Older women and men are more likely to be tired, shaky, and weak and have muscle aches and abdominal pain. A fever may mean the infection is in your kidneys. Other symptoms of a kidney infection include pain in your back or sides below the ribs, nausea, and vomiting. DIAGNOSIS To diagnose a UTI, your caregiver will ask you about your symptoms. Your caregiver will also ask you to provide a urine sample. The urine sample will be tested for bacteria and white blood cells. White blood cells are made by your body to help fight infection. TREATMENT  Typically, UTIs can be treated with medication. Because most UTIs are caused by a bacterial infection, they usually can be treated with the use of antibiotics. The  choice of antibiotic and length of treatment depend on your symptoms and the type of bacteria causing your infection. HOME CARE INSTRUCTIONS  If you were prescribed antibiotics, take them exactly as your caregiver instructs you. Finish the medication even if you feel better after you have only taken some of the medication.  Drink enough water and fluids to keep your urine clear or pale yellow.  Avoid caffeine, tea, and carbonated beverages. They tend to irritate your bladder.  Empty your bladder often. Avoid holding urine for long periods of time.  Empty your bladder before and after sexual intercourse.  After a bowel movement, women should cleanse from front to back. Use each tissue only once. SEEK MEDICAL CARE IF:   You have back pain.  You develop a fever.  Your symptoms do not begin to resolve within 3 days. SEEK IMMEDIATE MEDICAL CARE IF:   You have severe back pain or lower abdominal pain.  You develop chills.  You have nausea or vomiting.  You have continued burning or discomfort with urination. MAKE SURE YOU:   Understand these instructions.  Will watch your condition.  Will get help right away if you are not doing well or get worse.   This information is not intended to replace advice given to you by your health care provider. Make sure you discuss any questions you have with your health care provider.   Document Released: 03/26/2005 Document Revised: 03/07/2015 Document Reviewed: 07/25/2011 Elsevier Interactive Patient Education 2016 Elsevier Inc.  Hyponatremia Hyponatremia is when the amount of salt (sodium) in your blood is too low. When sodium levels are low, your cells absorb extra water and they swell. The swelling happens throughout the body, but it mostly affects the brain. CAUSES This condition may be caused by:  Heart, kidney, or liver problems.  Thyroid problems.  Adrenal gland problems.  Metabolic conditions, such as syndrome of  inappropriate antidiuretic hormone (SIADH).  Severe vomiting and diarrhea.  Certain medicines or illegal drugs.  Dehydration.  Drinking too much water.  Eating a diet that is low in sodium.  Large burns on your body.  Sweating. RISK FACTORS This condition is more likely to develop in people who:  Have long-term (chronic) kidney disease.  Have heart failure.  Have a medical condition that causes frequent or excessive diarrhea.  Have metabolic conditions, such as Addison disease or SIADH.  Take certain medicines that affect the sodium and fluid balance in the blood. Some of these medicine types include:  Diuretics.  NSAIDs.  Some opioid pain medicines.  Some antidepressants.  Some seizure prevention medicines. SYMPTOMS  Symptoms of this condition include:  Nausea and vomiting.  Confusion.  Lethargy.  Agitation.  Headache.  Seizures.  Unconsciousness.  Appetite loss.  Muscle weakness and cramping.  Feeling weak or light-headed.  Having a rapid heart rate.  Fainting, in severe cases. DIAGNOSIS This condition is diagnosed with a medical history and physical exam. You will also have other tests, including:  Blood tests.  Urine tests. TREATMENT Treatment for this condition depends on the cause. Treatment may include:  Fluids given through an IV tube that is inserted into one of your veins.  Medicines to correct the sodium imbalance. If medicines are causing the condition, the medicines will need to be adjusted.  Limiting water or fluid intake to get the correct sodium balance. HOME CARE INSTRUCTIONS  Take medicines only as directed by your health care provider. Many medicines can make this condition worse. Talk with your health care provider about any medicines that you are currently taking.  Carefully follow a recommended diet as directed by your health care provider.  Carefully follow instructions from your health care provider about fluid  restrictions.  Keep all follow-up visits as directed by your health care provider. This is important.  Do not drink alcohol. SEEK MEDICAL CARE IF:  You develop worsening nausea, fatigue, headache, confusion, or weakness.  Your symptoms go away and then return.  You have problems following the recommended diet. SEEK IMMEDIATE MEDICAL CARE IF:  You have a seizure.  You faint.  You have ongoing diarrhea or vomiting.   This information is not intended to replace advice given to you by your health care provider. Make sure you discuss any questions you have with your health care provider.   Document Released: 06/06/2002 Document Revised: 10/31/2014 Document Reviewed: 07/06/2014 Elsevier Interactive Patient Education [employer].

## 2015-05-05 NOTE — ED Notes (Signed)
Discussed urine sample is needed, provided bedside commode, and encouraged to notify staff for transfer assistance.

## 2015-05-05 NOTE — ED Notes (Signed)
Informed MD Lita Mains about change in skin condition on pt right forearm. MD acknowledged and said he would inspect site.

## 2015-05-14 ENCOUNTER — Emergency Department (HOSPITAL_COMMUNITY): Payer: Medicare Other

## 2015-05-14 ENCOUNTER — Emergency Department (HOSPITAL_BASED_OUTPATIENT_CLINIC_OR_DEPARTMENT_OTHER)
Admit: 2015-05-14 | Discharge: 2015-05-14 | Disposition: A | Discharge status: Home / Self Care | Payer: Medicare Other | Attending: Emergency Medicine | Admitting: Emergency Medicine

## 2015-05-14 ENCOUNTER — Inpatient Hospital Stay (HOSPITAL_COMMUNITY)
Admission: EM | Admit: 2015-05-14 | Discharge: 2015-05-21 | DRG: 602 | Disposition: A | Discharge status: Skilled Nursing Facility | Payer: Medicare Other | Attending: Internal Medicine | Admitting: Internal Medicine

## 2015-05-14 ENCOUNTER — Encounter (HOSPITAL_COMMUNITY): Payer: Self-pay

## 2015-05-14 DIAGNOSIS — D509 Iron deficiency anemia, unspecified: Secondary | ICD-10-CM | POA: Diagnosis present

## 2015-05-14 DIAGNOSIS — Z9841 Cataract extraction status, right eye: Secondary | ICD-10-CM

## 2015-05-14 DIAGNOSIS — H9190 Unspecified hearing loss, unspecified ear: Secondary | ICD-10-CM | POA: Diagnosis not present

## 2015-05-14 DIAGNOSIS — M79609 Pain in unspecified limb: Secondary | ICD-10-CM | POA: Diagnosis not present

## 2015-05-14 DIAGNOSIS — Z8673 Personal history of transient ischemic attack (TIA), and cerebral infarction without residual deficits: Secondary | ICD-10-CM | POA: Diagnosis not present

## 2015-05-14 DIAGNOSIS — Z9842 Cataract extraction status, left eye: Secondary | ICD-10-CM

## 2015-05-14 DIAGNOSIS — L03115 Cellulitis of right lower limb: Secondary | ICD-10-CM | POA: Diagnosis present

## 2015-05-14 DIAGNOSIS — Z96641 Presence of right artificial hip joint: Secondary | ICD-10-CM | POA: Diagnosis present

## 2015-05-14 DIAGNOSIS — Z79891 Long term (current) use of opiate analgesic: Secondary | ICD-10-CM | POA: Diagnosis not present

## 2015-05-14 DIAGNOSIS — L03119 Cellulitis of unspecified part of limb: Secondary | ICD-10-CM

## 2015-05-14 DIAGNOSIS — Z79899 Other long term (current) drug therapy: Secondary | ICD-10-CM | POA: Diagnosis not present

## 2015-05-14 DIAGNOSIS — J9621 Acute and chronic respiratory failure with hypoxia: Secondary | ICD-10-CM | POA: Diagnosis present

## 2015-05-14 DIAGNOSIS — E871 Hypo-osmolality and hyponatremia: Secondary | ICD-10-CM | POA: Diagnosis present

## 2015-05-14 DIAGNOSIS — E43 Unspecified severe protein-calorie malnutrition: Secondary | ICD-10-CM | POA: Diagnosis present

## 2015-05-14 DIAGNOSIS — J449 Chronic obstructive pulmonary disease, unspecified: Secondary | ICD-10-CM | POA: Diagnosis not present

## 2015-05-14 DIAGNOSIS — E440 Moderate protein-calorie malnutrition: Secondary | ICD-10-CM | POA: Diagnosis not present

## 2015-05-14 DIAGNOSIS — I10 Essential (primary) hypertension: Secondary | ICD-10-CM | POA: Diagnosis not present

## 2015-05-14 DIAGNOSIS — S81801A Unspecified open wound, right lower leg, initial encounter: Secondary | ICD-10-CM

## 2015-05-14 DIAGNOSIS — L02419 Cutaneous abscess of limb, unspecified: Secondary | ICD-10-CM | POA: Diagnosis not present

## 2015-05-14 DIAGNOSIS — Z8619 Personal history of other infectious and parasitic diseases: Secondary | ICD-10-CM

## 2015-05-14 DIAGNOSIS — Z6821 Body mass index (BMI) 21.0-21.9, adult: Secondary | ICD-10-CM | POA: Diagnosis not present

## 2015-05-14 DIAGNOSIS — Z7401 Bed confinement status: Secondary | ICD-10-CM | POA: Diagnosis not present

## 2015-05-14 DIAGNOSIS — Z66 Do not resuscitate: Secondary | ICD-10-CM | POA: Diagnosis not present

## 2015-05-14 DIAGNOSIS — R609 Edema, unspecified: Secondary | ICD-10-CM

## 2015-05-14 DIAGNOSIS — Z9981 Dependence on supplemental oxygen: Secondary | ICD-10-CM

## 2015-05-14 DIAGNOSIS — S81809A Unspecified open wound, unspecified lower leg, initial encounter: Secondary | ICD-10-CM | POA: Insufficient documentation

## 2015-05-14 DIAGNOSIS — R059 Cough, unspecified: Secondary | ICD-10-CM

## 2015-05-14 DIAGNOSIS — M79604 Pain in right leg: Secondary | ICD-10-CM | POA: Diagnosis present

## 2015-05-14 DIAGNOSIS — Z7902 Long term (current) use of antithrombotics/antiplatelets: Secondary | ICD-10-CM | POA: Diagnosis not present

## 2015-05-14 DIAGNOSIS — Z23 Encounter for immunization: Secondary | ICD-10-CM

## 2015-05-14 DIAGNOSIS — R627 Adult failure to thrive: Secondary | ICD-10-CM | POA: Diagnosis present

## 2015-05-14 DIAGNOSIS — Z515 Encounter for palliative care: Secondary | ICD-10-CM

## 2015-05-14 DIAGNOSIS — E8770 Fluid overload, unspecified: Secondary | ICD-10-CM | POA: Diagnosis present

## 2015-05-14 DIAGNOSIS — D638 Anemia in other chronic diseases classified elsewhere: Secondary | ICD-10-CM | POA: Diagnosis present

## 2015-05-14 DIAGNOSIS — R32 Unspecified urinary incontinence: Secondary | ICD-10-CM | POA: Diagnosis present

## 2015-05-14 DIAGNOSIS — G8929 Other chronic pain: Secondary | ICD-10-CM | POA: Diagnosis present

## 2015-05-14 DIAGNOSIS — J9611 Chronic respiratory failure with hypoxia: Secondary | ICD-10-CM | POA: Diagnosis not present

## 2015-05-14 DIAGNOSIS — X58XXXA Exposure to other specified factors, initial encounter: Secondary | ICD-10-CM | POA: Diagnosis present

## 2015-05-14 DIAGNOSIS — Z888 Allergy status to other drugs, medicaments and biological substances status: Secondary | ICD-10-CM | POA: Diagnosis not present

## 2015-05-14 DIAGNOSIS — M545 Low back pain: Secondary | ICD-10-CM | POA: Diagnosis present

## 2015-05-14 DIAGNOSIS — F0390 Unspecified dementia without behavioral disturbance: Secondary | ICD-10-CM | POA: Diagnosis not present

## 2015-05-14 DIAGNOSIS — J438 Other emphysema: Secondary | ICD-10-CM

## 2015-05-14 DIAGNOSIS — Z8701 Personal history of pneumonia (recurrent): Secondary | ICD-10-CM

## 2015-05-14 DIAGNOSIS — Z961 Presence of intraocular lens: Secondary | ICD-10-CM | POA: Diagnosis not present

## 2015-05-14 DIAGNOSIS — Z87440 Personal history of urinary (tract) infections: Secondary | ICD-10-CM

## 2015-05-14 DIAGNOSIS — L0390 Cellulitis, unspecified: Secondary | ICD-10-CM | POA: Diagnosis present

## 2015-05-14 DIAGNOSIS — Z885 Allergy status to narcotic agent status: Secondary | ICD-10-CM | POA: Diagnosis not present

## 2015-05-14 DIAGNOSIS — R05 Cough: Secondary | ICD-10-CM

## 2015-05-14 LAB — BASIC METABOLIC PANEL
ANION GAP: 9 (ref 5–15)
BUN: 12 mg/dL (ref 6–20)
CO2: 28 mmol/L (ref 22–32)
Calcium: 8.6 mg/dL — ABNORMAL LOW (ref 8.9–10.3)
Chloride: 89 mmol/L — ABNORMAL LOW (ref 101–111)
Creatinine, Ser: 0.51 mg/dL (ref 0.44–1.00)
GFR calc non Af Amer: >60 mL/min (ref >60)
Glucose, Bld: 88 mg/dL (ref 65–99)
Potassium: 3.9 mmol/L (ref 3.5–5.1)
Sodium: 126 mmol/L — ABNORMAL LOW (ref 135–145)

## 2015-05-14 LAB — CBC WITH DIFFERENTIAL/PLATELET
BASOS PCT: 0 %
Basophils Absolute: 0 10*3/uL (ref 0.0–0.1)
EOS PCT: 1 %
Eosinophils Absolute: 0.1 10*3/uL (ref 0.0–0.7)
HCT: 27.2 % — ABNORMAL LOW (ref 36.0–46.0)
Hemoglobin: 8.7 g/dL — ABNORMAL LOW (ref 12.0–15.0)
Lymphocytes Relative: 27 %
Lymphs Abs: 1.1 10*3/uL (ref 0.7–4.0)
MCH: 23.2 pg — ABNORMAL LOW (ref 26.0–34.0)
MCHC: 32 g/dL (ref 30.0–36.0)
MCV: 72.5 fL — AB (ref 78.0–100.0)
MONO ABS: 0.8 10*3/uL (ref 0.1–1.0)
Monocytes Relative: 19 %
Neutro Abs: 2.1 10*3/uL (ref 1.7–7.7)
Neutrophils Relative %: 53 %
PLATELETS: 280 10*3/uL (ref 150–400)
RBC: 3.75 MIL/uL — ABNORMAL LOW (ref 3.87–5.11)
RDW: 15.4 % (ref 11.5–15.5)
WBC: 4 10*3/uL (ref 4.0–10.5)

## 2015-05-14 LAB — BRAIN NATRIURETIC PEPTIDE: B Natriuretic Peptide: 198.8 pg/mL — ABNORMAL HIGH (ref 0.0–100.0)

## 2015-05-14 LAB — I-STAT TROPONIN, ED: Troponin i, poc: 0.01 ng/mL (ref 0.00–0.08)

## 2015-05-14 MED ORDER — SODIUM CHLORIDE 0.9 % IJ SOLN
3.0000 mL | Freq: Two times a day (BID) | INTRAMUSCULAR | Status: DC
  Administered 2015-05-14 – 2015-05-21 (×11): 3 mL via INTRAVENOUS

## 2015-05-14 MED ORDER — VANCOMYCIN HCL IN DEXTROSE 750-5 MG/150ML-% IV SOLN
750.0000 mg | INTRAVENOUS | Status: DC
  Administered 2015-05-15 – 2015-05-17 (×3): 750 mg via INTRAVENOUS
  Filled 2015-05-14 (×4): qty 150

## 2015-05-14 MED ORDER — HYDROCODONE-ACETAMINOPHEN 5-325 MG PO TABS
0.5000 | ORAL_TABLET | Freq: Two times a day (BID) | ORAL | Status: DC | PRN
  Administered 2015-05-15 – 2015-05-20 (×4): 0.5 via ORAL
  Filled 2015-05-14 (×4): qty 1

## 2015-05-14 MED ORDER — PIPERACILLIN-TAZOBACTAM 3.375 G IVPB
3.3750 g | Freq: Three times a day (TID) | INTRAVENOUS | Status: DC
  Administered 2015-05-15 – 2015-05-18 (×11): 3.375 g via INTRAVENOUS
  Filled 2015-05-14 (×12): qty 50

## 2015-05-14 MED ORDER — VANCOMYCIN HCL IN DEXTROSE 1-5 GM/200ML-% IV SOLN: 1000.0000 mg | Freq: Once | INTRAVENOUS | Status: DC

## 2015-05-14 MED ORDER — VANCOMYCIN HCL 500 MG IV SOLR: 500.0000 mg | INTRAVENOUS | Status: DC

## 2015-05-14 MED ORDER — GABAPENTIN 100 MG PO CAPS
100.0000 mg | ORAL_CAPSULE | Freq: Every day | ORAL | Status: DC
Start: 2015-05-14 — End: 2015-05-21
  Administered 2015-05-14 – 2015-05-20 (×7): 100 mg via ORAL
  Filled 2015-05-14 (×8): qty 1

## 2015-05-14 MED ORDER — IPRATROPIUM-ALBUTEROL 0.5-2.5 (3) MG/3ML IN SOLN
3.0000 mL | Freq: Two times a day (BID) | RESPIRATORY_TRACT | Status: DC
  Administered 2015-05-14 – 2015-05-21 (×14): 3 mL via RESPIRATORY_TRACT
  Filled 2015-05-14 (×14): qty 3

## 2015-05-14 MED ORDER — VANCOMYCIN HCL IN DEXTROSE 1-5 GM/200ML-% IV SOLN
1000.0000 mg | Freq: Once | INTRAVENOUS | Status: AC
  Administered 2015-05-14: 1000 mg via INTRAVENOUS
  Filled 2015-05-14: qty 200

## 2015-05-14 MED ORDER — FUROSEMIDE 20 MG PO TABS: 10.0000 mg | ORAL_TABLET | Freq: Every day | ORAL | Status: DC

## 2015-05-14 MED ORDER — PIPERACILLIN-TAZOBACTAM 3.375 G IVPB 30 MIN
3.3750 g | Freq: Once | INTRAVENOUS | Status: AC
  Administered 2015-05-14: 3.375 g via INTRAVENOUS
  Filled 2015-05-14: qty 50

## 2015-05-14 MED ORDER — TRAMADOL HCL 50 MG PO TABS
25.0000 mg | ORAL_TABLET | Freq: Every day | ORAL | Status: DC
  Administered 2015-05-14 – 2015-05-20 (×7): 25 mg via ORAL
  Filled 2015-05-14 (×7): qty 1

## 2015-05-14 MED ORDER — ONDANSETRON HCL 4 MG/2ML IJ SOLN: 4.0000 mg | Freq: Four times a day (QID) | INTRAMUSCULAR | Status: DC | PRN

## 2015-05-14 MED ORDER — ENOXAPARIN SODIUM 30 MG/0.3ML ~~LOC~~ SOLN
30.0000 mg | SUBCUTANEOUS | Status: DC
  Administered 2015-05-14: 30 mg via SUBCUTANEOUS
  Filled 2015-05-14 (×2): qty 0.3

## 2015-05-14 MED ORDER — SODIUM CHLORIDE 0.9 % IV SOLN
INTRAVENOUS | Status: DC
  Administered 2015-05-14 (×2): via INTRAVENOUS

## 2015-05-14 MED ORDER — ONDANSETRON HCL 4 MG PO TABS
4.0000 mg | ORAL_TABLET | Freq: Four times a day (QID) | ORAL | Status: DC | PRN
Start: 2015-05-14 — End: 2015-05-21

## 2015-05-14 MED ORDER — METOPROLOL TARTRATE 50 MG PO TABS
50.0000 mg | ORAL_TABLET | Freq: Two times a day (BID) | ORAL | Status: DC
  Administered 2015-05-14 – 2015-05-21 (×14): 50 mg via ORAL
  Filled 2015-05-14 (×15): qty 1

## 2015-05-14 MED ORDER — CLOPIDOGREL BISULFATE 75 MG PO TABS
75.0000 mg | ORAL_TABLET | Freq: Every day | ORAL | Status: DC
  Administered 2015-05-15 – 2015-05-19 (×5): 75 mg via ORAL
  Filled 2015-05-14 (×8): qty 1

## 2015-05-14 NOTE — Progress Notes (Signed)
Utilization Review completed.  Elayjah Chaney RN CM  

## 2015-05-14 NOTE — Progress Notes (Signed)
VASCULAR LAB PRELIMINARY  PRELIMINARY  PRELIMINARY  PRELIMINARY  Right lower extremity venous duplex completed.    Preliminary report:  Right:  No evidence of DVT, superficial thrombosis, or Baker's cyst.  Elven Laboy, RVS 05/14/2015, 4:14 PM

## 2015-05-14 NOTE — ED Notes (Signed)
Patient transported to X-ray 

## 2015-05-14 NOTE — ED Notes (Signed)
Vascular tech at bedside. °

## 2015-05-14 NOTE — ED Provider Notes (Signed)
Pt seen and evaluated.  H/O leg ulcer for months.  Increasing pain, swelling, hematoma, bullae, and pain.  Today with increased errythema despite keflex.  Recommend XR r/o osteo.  [place] r/o DVT. BLC, IV antibiotics, and admit.  Tanna Furry, MD 05/14/15 [phone]

## 2015-05-14 NOTE — Progress Notes (Signed)
Discussed admission status with Dr. Doyle Askew.  Orders placed.

## 2015-05-14 NOTE — ED Notes (Addendum)
Pt presents with c/o right leg pain. Pt recently had shingles as well but does not currently have them. Pt was recently seen at [facility] and treated for hyponatremia and a UTI. Pt was also treated for a hematoma on that right lower leg and now she has a sac of blisters on her right lower leg, oozing and pus-filled per EMS. EMS reports her leg is so swollen that the right foot is white in color. Pt is also on a NRB per EMS. Initial sats were low (70's?) and have been 85-91% on 10L with a NRB. Pt is not c/o any shortness of breath at this time.

## 2015-05-14 NOTE — H&P (Addendum)
[facility] History and Physical  Holly Allen [id number] DOB: 1914/12/10 DOA: 05/14/2015  Referring physician: ED physician, Dr. Jeneen Rinks  PCP: Woody Seller, MD   Chief Complaint: poor wound healing in the right lower extremity   HPI:  81 y.o. female with PMH significant for dementia, HTN, TIA, and COPD on oxygen at home, who was recently seen 05/05/15 for right lower leg pain and swelling secondary to hematoma and presented back to [facility] ED with progressively worsening pain since dsicharge. Pain is constant and throbbing, 7/10 in severity, located on anterior shin of the RLE, associated with blistering and surrounding redness, swelling, pain to touch, no specific alleviating factors. Pt's family reports pt is mostly bed bound and needs assistance with all ADL's. Denies chest pain, shortness of breath, N/V, abdominal pain, or fevers. Upon arrival, patient was on NRB due to low oxygen saturation, 85-91% on 10L. She normally requires 3L [place] at home. SBP in 150's, blood work notable for Na 126. TRH asked to admit for further evaluation.   Assessment and Plan: Active Problems:   Right lower extremity wound with cellulitis - place on broad spectrum ABX vanc and Zosyn - cellulitis order set in place - follow up on blood cultures - wound care consult requested     Chronic hypoxic respiratory failure - COPD, keep on oxygen via [place] 3-4 L    Hypnatermia - provide IVF and repeat MBP In AM - hold Lasix     Anemia of chronic disease - no signs of bleeding - CBC in AM    Essential HTN - continue home medical regimen   Lovenox SQ for DVT prophylaxis   Radiological Exams on Admission: Dg Chest 2 View  05/14/2015  CLINICAL DATA:  Shortness of breath. Nonhealing wound on the lower leg. EXAM: CHEST  2 VIEW COMPARISON:  Chest x-ray dated 05/04/2015 and 03/10/2014 and 04/10/2010 FINDINGS: There is marked tortuosity and calcification of the thoracic aorta and there is chronic  prominence of the main pulmonary arteries consistent with pulmonary arterial hypertension. COPD. Slight atelectasis at both lung bases with a small new left pleural effusion. Diffuse osteopenia with multiple old compression fractures in the spine. IMPRESSION: Slight bibasilar atelectasis with a new small left effusion. Aortic atherosclerosis. Electronically Signed   By: Lorriane Shire M.D.   On: 05/14/2015 14:19   Dg Tibia/fibula Right  05/14/2015  CLINICAL DATA:  Nonhealing wound mid anterior right tib-fib. EXAM: RIGHT TIBIA AND FIBULA - 2 VIEW COMPARISON:  None. FINDINGS: Significant soft tissue edema. Vascular calcifications are present. No acute fracture or subluxation. No radiopaque foreign body or soft tissue gas. IMPRESSION: Significant soft tissue swelling. Electronically Signed   By: Nolon Nations M.D.   On: 05/14/2015 14:34     Code Status: Full Family Communication: Pt and family at bedside Disposition Plan: Admit for further evaluation    Holly Allen [facility] [id number]   Review of Systems: Obtained from family as pt with dementia  Constitutional: Negative for diaphoresis.  HENT: Negative for hearing loss, ear pain, nosebleeds, congestion, sore throat, neck pain, tinnitus and ear discharge.   Eyes: Negative for blurred vision, double vision, photophobia, pain, discharge and redness.  Respiratory: Negative for shortness of breath, wheezing and stridor.   Cardiovascular: Negative for chest pain, palpitations, orthopnea, claudication and leg swelling.  Gastrointestinal: Negative for nausea, vomiting and abdominal pain.  Genitourinary: Negative for dysuria, urgency, frequency, hematuria and flank pain.  Musculoskeletal: Negative for myalgias, back pain.  Skin: Negative for itching  and rash.  Neurological: Negative for dizziness and weakness.   Endo/Heme/Allergies: Negative for environmental allergies and polydipsia. Does not bruise/bleed easily.  Psychiatric/Behavioral: Negative for  suicidal ideas. The patient is not nervous/anxious.      Past Medical History  Diagnosis Date  . Hypertension   . TIA (transient ischemic attack)     history without residual deficits  . Leukocytosis   . Iron deficiency anemia   . COPD (chronic obstructive pulmonary disease) ([place])   . Pneumonia     "couple times" (12/05/2013)  . History of blood transfusion 2013    "low blood pressure after hip OR"  . Pernicious anemia     hx; "did B12 shots q month for years; must have outgrew it"  . Scarlet fever     "as a child"  . Stroke [facility]) X 2-3    "didn't leave her w/any permanent problems that we know of" (12/05/2013)  . Chronic lower back pain   . UTI (lower urinary tract infection) 01/2013; 12/05/2013    "severe";   . 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 Hard of hearing     Past Surgical History  Procedure Laterality Date  . Hip arthroplasty  03/17/2012    Procedure: ARTHROPLASTY BIPOLAR HIP;  Surgeon: Mcarthur Rossetti, MD;  Location: [place];  Service: Orthopedics;  Laterality: Right;  . Ankle fracture surgery    . Appendectomy    . Cataract extraction w/ intraocular lens  implant, bilateral Bilateral   . Kyphoplasty  X 3  . Back surgery    . Lumbar disc surgery  X 2  . Abdominal hysterectomy  07/1971    Social History:  reports that she has never smoked. She has never used smokeless tobacco. She reports that she does not drink alcohol or use illicit drugs.  Allergies  Allergen Reactions  . Codeine     vomits  . Other Other (See Comments)    Muscle relaxant may have caused her stroke per family   No family history of HTN or DM  Prior to Admission medications   Medication Sig Start Date End Date Taking? Authorizing Provider  cholecalciferol (VITAMIN D) 1000 UNITS tablet Take 1,000 Units by mouth daily.   Yes Historical Provider, MD  furosemide (LASIX) 20 MG tablet Take 0.5 tablets (10 mg total) by mouth daily. 05/05/15  Yes Julianne Rice, MD  gabapentin (NEURONTIN) 100 MG capsule Take 100 mg by mouth at bedtime.   Yes Historical Provider, MD  HYDROcodone-acetaminophen (NORCO/VICODIN) 5-325 MG per tablet Take 0.5 tablets by mouth 2 (two) times daily as needed (pain).   Yes Historical Provider, MD  ipratropium-albuterol (DUONEB) 0.5-2.5 (3) MG/3ML SOLN Take 3 mLs by nebulization 2 (two) times daily.   Yes Historical Provider, MD  metoprolol (LOPRESSOR) 50 MG tablet Take 50 mg by mouth 2 (two) times daily.   Yes Historical Provider, MD  traMADol (ULTRAM) 50 MG tablet Take 25 mg by mouth at bedtime.   Yes Historical Provider, MD  cephALEXin (KEFLEX) 500 MG capsule Take 1 capsule (500 mg total) by mouth 3 (three) times daily. Patient not taking: Reported on 05/14/2015 05/05/15   Julianne Rice, MD  clopidogrel (PLAVIX) 75 MG tablet Take 75 mg by mouth daily with breakfast.    Historical Provider, MD  HYDROcodone-acetaminophen (NORCO/VICODIN) 5-325 MG per tablet Take 1 tablet by mouth every 4 (four) hours as needed. Patient not taking:  Reported on 05/04/2015 03/10/14   Dorie Rank, MD  levofloxacin (LEVAQUIN) 500 MG tablet Take 1 tablet (500 mg total) by mouth every other day. Patient not taking: Reported on 05/04/2015 12/10/13   Donne Hazel, MD  predniSONE (DELTASONE) 20 MG tablet Take 2 tablets (40 mg total) by mouth daily with breakfast. Patient not taking: Reported on 05/04/2015 12/09/13   Donne Hazel, MD    Physical Exam: Filed Vitals:   05/14/15 1257 05/14/15 1306 05/14/15 1426 05/14/15 1535  BP: 158/75   158/68  Pulse: 67   70  Temp:  96 F (35.6 C)    TempSrc:  Rectal    Resp: 14   22  Height:   5\' 4"  (1.626 m)   Weight:   48.988 kg (108 lb)   SpO2: 100%   100%    Physical Exam  Constitutional: Appears calm, NAD HENT: Normocephalic. External right and left ear normal. Dry MM Eyes: Conjunctivae and EOM are normal. PERRLA, no scleral icterus.  Neck: Normal ROM. Neck supple. No JVD. No tracheal deviation. No  thyromegaly.  CVS: RRR, S1/S2 +,  no gallops, no carotid bruit.  Pulmonary: Effort and breath sounds normal, no stridor,diminished breath sounds at bases  Abdominal: Soft. BS +,  no distension, tenderness, rebound or guarding.  Musculoskeletal: 2+ pitting edema bilaterally in lower extremities. RLE anterior shin with large wound, not open but with blistering area, non purulent drainage  Lymphadenopathy: No lymphadenopathy noted, cervical, inguinal. Neuro: Alert. No cranial nerve deficit. Skin: Skin is warm and dry. No rash noted. Not diaphoretic. No erythema. No pallor.  Psychiatric: Normal mood  Labs on Admission:  Basic Metabolic Panel:  Recent Labs Lab 05/14/15 1349  NA 126*  K 3.9  CL 89*  CO2 28  GLUCOSE 88  BUN 12  CREATININE 0.51  CALCIUM 8.6*   CBC:  Recent Labs Lab 05/14/15 1349  WBC 4.0  NEUTROABS 2.1  HGB 8.7*  HCT 27.2*  MCV 72.5*  PLT 280   EKG: pending   If 7PM-7AM, please contact night-coverage [email or web address] Password [email or web address] 05/14/2015, 4:04 PM

## 2015-05-14 NOTE — Progress Notes (Addendum)
ANTIBIOTIC CONSULT NOTE - INITIAL  Pharmacy Consult for Vancomycin, Zosyn Indication: Wound Infection  Allergies  Allergen Reactions  . Codeine     vomits  . Other Other (See Comments)    Muscle relaxant may have caused her stroke per family    Patient Measurements: Height: 5\' 4"  (162.6 cm) Weight: 114 lb 6.7 oz (51.9 kg) IBW/kg (Calculated) : 54.7  Vital Signs: Temp: 97.6 F (36.4 C) (11/14 1720) Temp Source: Oral (11/14 1720) BP: 151/62 mmHg (11/14 1720) Pulse Rate: 72 (11/14 1720) Intake/Output from previous day:   Intake/Output from this shift:    Labs:  Recent Labs  05/14/15 1349  WBC 4.0  HGB 8.7*  PLT 280  CREATININE 0.51   Estimated Creatinine Clearance: 32.2 mL/min (by C-G formula based on Cr of 0.51). No results for input(s): VANCOTROUGH, VANCOPEAK, VANCORANDOM, GENTTROUGH, GENTPEAK, GENTRANDOM, TOBRATROUGH, TOBRAPEAK, TOBRARND, AMIKACINPEAK, AMIKACINTROU, AMIKACIN in the last 72 hours.   Microbiology: No results found for this or any previous visit (from the past 720 hour(s)).  Medical History: Past Medical History  Diagnosis Date  . Hypertension   . TIA (transient ischemic attack)     history without residual deficits  . Leukocytosis   . Iron deficiency anemia   . COPD (chronic obstructive pulmonary disease) ([place])   . Pneumonia     "couple times" (12/05/2013)  . History of blood transfusion 2013    "low blood pressure after hip OR"  . Pernicious anemia     hx; "did B12 shots q month for years; must have outgrew it"  . Scarlet fever     "as a child"  . Stroke [facility]) X 2-3    "didn't leave her w/any permanent problems that we know of" (12/05/2013)  . Chronic lower back pain   . UTI (lower urinary tract infection) 01/2013; 12/05/2013    "severe";   . 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 Hard of hearing     Assessment: 77 y/oF presents to ED with CC right leg pain with worsening swelling, erythema, and  weeping wound. Patient recently treated for shingles, UTI and hematoma on right lower leg. Starting vancomycin per pharmacy for treatment of wound infection.  11/14 >> Vancomycin >>  11/14 blood x 2: sent  Goal of Therapy:  Vancomycin trough level 15-20 mcg/ml  Appropriate antibiotic dosing for renal function and indication Eradication of infection  Plan:   Vancomycin 1g IV x 1 ordered in ED. Continue with maintenance dose of 750mg  IV q24h.   Plan for Vancomycin trough level at steady state.  Monitor renal function, cultures, clinical course.   Lindell Spar, PharmD, BCPS Pager: [phone] 05/14/2015 3:05 PM   Addendum:  Lajean Silvius added to Vancomycin therapy per admitting MD for right lower extremity wound with cellulitis. Pharmacy asked to assist with dosing.  Plan:  Zosyn 3.375g IV x 1 over 30 minutes, then 3.375g IV q8h (infuse over 4 hours).   Lindell Spar, PharmD, BCPS Pager: [phone] 05/14/2015 7:35 PM

## 2015-05-14 NOTE — ED Notes (Signed)
Portable Xray @ bedside

## 2015-05-14 NOTE — ED Notes (Signed)
Bed: WA03 Expected date:  Expected time:  Means of arrival:  Comments: EMS- blood blisters, low O2 sats

## 2015-05-14 NOTE — ED Notes (Signed)
Per family, pt normally wears oxygen via a [place] at 3L. Pt taken off of NRB at this time as her sats are 100%. Placed on [place] at 4L.

## 2015-05-14 NOTE — ED Provider Notes (Signed)
CSN: [id number]     Arrival date & time 05/14/15  1226 History   First MD Initiated Contact with Patient 05/14/15 1315     Chief Complaint  Patient presents with  . Leg Pain     (Consider location/radiation/quality/duration/timing/severity/associated sxs/prior Treatment) HPI  History provided by family members. Holly Allen is a 81 y.o. female with PMH significant for dementia, HTN, TIA, and COPD who was recently seen 05/05/15 for right lower leg pain and swelling secondary to hematoma who presents with worsening right lower leg pain since discharge. Her son states the wound is now weeping, worsening swelling, and erythema.  HH has been out to apply dressings to the wound and states that it has been getting worse.  Patient does not ambulate at her baseline.  Denies chest pain, shortness of breath, N/V, abdominal pain, or fevers.  Upon arrival, patient was on NRB due to low oxygen saturation, 85-91% on 10L.  She normally requires 3L [place] at home.  She has been moved to 4L [place] and her oxygen saturation has been 100%.   Past Medical History  Diagnosis Date  . Hypertension   . TIA (transient ischemic attack)     history without residual deficits  . Leukocytosis   . Iron deficiency anemia   . COPD (chronic obstructive pulmonary disease) ([place])   . Pneumonia     "couple times" (12/05/2013)  . History of blood transfusion 2013    "low blood pressure after hip OR"  . Pernicious anemia     hx; "did B12 shots q month for years; must have outgrew it"  . Scarlet fever     "as a child"  . Stroke [facility]) X 2-3    "didn't leave her w/any permanent problems that we know of" (12/05/2013)  . Chronic lower back pain   . UTI (lower urinary tract infection) 01/2013; 12/05/2013    "severe";   . 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 Hard of hearing    Past Surgical History  Procedure Laterality Date  . Hip arthroplasty  03/17/2012    Procedure: ARTHROPLASTY BIPOLAR  HIP;  Surgeon: Mcarthur Rossetti, MD;  Location: [place];  Service: Orthopedics;  Laterality: Right;  . Ankle fracture surgery    . Appendectomy    . Cataract extraction w/ intraocular lens  implant, bilateral Bilateral   . Kyphoplasty  X 3  . Back surgery    . Lumbar disc surgery  X 2  . Abdominal hysterectomy  07/1971   No family history on file. Social History  Substance Use Topics  . Smoking status: Never Smoker   . Smokeless tobacco: Never Used  . Alcohol Use: No   OB History    No data available     Review of Systems  Unable to perform ROS: Dementia      Allergies  Codeine and Other  Home Medications   Prior to Admission medications   Medication Sig Start Date End Date Taking? Authorizing Provider  cholecalciferol (VITAMIN D) 1000 UNITS tablet Take 1,000 Units by mouth daily.   Yes Historical Provider, MD  furosemide (LASIX) 20 MG tablet Take 0.5 tablets (10 mg total) by mouth daily. 05/05/15  Yes Julianne Rice, MD  gabapentin (NEURONTIN) 100 MG capsule Take 100 mg by mouth at bedtime.   Yes Historical Provider, MD  HYDROcodone-acetaminophen (NORCO/VICODIN) 5-325 MG per tablet Take 0.5 tablets by mouth 2 (two)  times daily as needed (pain).   Yes Historical Provider, MD  ipratropium-albuterol (DUONEB) 0.5-2.5 (3) MG/3ML SOLN Take 3 mLs by nebulization 2 (two) times daily.   Yes Historical Provider, MD  metoprolol (LOPRESSOR) 50 MG tablet Take 50 mg by mouth 2 (two) times daily.   Yes Historical Provider, MD  traMADol (ULTRAM) 50 MG tablet Take 25 mg by mouth at bedtime.   Yes Historical Provider, MD  cephALEXin (KEFLEX) 500 MG capsule Take 1 capsule (500 mg total) by mouth 3 (three) times daily. Patient not taking: Reported on 05/14/2015 05/05/15   Julianne Rice, MD  clopidogrel (PLAVIX) 75 MG tablet Take 75 mg by mouth daily with breakfast.    Historical Provider, MD  HYDROcodone-acetaminophen (NORCO/VICODIN) 5-325 MG per tablet Take 1 tablet by mouth every 4  (four) hours as needed. Patient not taking: Reported on 05/04/2015 03/10/14   Dorie Rank, MD  levofloxacin (LEVAQUIN) 500 MG tablet Take 1 tablet (500 mg total) by mouth every other day. Patient not taking: Reported on 05/04/2015 12/10/13   Donne Hazel, MD  predniSONE (DELTASONE) 20 MG tablet Take 2 tablets (40 mg total) by mouth daily with breakfast. Patient not taking: Reported on 05/04/2015 12/09/13   Donne Hazel, MD   BP 158/68 mmHg  Pulse 70  Temp(Src) 96 F (35.6 C) (Rectal)  Resp 22  Ht 5\' 4"  (1.626 m)  Wt 108 lb (48.988 kg)  BMI 18.53 kg/m2  SpO2 100% Physical Exam  Constitutional: She is oriented to person, place, and time. She appears well-developed and well-nourished.  HENT:  Head: Normocephalic and atraumatic.  Mouth/Throat: Oropharynx is clear and moist.  Eyes: Conjunctivae are normal. Pupils are equal, round, and reactive to light.  Neck: Normal range of motion. Neck supple.  Cardiovascular: Normal rate, regular rhythm and normal heart sounds.   No murmur heard. Unable to palpate PT or DP pulses.  Skin is warm. 2+ pitting edema bilaterally in lower extremities.   Pulmonary/Chest: Effort normal. No accessory muscle usage or stridor. No respiratory distress. She has decreased breath sounds. She has no wheezes. She has no rhonchi. She has rales.  Abdominal: Soft. Bowel sounds are normal. She exhibits no distension. There is no tenderness.  Musculoskeletal: Normal range of motion.  Lymphadenopathy:    She has no cervical adenopathy.  Neurological: She is alert and oriented to person, place, and time.  Sensation intact bilaterally in upper and lower extremities.   Skin: Skin is warm and dry.     Psychiatric: She has a normal mood and affect. Her behavior is normal.    ED Course  Procedures (including critical care time) Labs Review Labs Reviewed  CBC WITH DIFFERENTIAL/PLATELET - Abnormal; Notable for the following:    RBC 3.75 (*)    Hemoglobin 8.7 (*)    HCT  27.2 (*)    MCV 72.5 (*)    MCH 23.2 (*)    All other components within normal limits  BASIC METABOLIC PANEL - Abnormal; Notable for the following:    Sodium 126 (*)    Chloride 89 (*)    Calcium 8.6 (*)    All other components within normal limits  BRAIN NATRIURETIC PEPTIDE - Abnormal; Notable for the following:    B Natriuretic Peptide 198.8 (*)    All other components within normal limits  URINALYSIS, ROUTINE W REFLEX MICROSCOPIC (NOT AT [facility])  Randolm Idol, ED    Imaging Review Dg Chest 2 View  05/14/2015  CLINICAL DATA:  Shortness  of breath. Nonhealing wound on the lower leg. EXAM: CHEST  2 VIEW COMPARISON:  Chest x-ray dated 05/04/2015 and 03/10/2014 and 04/10/2010 FINDINGS: There is marked tortuosity and calcification of the thoracic aorta and there is chronic prominence of the main pulmonary arteries consistent with pulmonary arterial hypertension. COPD. Slight atelectasis at both lung bases with a small new left pleural effusion. Diffuse osteopenia with multiple old compression fractures in the spine. IMPRESSION: Slight bibasilar atelectasis with a new small left effusion. Aortic atherosclerosis. Electronically Signed   By: Lorriane Shire M.D.   On: 05/14/2015 14:19   Dg Tibia/fibula Right  05/14/2015  CLINICAL DATA:  Nonhealing wound mid anterior right tib-fib. EXAM: RIGHT TIBIA AND FIBULA - 2 VIEW COMPARISON:  None. FINDINGS: Significant soft tissue edema. Vascular calcifications are present. No acute fracture or subluxation. No radiopaque foreign body or soft tissue gas. IMPRESSION: Significant soft tissue swelling. Electronically Signed   By: Nolon Nations M.D.   On: 05/14/2015 14:34   I have personally reviewed and evaluated these images and lab results as part of my medical decision-making.   EKG Interpretation None      MDM   Final diagnoses:  Non-healing wound of lower extremity, right, initial encounter  Cellulitis of right lower extremity    Patient  presents with nonhealing anterior wound since 05/05/15.  VSS, NAD.  On arrival, patient was hypoxic, 85-91% on NRB.  She is now on 4L [place] at 100%.  She is afebrile and normotensive.  Heart RRR, lungs CTAB, abdomen soft and benign.  Non-healing wound on anterior shin with erythema and weeping.  Unable to palpate distal pulses.  Skin is warm.  Will obtain labs, lower extremity duplex, lower extremity xray, and CXR.  Will obtain begin vancomycin.   Troponin 0.01.   CBC shows hemoglobin 8.7, decrease from 9.3 10 days ago.  WBC 4.0. BMP unremarkable. BNP 198.8 CXR shows calcification of thoracic aorta, COPD, slight atelectasis at both lung bases and small new left pleural effusion. Plain films of lower extremity show significant soft tissue swelling.  No fracture or subluxation.  No soft tissue gas.  VE duplex negative.  4:10 PM: patient admitted to hospitalist, Dr. Doyle Askew.   Case has been discussed with and seen by Dr. Jeneen Rinks who agrees with the above plan for admission.     Gloriann Loan, PA-C 05/14/15 [place], PA-C 05/14/15 1618  Tanna Furry, MD 05/15/15 [phone]

## 2015-05-14 NOTE — Progress Notes (Signed)
EDCM spoke to patient and her daughters Bethena Roys and Romie Minus at bedside.  Patient lives at home with her son Inocente Salles [phone].  Patient currently has home health services with [facility] for RN, aide and social worker per patient's daughters.  [facility] text Erasmo Downer of [facility] of patient's admission.  Patient wears oxygen at home supplied by [facility].  Patient requires assistance with ADL's.  Patient can feed herself with her right arm, patient has no strength in her left are per patient's daughters.  Patient's daughters reports she has just started with home health on Friday and was seen by a nurse today.  Patient's daughters report it has been taking 2 people instead of one to assist patient to the commode because of increased weakness.  Patient has a bedside commode, walker, transport chair and nebulizer machine at home.  Patient's daughters report they have a Education officer, museum to see if patient would qualify for Palliative care.  Patient's family report they will need a wheelchair on discharge if patient is discharged to home.  No further EDCM needs at this time.

## 2015-05-15 LAB — URINALYSIS, ROUTINE W REFLEX MICROSCOPIC
Bilirubin Urine: NEGATIVE
Glucose, UA: NEGATIVE mg/dL
Ketones, ur: NEGATIVE mg/dL
NITRITE: NEGATIVE
PROTEIN: NEGATIVE mg/dL
SPECIFIC GRAVITY, URINE: 1.015 (ref 1.005–1.030)
Urobilinogen, UA: 2 mg/dL — ABNORMAL HIGH (ref 0.0–1.0)
pH: 7 (ref 5.0–8.0)

## 2015-05-15 LAB — CBC
HEMATOCRIT: 23.1 % — AB (ref 36.0–46.0)
HEMOGLOBIN: 7.4 g/dL — AB (ref 12.0–15.0)
MCH: 23.3 pg — AB (ref 26.0–34.0)
MCHC: 32 g/dL (ref 30.0–36.0)
MCV: 72.6 fL — AB (ref 78.0–100.0)
PLATELETS: 244 10*3/uL (ref 150–400)
RBC: 3.18 MIL/uL — AB (ref 3.87–5.11)
RDW: 15.4 % (ref 11.5–15.5)
WBC: 4.1 10*3/uL (ref 4.0–10.5)

## 2015-05-15 LAB — BASIC METABOLIC PANEL
ANION GAP: 3 — AB (ref 5–15)
BUN: 11 mg/dL (ref 6–20)
CHLORIDE: 94 mmol/L — AB (ref 101–111)
CO2: 30 mmol/L (ref 22–32)
Calcium: 8.2 mg/dL — ABNORMAL LOW (ref 8.9–10.3)
Creatinine, Ser: 0.57 mg/dL (ref 0.44–1.00)
GFR calc non Af Amer: >60 mL/min (ref >60)
GLUCOSE: 92 mg/dL (ref 65–99)
Potassium: 4.1 mmol/L (ref 3.5–5.1)
Sodium: 127 mmol/L — ABNORMAL LOW (ref 135–145)

## 2015-05-15 LAB — URINE MICROSCOPIC-ADD ON

## 2015-05-15 NOTE — Consult Note (Signed)
WOC wound consult note Reason for Consult: RLE wound. Patient not able to provide history but son in the room provides history that she has had some LE swelling and that she about 2 week ago developed 2 small blood filled blisters on the RLE.  The area progressed and the son was concerned about the circulation in the leg.  She was seen in the [facility] ED 05/04/15 for the same and was DC home with [facility] to monitor the status of the leg.  The son reports that the wound has worsened at home which is why she has been admitted. Report using some type of compression stocking at home but not routinely.  Wound type: hematoma RLE Pressure Ulcer POA: No Measurement:16cm x 10cm x 0.1cm  Wound [id number] filled hematoma with some eschar centrally (eschar 3.5cm x 4.0cm) Drainage (amount, consistency, odor) serosanguinous  Periwound: intact but with some erythema  Dressing procedure/placement/frequency: Will add xeroform as a non adherent with antibacterial affects.  However area has worsened per family and now she does have some eschar forming.  I have asked hospitalist to consider surgical consult just to make sure this area does not need to be drained.   Family is very concerned about her discharge disposition and can not provide the level of care she needs at home.  I have explained that a case manager should be able to work with them on there needs at [place].  Discussed POC with patient and bedside nurse.  Re consult if needed, will not follow at this time. Thanks  Antoney Biven [place], [place] [phone])

## 2015-05-15 NOTE — Progress Notes (Signed)
PT Cancellation Note  Patient Details Name: BENICIA BIRMAN MRN: [id number] DOB: Sep 15, 1914   Cancelled Treatment:    Reason Eval/Treat Not Completed: Fatigue/lethargy limiting ability to participate (had pain meds)   Claretha Cooper 05/15/2015, 4:41 PM Tresa Endo PT [phone]

## 2015-05-15 NOTE — Progress Notes (Signed)
[facility]  Patient Status: Active (receiving services up to time of hospitalization)  [facility] is providing the following services: RN, MSW and HHA  If patient discharges after hours, please call [phone].   Lurlean Leyden 05/15/2015, 11:18 AM

## 2015-05-15 NOTE — Clinical Documentation Improvement (Signed)
Internal Medicine  Abnormal Lab/Test Results:  Serum Na+ running 126, 127. Please update your documentation within the medical record to reflect your response to this query. Do not document in BPA drop down box.Thank you!  Possible Clinical Conditions associated with below indicators  Hyponatremia  Hypernatremia  Other Condition  Cannot Clinically Determine  Supporting Information:  Treatment Provided:  Infusion of 0.9% NaCl running at 75 cc/hr  Please exercise your independent, professional judgment when responding. A specific answer is not anticipated or expected.  Thank You,  Zoila Shutter RN, [place] [phone]; Cell: [phone]

## 2015-05-15 NOTE — Progress Notes (Signed)
Patient ID: Holly Allen, female   DOB: 03/04/1915, 81 y.o.   MRN: [id number]  [facility] PROGRESS NOTE  Holly Allen [id number] DOB: [birth year] DOA: 05/14/2015 PCP: Woody Seller, MD   Brief narrative:    81 y.o. female with PMH significant for dementia, HTN, TIA, and COPD on oxygen at home, who was recently seen 05/05/15 for right lower leg pain and swelling secondary to hematoma and presented back to [facility] ED with progressively worsening pain since dsicharge. Pain is constant and throbbing, 7/10 in severity, located on anterior shin of the RLE, associated with blistering and surrounding redness, swelling, pain to touch, no specific alleviating factors. Pt's family reports pt is mostly bed bound and needs assistance with all ADL's. Denies chest pain, shortness of breath, N/V, abdominal pain, or fevers. Upon arrival, patient was on NRB due to low oxygen saturation, 85-91% on 10L. She normally requires 3L [place] at home. SBP in 150's, blood work notable for Na 126. [facility] asked to admit for further evaluation.   Assessment/Plan:    Active Problems:  Right lower extremity wound with cellulitis - placed on broad spectrum ABX vanc and Zosyn - appreciate wound care team assistance, ortho team consulted as well, appreciate input  - follow up on blood cultures - narrow down ABX as clinically indicated    Chronic hypoxic respiratory failure - COPD, keep on oxygen via [place] 3-4 L   Hyponatermia - slowly improving with IVF - continue to hold Lasix    Anemia of chronic disease, IDA - drop in Hg since admission, likely dilutional  - transfuse for Hg < 7 - CBC in AM   Essential HTN - continue home medical regimen   DVT prophylaxis - change Lovenox to SCD   Code Status: Full.  Family Communication:  plan of care discussed with family at bedside  Disposition Plan: Home when stable.   IV access:  Peripheral IV  Procedures and diagnostic studies:    Dg Chest 2  View  05/14/2015  CLINICAL DATA:  Shortness of breath. Nonhealing wound on the lower leg. EXAM: CHEST  2 VIEW COMPARISON:  Chest x-ray dated 05/04/2015 and 03/10/2014 and 04/10/2010 FINDINGS: There is marked tortuosity and calcification of the thoracic aorta and there is chronic prominence of the main pulmonary arteries consistent with pulmonary arterial hypertension. COPD. Slight atelectasis at both lung bases with a small new left pleural effusion. Diffuse osteopenia with multiple old compression fractures in the spine. IMPRESSION: Slight bibasilar atelectasis with a new small left effusion. Aortic atherosclerosis. Electronically Signed   By: Lorriane Shire M.D.   On: 05/14/2015 14:19   Dg Tibia/fibula Right  05/14/2015  CLINICAL DATA:  Nonhealing wound mid anterior right tib-fib. EXAM: RIGHT TIBIA AND FIBULA - 2 VIEW COMPARISON:  None. FINDINGS: Significant soft tissue edema. Vascular calcifications are present. No acute fracture or subluxation. No radiopaque foreign body or soft tissue gas. IMPRESSION: Significant soft tissue swelling. Electronically Signed   By: Nolon Nations M.D.   On: 05/14/2015 14:34   Dg Chest Port 1 View  05/05/2015  CLINICAL DATA:  Dyspnea 1 year.  Hypertension. EXAM: PORTABLE CHEST 1 VIEW COMPARISON:  03/10/2014 FINDINGS: There is a small left pleural effusion. There is generalized interstitial coarsening which appears chronic. No confluent airspace consolidation. No pneumothorax. IMPRESSION: Small left pleural effusion. Electronically Signed   By: Andreas Newport M.D.   On: 05/05/2015 00:00    Medical Consultants:  Ortho Wound care   Other Consultants:  PT  IAnti-Infectives:   Vancomycin 11/12 --> Zosyn 11/13 -->   Faye Ramsay, MD  [facility] Pager [phone]  If 7PM-7AM, please contact night-coverage [email or web address] Password [email or web address] 05/15/2015, 2:10 PM   LOS: 1 day   HPI/Subjective: No events overnight.   Objective: Filed Vitals:   05/14/15 1928  05/14/15 2132 05/15/15 0919 05/15/15 1000  BP:  140/57  127/55  Pulse:  79    Temp:  98.1 F (36.7 C)    TempSrc:  Oral    Resp:      Height:      Weight:  56.3 kg (124 lb 1.9 oz)    SpO2: 99% 100% 91%     Intake/Output Summary (Last 24 hours) at 05/15/15 1410 Last data filed at 05/15/15 0600  Gross per 24 hour  Intake  982.5 ml  Output      0 ml  Net  982.5 ml    Exam:   General:  Pt is alert, follows commands appropriately, not in acute distress  Cardiovascular: Regular rate and rhythm, SEM 3/6, no rubs, no gallops  Respiratory: Clear to auscultation bilaterally, no wheezing, diminished breath sounds at bases   Abdomen: Soft, non tender, non distended, bowel sounds present, no guarding  Extremities: RLE wound with two blood filled blisters, 16 x 10 cm with surro=unding erythema and TTP  Data Reviewed: Basic Metabolic Panel:  Recent Labs Lab 05/14/15 1349 05/15/15 0520  NA 126* 127*  K 3.9 4.1  CL 89* 94*  CO2 28 30  GLUCOSE 88 92  BUN 12 11  CREATININE 0.51 0.57  CALCIUM 8.6* 8.2*   CBC:  Recent Labs Lab 05/14/15 1349 05/15/15 0520  WBC 4.0 4.1  NEUTROABS 2.1  --   HGB 8.7* 7.4*  HCT 27.2* 23.1*  MCV 72.5* 72.6*  PLT 280 244   Recent Results (from the past 240 hour(s))  Culture, blood (routine x 2)     Status: None (Preliminary result)   Collection Time: 05/14/15  7:30 PM  Result Value Ref Range Status   Specimen Description BLOOD LEFT ARM  Final   Special Requests BOTTLES DRAWN AEROBIC AND ANAEROBIC  10CC  Final   Culture   Final    NO GROWTH < 24 HOURS Performed at [facility]    Report Status PENDING  Incomplete  Culture, blood (routine x 2)     Status: None (Preliminary result)   Collection Time: 05/14/15  7:37 PM  Result Value Ref Range Status   Specimen Description BLOOD RIGHT HAND  Final   Special Requests IN PEDIATRIC BOTTLE  2CC  Final   Culture   Final    NO GROWTH < 24 HOURS Performed at [facility]     Report Status PENDING  Incomplete     Scheduled Meds: . clopidogrel  75 mg Oral Q breakfast  . enoxaparin (LOVENOX) injection  30 mg Subcutaneous Q24H  . gabapentin  100 mg Oral QHS  . ipratropium-albuterol  3 mL Nebulization BID  . metoprolol  50 mg Oral BID  . piperacillin-tazobactam (ZOSYN)  IV  3.375 g Intravenous 3 times per day  . sodium chloride  3 mL Intravenous Q12H  . traMADol  25 mg Oral QHS  . vancomycin  750 mg Intravenous Q24H   Continuous Infusions: . sodium chloride 75 mL/hr at 05/14/15 1900

## 2015-05-16 DIAGNOSIS — L03115 Cellulitis of right lower limb: Principal | ICD-10-CM

## 2015-05-16 DIAGNOSIS — Z515 Encounter for palliative care: Secondary | ICD-10-CM

## 2015-05-16 DIAGNOSIS — E871 Hypo-osmolality and hyponatremia: Secondary | ICD-10-CM

## 2015-05-16 DIAGNOSIS — R627 Adult failure to thrive: Secondary | ICD-10-CM

## 2015-05-16 DIAGNOSIS — Z66 Do not resuscitate: Secondary | ICD-10-CM

## 2015-05-16 DIAGNOSIS — F0390 Unspecified dementia without behavioral disturbance: Secondary | ICD-10-CM

## 2015-05-16 DIAGNOSIS — E440 Moderate protein-calorie malnutrition: Secondary | ICD-10-CM | POA: Insufficient documentation

## 2015-05-16 LAB — CBC
HEMATOCRIT: 23 % — AB (ref 36.0–46.0)
HEMOGLOBIN: 7.2 g/dL — AB (ref 12.0–15.0)
MCH: 22.8 pg — ABNORMAL LOW (ref 26.0–34.0)
MCHC: 31.3 g/dL (ref 30.0–36.0)
MCV: 72.8 fL — ABNORMAL LOW (ref 78.0–100.0)
Platelets: 216 10*3/uL (ref 150–400)
RBC: 3.16 MIL/uL — AB (ref 3.87–5.11)
RDW: 15.5 % (ref 11.5–15.5)
WBC: 3.4 10*3/uL — AB (ref 4.0–10.5)

## 2015-05-16 LAB — BASIC METABOLIC PANEL
ANION GAP: 5 (ref 5–15)
BUN: 8 mg/dL (ref 6–20)
CHLORIDE: 92 mmol/L — AB (ref 101–111)
CO2: 30 mmol/L (ref 22–32)
CREATININE: 0.63 mg/dL (ref 0.44–1.00)
Calcium: 8.3 mg/dL — ABNORMAL LOW (ref 8.9–10.3)
GFR calc non Af Amer: >60 mL/min (ref >60)
Glucose, Bld: 102 mg/dL — ABNORMAL HIGH (ref 65–99)
Potassium: 3.9 mmol/L (ref 3.5–5.1)
SODIUM: 127 mmol/L — AB (ref 135–145)

## 2015-05-16 LAB — MRSA PCR SCREENING: MRSA BY PCR: NEGATIVE

## 2015-05-16 MED ORDER — FUROSEMIDE 10 MG/ML IJ SOLN
20.0000 mg | Freq: Once | INTRAMUSCULAR | Status: AC
  Administered 2015-05-16: 20 mg via INTRAVENOUS
  Filled 2015-05-16: qty 2

## 2015-05-16 MED ORDER — ENSURE ENLIVE PO LIQD
237.0000 mL | Freq: Two times a day (BID) | ORAL | Status: DC
  Administered 2015-05-16 – 2015-05-21 (×7): 237 mL via ORAL

## 2015-05-16 MED ORDER — SODIUM CHLORIDE 1 G PO TABS
1.0000 g | ORAL_TABLET | Freq: Two times a day (BID) | ORAL | Status: DC
  Administered 2015-05-16 – 2015-05-17 (×2): 1 g via ORAL
  Filled 2015-05-16 (×4): qty 1

## 2015-05-16 MED ORDER — INFLUENZA VAC SPLIT QUAD 0.5 ML IM SUSY
0.5000 mL | PREFILLED_SYRINGE | INTRAMUSCULAR | Status: AC
  Administered 2015-05-18: 0.5 mL via INTRAMUSCULAR
  Filled 2015-05-16 (×2): qty 0.5

## 2015-05-16 NOTE — Consult Note (Signed)
Reason for Consult:right leg wound Referring Physician: F. Erlinda Hong  MD  Holly Allen is an 81 y.o. female.  HPI: 81 year old female with multiple medical problems including CVA hypertension confusion low O2 sat history of COPD with 10 x 16 cm anterior leg injury after likely a fall. Patient had blood blisters at one point and has some eschar present. Patient does have some compression stockings at home per chart history. Hyponatremia noted on admission. Patient does not recall any specific fall.  Past Medical History  Diagnosis Date  . Hypertension   . TIA (transient ischemic attack)     history without residual deficits  . Leukocytosis   . Iron deficiency anemia   . COPD (chronic obstructive pulmonary disease) ([place])   . Pneumonia     "couple times" (12/05/2013)  . History of blood transfusion 2013    "low blood pressure after hip OR"  . Pernicious anemia     hx; "did B12 shots q month for years; must have outgrew it"  . Scarlet fever     "as a child"  . Stroke [facility]) X 2-3    "didn't leave her w/any permanent problems that we know of" (12/05/2013)  . Chronic lower back pain   . UTI (lower urinary tract infection) 01/2013; 12/05/2013    "severe";   . 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 Hard of hearing     Past Surgical History  Procedure Laterality Date  . Hip arthroplasty  03/17/2012    Procedure: ARTHROPLASTY BIPOLAR HIP;  Surgeon: Mcarthur Rossetti, MD;  Location: [place];  Service: Orthopedics;  Laterality: Right;  . Ankle fracture surgery    . Appendectomy    . Cataract extraction w/ intraocular lens  implant, bilateral Bilateral   . Kyphoplasty  X 3  . Back surgery    . Lumbar disc surgery  X 2  . Abdominal hysterectomy  07/1971    No family history on file.  Social History:  reports that she has never smoked. She has never used smokeless tobacco. She reports that she does not drink alcohol or use illicit drugs.  Allergies:   Allergies  Allergen Reactions  . Codeine     vomits  . Other Other (See Comments)    Muscle relaxant may have caused her stroke per family    Medications: I have reviewed the patient's current medications.  Results for orders placed or performed during the hospital encounter of 05/14/15 (from the past 48 hour(s))  CBC WITH DIFFERENTIAL     Status: Abnormal   Collection Time: 05/14/15  1:49 PM  Result Value Ref Range   WBC 4.0 4.0 - 10.5 K/uL   RBC 3.75 (L) 3.87 - 5.11 MIL/uL   Hemoglobin 8.7 (L) 12.0 - 15.0 g/dL   HCT 27.2 (L) 36.0 - 46.0 %   MCV 72.5 (L) 78.0 - 100.0 fL   MCH 23.2 (L) 26.0 - 34.0 pg   MCHC 32.0 30.0 - 36.0 g/dL   RDW 15.4 11.5 - 15.5 %   Platelets 280 150 - 400 K/uL   Neutrophils Relative % 53 %   Neutro Abs 2.1 1.7 - 7.7 K/uL   Lymphocytes Relative 27 %   Lymphs Abs 1.1 0.7 - 4.0 K/uL   Monocytes Relative 19 %   Monocytes Absolute 0.8 0.1 - 1.0 K/uL   Eosinophils Relative 1 %   Eosinophils Absolute 0.1 0.0 -  0.7 K/uL   Basophils Relative 0 %   Basophils Absolute 0.0 0.0 - 0.1 K/uL  Basic metabolic panel     Status: Abnormal   Collection Time: 05/14/15  1:49 PM  Result Value Ref Range   Sodium 126 (L) 135 - 145 mmol/L   Potassium 3.9 3.5 - 5.1 mmol/L   Chloride 89 (L) 101 - 111 mmol/L   CO2 28 22 - 32 mmol/L   Glucose, Bld 88 65 - 99 mg/dL   BUN 12 6 - 20 mg/dL   Creatinine, Ser 0.51 0.44 - 1.00 mg/dL   Calcium 8.6 (L) 8.9 - 10.3 mg/dL   GFR calc non Af Amer >60 >60 mL/min   GFR calc Af Amer >60 >60 mL/min    Comment: (NOTE) The eGFR has been calculated using the CKD EPI equation. This calculation has not been validated in all clinical situations. eGFR's persistently <60 mL/min signify possible Chronic Kidney Disease.    Anion gap 9 5 - 15  Brain natriuretic peptide     Status: Abnormal   Collection Time: 05/14/15  1:49 PM  Result Value Ref Range   B Natriuretic Peptide 198.8 (H) 0.0 - 100.0 pg/mL  I-Stat Troponin, ED (not at [facility])      Status: None   Collection Time: 05/14/15  1:54 PM  Result Value Ref Range   Troponin i, poc 0.01 0.00 - 0.08 ng/mL   Comment 3            Comment: Due to the release kinetics of cTnI, a negative result within the first hours of the onset of symptoms does not rule out myocardial infarction with certainty. If myocardial infarction is still suspected, repeat the test at appropriate intervals.   Culture, blood (routine x 2)     Status: None (Preliminary result)   Collection Time: 05/14/15  7:30 PM  Result Value Ref Range   Specimen Description BLOOD LEFT ARM    Special Requests BOTTLES DRAWN AEROBIC AND ANAEROBIC  10CC    Culture      NO GROWTH < 24 HOURS Performed at [facility]    Report Status PENDING   Culture, blood (routine x 2)     Status: None (Preliminary result)   Collection Time: 05/14/15  7:37 PM  Result Value Ref Range   Specimen Description BLOOD RIGHT HAND    Special Requests IN PEDIATRIC BOTTLE  2CC    Culture      NO GROWTH < 24 HOURS Performed at [facility]    Report Status PENDING   Urinalysis, Routine w reflex microscopic (not at [facility])     Status: Abnormal   Collection Time: 05/15/15 12:39 AM  Result Value Ref Range   Color, Urine YELLOW YELLOW   APPearance CLOUDY (A) CLEAR   Specific Gravity, Urine 1.015 1.005 - 1.030   pH 7.0 5.0 - 8.0   Glucose, UA NEGATIVE NEGATIVE mg/dL   Hgb urine dipstick SMALL (A) NEGATIVE   Bilirubin Urine NEGATIVE NEGATIVE   Ketones, ur NEGATIVE NEGATIVE mg/dL   Protein, ur NEGATIVE NEGATIVE mg/dL   Urobilinogen, UA 2.0 (H) 0.0 - 1.0 mg/dL   Nitrite NEGATIVE NEGATIVE   Leukocytes, UA SMALL (A) NEGATIVE  Urine microscopic-add on     Status: Abnormal   Collection Time: 05/15/15 12:39 AM  Result Value Ref Range   Squamous Epithelial / LPF 6-30 (A) NONE SEEN    Comment: Please note change in reference range.   WBC, UA 0-5 0 -  5 WBC/hpf    Comment: Please note change in reference range.   RBC / HPF 0-5 0 - 5  RBC/hpf    Comment: Please note change in reference range.   Bacteria, UA RARE (A) NONE SEEN    Comment: Please note change in reference range.  Basic metabolic panel     Status: Abnormal   Collection Time: 05/15/15  5:20 AM  Result Value Ref Range   Sodium 127 (L) 135 - 145 mmol/L   Potassium 4.1 3.5 - 5.1 mmol/L   Chloride 94 (L) 101 - 111 mmol/L   CO2 30 22 - 32 mmol/L   Glucose, Bld 92 65 - 99 mg/dL   BUN 11 6 - 20 mg/dL   Creatinine, Ser 0.57 0.44 - 1.00 mg/dL   Calcium 8.2 (L) 8.9 - 10.3 mg/dL   GFR calc non Af Amer >60 >60 mL/min   GFR calc Af Amer >60 >60 mL/min    Comment: (NOTE) The eGFR has been calculated using the CKD EPI equation. This calculation has not been validated in all clinical situations. eGFR's persistently <60 mL/min signify possible Chronic Kidney Disease.    Anion gap 3 (L) 5 - 15  CBC     Status: Abnormal   Collection Time: 05/15/15  5:20 AM  Result Value Ref Range   WBC 4.1 4.0 - 10.5 K/uL   RBC 3.18 (L) 3.87 - 5.11 MIL/uL   Hemoglobin 7.4 (L) 12.0 - 15.0 g/dL   HCT 23.1 (L) 36.0 - 46.0 %   MCV 72.6 (L) 78.0 - 100.0 fL   MCH 23.3 (L) 26.0 - 34.0 pg   MCHC 32.0 30.0 - 36.0 g/dL   RDW 15.4 11.5 - 15.5 %   Platelets 244 150 - 400 K/uL  CBC     Status: Abnormal   Collection Time: 05/16/15  5:25 AM  Result Value Ref Range   WBC 3.4 (L) 4.0 - 10.5 K/uL   RBC 3.16 (L) 3.87 - 5.11 MIL/uL   Hemoglobin 7.2 (L) 12.0 - 15.0 g/dL   HCT 23.0 (L) 36.0 - 46.0 %   MCV 72.8 (L) 78.0 - 100.0 fL   MCH 22.8 (L) 26.0 - 34.0 pg   MCHC 31.3 30.0 - 36.0 g/dL   RDW 15.5 11.5 - 15.5 %   Platelets 216 150 - 400 K/uL  Basic metabolic panel     Status: Abnormal   Collection Time: 05/16/15  5:25 AM  Result Value Ref Range   Sodium 127 (L) 135 - 145 mmol/L   Potassium 3.9 3.5 - 5.1 mmol/L   Chloride 92 (L) 101 - 111 mmol/L   CO2 30 22 - 32 mmol/L   Glucose, Bld 102 (H) 65 - 99 mg/dL   BUN 8 6 - 20 mg/dL   Creatinine, Ser 0.63 0.44 - 1.00 mg/dL   Calcium 8.3 (L)  8.9 - 10.3 mg/dL   GFR calc non Af Amer >60 >60 mL/min   GFR calc Af Amer >60 >60 mL/min    Comment: (NOTE) The eGFR has been calculated using the CKD EPI equation. This calculation has not been validated in all clinical situations. eGFR's persistently <60 mL/min signify possible Chronic Kidney Disease.    Anion gap 5 5 - 15    Dg Chest 2 View  05/14/2015  CLINICAL DATA:  Shortness of breath. Nonhealing wound on the lower leg. EXAM: CHEST  2 VIEW COMPARISON:  Chest x-ray dated 05/04/2015 and 03/10/2014 and 04/10/2010 FINDINGS: There is marked  tortuosity and calcification of the thoracic aorta and there is chronic prominence of the main pulmonary arteries consistent with pulmonary arterial hypertension. COPD. Slight atelectasis at both lung bases with a small new left pleural effusion. Diffuse osteopenia with multiple old compression fractures in the spine. IMPRESSION: Slight bibasilar atelectasis with a new small left effusion. Aortic atherosclerosis. Electronically Signed   By: Lorriane Shire M.D.   On: 05/14/2015 14:19   Dg Tibia/fibula Right  05/14/2015  CLINICAL DATA:  Nonhealing wound mid anterior right tib-fib. EXAM: RIGHT TIBIA AND FIBULA - 2 VIEW COMPARISON:  None. FINDINGS: Significant soft tissue edema. Vascular calcifications are present. No acute fracture or subluxation. No radiopaque foreign body or soft tissue gas. IMPRESSION: Significant soft tissue swelling. Electronically Signed   By: Nolon Nations M.D.   On: 05/14/2015 14:34    Review of Systems  Constitutional: Negative for fever and chills.  Eyes: Negative for blurred vision.  Respiratory: Negative for cough.   Cardiovascular: Positive for leg swelling.  Gastrointestinal: Negative for heartburn.  Genitourinary:       Positive for incontinence  Skin: Negative for itching.  Neurological: Negative for tingling and headaches.  Psychiatric/Behavioral: Positive for memory loss.   Blood pressure 144/58, pulse 74,  temperature 98.3 F (36.8 C), temperature source Oral, resp. rate 20, height _0  (1.626 m), weight 56.564 kg (124 lb 11.2 oz), SpO2 100 %. Physical Exam  Constitutional: She appears well-developed.  HENT:  Head: Atraumatic.  Eyes: EOM are normal.  Neck: Normal range of motion.  Cardiovascular: Normal rate.   Respiratory: Effort normal.  GI: Soft. There is no tenderness.  Musculoskeletal: She exhibits edema and tenderness.  10 x 16 anterior wound area with the previous subcutaneous hematoma now with central eschar. There is mild edema of the extremity pulses intact compartment is soft. Patient has active toe and ankle dorsiflexion plantar flexion. Good capillary refill. Gastrocsoleus is normal.  Neurological: She is alert.  Skin: Skin is warm. There is erythema.  10 x 16 cm area anterolateral leg. There is some eschar present from skin necrosis. There is no drainage no purulence.    Assessment/Plan:  Eschar present with the negative x-rays for fracture no evidence of soft tissue gas. Treatment withl Xeroform Kerlex and elevation of the leg is appropriate.  The eschar may eventually require debridement. There is no area at  this time that requires drainage or debribement. I would be glad to follow and see in 2 wks.      Office # [phone]  .     Thank you.   My cell [phone] Marybelle Killings 05/16/2015, 7:20 AM

## 2015-05-16 NOTE — Evaluation (Signed)
Physical Therapy Evaluation Patient Details Name: Holly Allen MRN: [id number] DOB: 04-May-1915 Today's Date: 05/16/2015   History of Present Illness  81 y.o. female with PMH significant for dementia, HTN, TIA, and COPD on oxygen at home, who was recently seen 05/05/15 for right lower leg pain and swelling secondary to hematoma and presented back to [facility] ED with progressively worsening pain since dsicharge from ED 11/4.  Clinical Impression  Pt admitted with above diagnosis. Pt currently with functional limitations due to the deficits listed below (see PT Problem List). Pt will benefit from skilled PT to increase their independence and safety with mobility to allow discharge to the venue listed below.       Follow Up Recommendations SNF;Supervision/Assistance - 24 hour    Equipment Recommendations  None recommended by PT    Recommendations for Other Services       Precautions / Restrictions Precautions Precautions: Fall Precaution Comments: painful R leg, had shingles on l side, LUE is limited ROM      Mobility  Bed Mobility Overal bed mobility: Needs Assistance Bed Mobility: Supine to Sit;Sit to Supine;Rolling Rolling: Max assist   Supine to sit: Max assist Sit to supine: Max assist   General bed mobility comments: use of bed pad to  move patient around to bed edge  and for legs onto the bed   Transfers                 General transfer comment: did not attempt  Ambulation/Gait                Stairs            Wheelchair Mobility    Modified Rankin (Stroke Patients Only)       Balance Overall balance assessment: History of Falls;Needs assistance Sitting-balance support: Single extremity supported;Feet supported Sitting balance-Leahy Scale: Fair Sitting balance - Comments: sat x 8 minutes                                     Pertinent Vitals/Pain Faces Pain Scale: Hurts little more Pain Descriptors / Indicators:  Discomfort;Grimacing;Guarding    Home Living Family/patient expects to be discharged to:: Skilled nursing facility Living Arrangements: Children               Additional Comments: recently total care,     Prior Function Level of Independence: Needs assistance   Gait / Transfers Assistance Needed: non ambulatory 2 person assist for  transfers., mostly bed bound, mobilized in WC at times.  Family reports significant decline in mobility recently.           Hand Dominance        Extremity/Trunk Assessment   Upper Extremity Assessment: LUE deficits/detail       LUE Deficits / Details: decreased elbow extension, decreased  shoulder elevation   Lower Extremity Assessment: RLE deficits/detail;LLE deficits/detail RLE Deficits / Details: knee flexes  to 80 sitting on edge, lifts leg from bed LLE Deficits / Details:  tp lift leg from bed  Cervical / Trunk Assessment: Kyphotic  Communication   Communication: HOH  Cognition Arousal/Alertness: Awake/alert Behavior During Therapy: WFL for tasks assessed/performed Overall Cognitive Status: History of cognitive impairments - at baseline                      General Comments      Exercises  Assessment/Plan    PT Assessment Patient needs continued PT services  PT Diagnosis Generalized weakness;Acute pain;Altered mental status   PT Problem List Decreased strength;Decreased range of motion;Decreased activity tolerance;Decreased balance;Decreased mobility;Decreased safety awareness;Decreased cognition  PT Treatment Interventions Functional mobility training;Therapeutic activities;Therapeutic exercise;Patient/family education   PT Goals (Current goals can be found in the Care Plan section) Acute Rehab PT Goals Patient Stated Goal: agreed to sit up PT Goal Formulation: With patient/family Time For Goal Achievement: 05/30/15 Potential to Achieve Goals: Good    Frequency Min 3X/week   Barriers to discharge         Co-evaluation               End of Session   Activity Tolerance: Patient tolerated treatment well Patient left: in bed;with call bell/phone within reach;with bed alarm set;with family/visitor present Nurse Communication: Mobility status         Time: 1342-1415 PT Time Calculation (min) (ACUTE ONLY): 33 min   Charges:   PT Evaluation $Initial PT Evaluation Tier I: 1 Procedure PT Treatments $Therapeutic Activity: 8-22 mins   PT G Codes:        Marcelino Freestone PT [id number]   05/16/2015, 3:09 PM

## 2015-05-16 NOTE — Care Management Note (Signed)
Case Management Note  Patient Details  Name: JORI JAMIL MRN: [id number] Date of Birth: 10-13-1914  Subjective/Objective:          81 yo admitted with Cellulitis          Action/Plan: From home with son Inocente Salles and has [facility] [place] services. PT is now recommending SNF. CSW alerted of PT recommendations and will see pt regarding SNF placement.  Expected Discharge Date:   (UNKNOWN)               Expected Discharge Plan:  Skilled Nursing Facility  In-House Referral:  Clinical Social Work  Discharge planning Services  CM Consult  Post Acute Care Choice:    Choice offered to:     DME Arranged:    DME Agency:     HH Arranged:    [place] Agency:     Status of Service:  Completed, signed off  Medicare Important Message Given:    Date Medicare IM Given:    Medicare IM give by:    Date Additional Medicare IM Given:    Additional Medicare Important Message give by:     If discussed at [place] of Stay Meetings, dates discussed:    Additional Comments:  Lynnell Catalan, RN 05/16/2015, 3:59 PM

## 2015-05-16 NOTE — Progress Notes (Signed)
Patient ID: Holly Allen, female   DOB: 1915-01-15, 81 y.o.   MRN: [id number]  [facility] PROGRESS NOTE  XYMENA MCCALMAN [id number] DOB: [birth year] DOA: 05/14/2015 PCP: Woody Seller, MD   Brief narrative:    81 y.o. female with PMH significant for dementia, HTN, TIA, and COPD on oxygen at home, who was recently seen 05/05/15 for right lower leg pain and swelling secondary to hematoma and presented back to [facility] ED with progressively worsening pain since dsicharge. Pain is constant and throbbing, 7/10 in severity, located on anterior shin of the RLE, associated with blistering and surrounding redness, swelling, pain to touch, no specific alleviating factors. Pt's family reports pt is mostly bed bound and needs assistance with all ADL's. Denies chest pain, shortness of breath, N/V, abdominal pain, or fevers. Upon arrival, patient was on NRB due to low oxygen saturation, 85-91% on 10L. She normally requires 3L [place] at home. SBP in 150's, blood work notable for Na 126. [facility] asked to admit for further evaluation.   Assessment/Plan:    Active Problems:  Right lower extremity wound with cellulitis - placed on broad spectrum ABX vanc and Zosyn - appreciate wound care team assistance, ortho team consulted as well, appreciate input  - follow up on blood cultures - narrow down ABX as clinically indicated    Chronic hypoxic respiratory failure - COPD, keep on oxygen via  3-4 L   Hyponatermia - slowly improving with IVF -likely from poor oral intake, will try salt tabs, hold ivf due to pleural effusion and bilateral lower extremity edema on cxr. , component of fluids overload? Iv lasix 20mg  iv x1 today, reassess in am,   Anemia of chronic disease, IDA - drop in Hg since admission, likely dilutional  - transfuse for Hg < 7 - CBC in AM   Essential HTN - continue home medical regimen   H/o bilateral embolic CVA, not a candidate for anticoagulation due to fall risk, h/o  dementia, oriented to person, not to place or time.   FTT, very frail elderly demented female, over all poor prognosis. Family reported patient has been bed bound, incontinence  For several month , family reported "patient seems is loosing ground" family is interested in palliative care /home hospice once the cellulitis treated.   DVT prophylaxis - change Lovenox to SCD   Code Status: DNR Family Communication:  Son at bedside Disposition Plan: snf  IV access:  Peripheral IV  Procedures and diagnostic studies:    Dg Chest 2 View  05/14/2015  CLINICAL DATA:  Shortness of breath. Nonhealing wound on the lower leg. EXAM: CHEST  2 VIEW COMPARISON:  Chest x-ray dated 05/04/2015 and 03/10/2014 and 04/10/2010 FINDINGS: There is marked tortuosity and calcification of the thoracic aorta and there is chronic prominence of the main pulmonary arteries consistent with pulmonary arterial hypertension. COPD. Slight atelectasis at both lung bases with a small new left pleural effusion. Diffuse osteopenia with multiple old compression fractures in the spine. IMPRESSION: Slight bibasilar atelectasis with a new small left effusion. Aortic atherosclerosis. Electronically Signed   By: Lorriane Shire M.D.   On: 05/14/2015 14:19   Dg Tibia/fibula Right  05/14/2015  CLINICAL DATA:  Nonhealing wound mid anterior right tib-fib. EXAM: RIGHT TIBIA AND FIBULA - 2 VIEW COMPARISON:  None. FINDINGS: Significant soft tissue edema. Vascular calcifications are present. No acute fracture or subluxation. No radiopaque foreign body or soft tissue gas. IMPRESSION: Significant soft tissue swelling. Electronically Signed   By: Benjamine Mola  Owens Shark M.D.   On: 05/14/2015 14:34   Dg Chest Port 1 View  05/05/2015  CLINICAL DATA:  Dyspnea 1 year.  Hypertension. EXAM: PORTABLE CHEST 1 VIEW COMPARISON:  03/10/2014 FINDINGS: There is a small left pleural effusion. There is generalized interstitial coarsening which appears chronic. No confluent  airspace consolidation. No pneumothorax. IMPRESSION: Small left pleural effusion. Electronically Signed   By: Andreas Newport M.D.   On: 05/05/2015 00:00    Medical Consultants:  Ortho Wound care  Palliative care Other Consultants:  PT  IAnti-Infectives:   Vancomycin 11/12 --> Zosyn 11/13 -->   Deysi Soldo, MD PhD [facility] Pager [phone]  If 7PM-7AM, please contact night-coverage [email or web address] Password [facility] 05/16/2015, 9:32 AM   LOS: 2 days   HPI/Subjective: No events overnight. Patient only oriented to person, does follow command, drift to sleep when not disturbed,  Objective: Filed Vitals:   05/15/15 1500 05/15/15 2051 05/15/15 2206 05/16/15 0554  BP: 103/72  131/47 144/58  Pulse: 82  82 74  Temp: 98.1 F (36.7 C)  97.8 F (36.6 C) 98.3 F (36.8 C)  TempSrc: Oral  Oral Oral  Resp:    20  Height:      Weight:    124 lb 11.2 oz (56.564 kg)  SpO2: 96% 95% 100% 100%    Intake/Output Summary (Last 24 hours) at 05/16/15 0932 Last data filed at 05/15/15 1840  Gross per 24 hour  Intake   1245 ml  Output      0 ml  Net   1245 ml    Exam:   General:  Frail, drift to sleep when not disturbed, but follows commands  When asked to  Cardiovascular: ectopic beats, SEM 3/6, no rubs, no gallops  Respiratory: crackles at basis, no wheezing, overall diminished breath sounds  Abdomen: Soft, non tender, non distended, bowel sounds present, no guarding  Extremities: RLE wound with two blood filled blisters, 16 x 10 cm with surro=unding erythema and TTP , left ankle/pedal edema  Data Reviewed: Basic Metabolic Panel:  Recent Labs Lab 05/14/15 1349 05/15/15 0520 05/16/15 0525  NA 126* 127* 127*  K 3.9 4.1 3.9  CL 89* 94* 92*  CO2 28 30 30   GLUCOSE 88 92 102*  BUN 12 11 8   CREATININE 0.51 0.57 0.63  CALCIUM 8.6* 8.2* 8.3*   CBC:  Recent Labs Lab 05/14/15 1349 05/15/15 0520 05/16/15 0525  WBC 4.0 4.1 3.4*  NEUTROABS 2.1  --   --   HGB 8.7* 7.4* 7.2*  HCT  27.2* 23.1* 23.0*  MCV 72.5* 72.6* 72.8*  PLT 280 244 216   Recent Results (from the past 240 hour(s))  Culture, blood (routine x 2)     Status: None (Preliminary result)   Collection Time: 05/14/15  7:30 PM  Result Value Ref Range Status   Specimen Description BLOOD LEFT ARM  Final   Special Requests BOTTLES DRAWN AEROBIC AND ANAEROBIC  10CC  Final   Culture   Final    NO GROWTH < 24 HOURS Performed at [facility]    Report Status PENDING  Incomplete  Culture, blood (routine x 2)     Status: None (Preliminary result)   Collection Time: 05/14/15  7:37 PM  Result Value Ref Range Status   Specimen Description BLOOD RIGHT HAND  Final   Special Requests IN PEDIATRIC BOTTLE  [place]  Final   Culture   Final    NO GROWTH < 24 HOURS Performed at [facility]  Report Status PENDING  Incomplete     Scheduled Meds: . clopidogrel  75 mg Oral Q breakfast  . feeding supplement (ENSURE ENLIVE)  237 mL Oral BID BM  . gabapentin  100 mg Oral QHS  . ipratropium-albuterol  3 mL Nebulization BID  . metoprolol  50 mg Oral BID  . piperacillin-tazobactam (ZOSYN)  IV  3.375 g Intravenous 3 times per day  . sodium chloride  3 mL Intravenous Q12H  . traMADol  25 mg Oral QHS  . vancomycin  750 mg Intravenous Q24H   Continuous Infusions:

## 2015-05-16 NOTE — Consult Note (Signed)
Consultation Note Date: 05/16/2015   Patient Name: Holly Allen  DOB: Jun 21, 1915  MRN: [id number]  Age / Sex: 81 y.o., female  PCP: Holly Sacramento, MD Referring Physician: Florencia Reasons, MD  Reason for Consultation: Establishing goals of care    Clinical Assessment/Narrative: Holly Allen was sleeping when I came in room today - daughters Holly Allen and Juday at bedside. They tell me she has been declining to the point where it has become too difficult for them to care for her at home. They say she had shingles of left arm a few months ago and has greatly declined since this illness with weakness, sleeping more, needing assistance with all ADLs, and overall decrease in functional status. Although they say that she still eats well except for last 1-2 days in the hospital she has skipped some meals. Discussed signs of dementia and they say that she does call her son, who lives with her, by her husband's name at times. Discussed expectations and further signs of decline.   We also discussed overall prognosis in relation to her wishes and how aggressive we should be with her care. They seem to believe that she has an Advance Directive but are unsure of where this is located. We discussed decisions as well as MOST form. One daughter is very much more directed to comfort and clearly wants her mother to be Holly Allen and mostly comfort care. The other daughter has more questions about these decisions. Initially they were considering code status and said they believe their brother said "try this (resuscitation) once" but after further discussion they agree on Holly Allen status. No further decisions were made today and MOST not completed as they will need to discuss this more as a family.   They are also interested in getting her to SNF - mostly for wound care. They do not believe they will be able to bring her back home. We did discuss the different avenues of  hospice including at home, in SNF, and hospice facility and when these services are appropriate. They are very interested in involving these services to assist with her care and comfort. Right now she appears comfortable and goal is to continue antibiotics to treat infection and hopeful for some improvement in her right leg and then watchful waiting to see if she will improve some or if she is approaching her EOL.   Contacts/Participants in Discussion: Primary Decision Maker: 3 children, Holly Allen, Holly Allen, Holly Allen   Relationship to Patient children   SUMMARY OF RECOMMENDATIONS  Code Status/Advance Care Planning: Holly Allen    Code Status Orders        Start     Ordered   05/16/15 1001  Do not attempt resuscitation (Holly Allen)   Continuous    Question Answer Comment  In the event of cardiac or respiratory ARREST Do not call a "code blue"   In the event of cardiac or respiratory ARREST Do not perform Intubation, CPR, defibrillation or ACLS   In the event of cardiac or respiratory ARREST Use medication by any route, position, wound care, and other measures to relive pain and suffering. May use oxygen, suction and manual treatment of airway obstruction as needed for comfort.      05/16/15 1000    Advance Directive Documentation        Most Recent Value   Type of Advance Directive  Living will, Healthcare Power of Attorney   Pre-existing out of facility Holly Allen order (yellow form or pink MOST form)     "  MOST" Form in Place?          Symptom Management:   No current symptoms.  Decreased appetite: They say she will not drink Ensure but likes [employer].   Palliative Prophylaxis:   Bowel Regimen, Delirium Protocol, Frequent Pain Assessment, Oral Care and Turn Reposition   Psycho-social/Spiritual:  Support System: Strong Desire for further Chaplaincy support:no Additional Recommendations: Caregiving  Support/Resources and Education on Hospice  Prognosis: < 6 months  Discharge Planning: [place] for rehab with Palliative care service follow-up   Chief Complaint/ Primary Diagnoses: Present on Admission:  . Cellulitis . COPD (chronic obstructive pulmonary disease) ([place]) . Hyponatremia . Iron deficiency anemia . Protein-calorie malnutrition, severe ([place])  I have reviewed the medical record, interviewed the patient and family, and examined the patient. The following aspects are pertinent.  Past Medical History  Diagnosis Date  . Hypertension   . TIA (transient ischemic attack)     history without residual deficits  . Leukocytosis   . Iron deficiency anemia   . COPD (chronic obstructive pulmonary disease) ([place])   . Pneumonia     "couple times" (12/05/2013)  . History of blood transfusion 2013    "low blood pressure after hip OR"  . Pernicious anemia     hx; "did B12 shots q month for years; must have outgrew it"  . Scarlet fever     "as a child"  . Stroke [facility]) X 2-3    "didn't leave her w/any permanent problems that we know of" (12/05/2013)  . Chronic lower back pain   . UTI (lower urinary tract infection) 01/2013; 12/05/2013    "severe";   . 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 Hard of hearing    Social History   Social History  . Marital Status: Widowed    Spouse Name: N/A  . Number of Children: N/A  . Years of Education: N/A   Social History Main Topics  . Smoking status: Never Smoker   . Smokeless tobacco: Never Used  . Alcohol Use: No  . Drug Use: No  . Sexual Activity: Not Asked   Other Topics Concern  . None   Social History Narrative   No family history on file. Scheduled Meds: . clopidogrel  75 mg Oral Q breakfast  . feeding supplement (ENSURE ENLIVE)  237 mL Oral BID BM  . gabapentin  100 mg Oral QHS  . [START ON 05/17/2015] Influenza vac split quadrivalent PF  0.5 mL Intramuscular Tomorrow-1000  . ipratropium-albuterol  3 mL Nebulization BID  . metoprolol  50 mg Oral BID  .  piperacillin-tazobactam (ZOSYN)  IV  3.375 g Intravenous 3 times per day  . sodium chloride  3 mL Intravenous Q12H  . sodium chloride  1 g Oral BID WC  . traMADol  25 mg Oral QHS  . vancomycin  750 mg Intravenous Q24H   Continuous Infusions:  PRN Meds:.HYDROcodone-acetaminophen, ondansetron **OR** ondansetron (ZOFRAN) IV Medications Prior to Admission:  Prior to Admission medications   Medication Sig Start Date End Date Taking? Authorizing Provider  cholecalciferol (VITAMIN D) 1000 UNITS tablet Take 1,000 Units by mouth daily.   Yes Historical Provider, MD  furosemide (LASIX) 20 MG tablet Take 0.5 tablets (10 mg total) by mouth daily. 05/05/15  Yes Julianne Rice, MD  gabapentin (NEURONTIN) 100 MG capsule Take 100 mg by mouth at bedtime.   Yes Historical Provider, MD  HYDROcodone-acetaminophen (NORCO/VICODIN) 5-325 MG per tablet Take 0.5 tablets by mouth 2 (two) times daily as needed (pain).   Yes Historical Provider, MD  ipratropium-albuterol (DUONEB) 0.5-2.5 (3) MG/3ML SOLN Take 3 mLs by nebulization 2 (two) times daily.   Yes Historical Provider, MD  metoprolol (LOPRESSOR) 50 MG tablet Take 50 mg by mouth 2 (two) times daily.   Yes Historical Provider, MD  traMADol (ULTRAM) 50 MG tablet Take 25 mg by mouth at bedtime.   Yes Historical Provider, MD  cephALEXin (KEFLEX) 500 MG capsule Take 1 capsule (500 mg total) by mouth 3 (three) times daily. Patient not taking: Reported on 05/14/2015 05/05/15   Julianne Rice, MD  clopidogrel (PLAVIX) 75 MG tablet Take 75 mg by mouth daily with breakfast.    Historical Provider, MD  HYDROcodone-acetaminophen (NORCO/VICODIN) 5-325 MG per tablet Take 1 tablet by mouth every 4 (four) hours as needed. Patient not taking: Reported on 05/04/2015 03/10/14   Dorie Rank, MD  levofloxacin (LEVAQUIN) 500 MG tablet Take 1 tablet (500 mg total) by mouth every other day. Patient not taking: Reported on 05/04/2015 12/10/13   Donne Hazel, MD  predniSONE (DELTASONE) 20 MG  tablet Take 2 tablets (40 mg total) by mouth daily with breakfast. Patient not taking: Reported on 05/04/2015 12/09/13   Donne Hazel, MD   Allergies  Allergen Reactions  . Codeine     vomits  . Other Other (See Comments)    Muscle relaxant may have caused her stroke per family    Review of Systems  Unable to perform ROS   Physical Exam  Constitutional: She appears well-developed. She appears lethargic.  Cardiovascular: Normal rate.   Respiratory: Effort normal. No accessory muscle usage. No tachypnea. No respiratory distress.  GI: Soft. Normal appearance. She exhibits no distension.  Neurological: She appears lethargic. She is disoriented.    Vital Signs: BP 130/52 mmHg  Pulse 74  Temp(Src) 97.7 F (36.5 C) (Oral)  Resp 20  Ht 5\' 4"  (1.626 m)  Wt 56.564 kg (124 lb 11.2 oz)  BMI 21.39 kg/m2  SpO2 99%  SpO2: SpO2: 99 % O2 Device:SpO2: 99 % O2 Flow Rate: .O2 Flow Rate (L/min): 2 L/min  IO: Intake/output summary:  Intake/Output Summary (Last 24 hours) at 05/16/15 1557 Last data filed at 05/16/15 1049  Gross per 24 hour  Intake   1048 ml  Output      0 ml  Net   1048 ml    LBM: Last BM Date: 05/15/15 Baseline Weight: Weight: 48.988 kg (108 lb) Most recent weight: Weight: 56.564 kg (124 lb 11.2 oz)      Palliative Assessment/Data:    Additional Data Reviewed:  CBC:    Component Value Date/Time   WBC 3.4* 05/16/2015 0525   HGB 7.2* 05/16/2015 0525   HCT 23.0* 05/16/2015 0525   PLT 216 05/16/2015 0525   MCV 72.8* 05/16/2015 0525   NEUTROABS 2.1 05/14/2015 1349   LYMPHSABS 1.1 05/14/2015 1349   MONOABS 0.8 05/14/2015 1349   EOSABS 0.1 05/14/2015 1349   BASOSABS 0.0 05/14/2015 1349   Comprehensive Metabolic Panel:    Component Value Date/Time   NA 127* 05/16/2015 0525   K 3.9 05/16/2015 0525   CL 92* 05/16/2015 0525   CO2 30 05/16/2015 0525   BUN 8 05/16/2015 0525   CREATININE 0.63 05/16/2015 0525   GLUCOSE 102* 05/16/2015 0525   CALCIUM 8.3*  05/16/2015 0525   AST 17 05/04/2015 2346   ALT 8* 05/04/2015 2346  ALKPHOS 95 05/04/2015 2346   BILITOT 0.5 05/04/2015 2346   PROT 5.3* 05/04/2015 2346   ALBUMIN 2.9* 05/04/2015 2346     Time In: 1440 Time Out: 1600 Time Total: 47min Greater than 50%  of this time was spent counseling and coordinating care related to the above assessment and plan.  Signed by: Pershing Proud, NP  Pershing Proud, NP  [id number], 3:57 PM  Please contact Palliative Medicine Team phone at [phone] for questions and concerns.

## 2015-05-16 NOTE — Progress Notes (Signed)
Initial Nutrition Assessment  DOCUMENTATION CODES:   Non-severe (moderate) malnutrition in context of chronic illness  INTERVENTION:  - Will order Ensure Enlive po BID, each supplement provides 350 kcal and 20 grams of protein - Encourage intakes of meals and supplements - RD will continue to monitor for needs  NUTRITION DIAGNOSIS:   Increased nutrient needs related to wound healing as evidenced by estimated needs.  GOAL:   Patient will meet greater than or equal to 90% of their needs  MONITOR:   PO intake, Supplement acceptance, Weight trends, Labs, Skin, I & O's  REASON FOR ASSESSMENT:   Consult Assessment of nutrition requirement/status  ASSESSMENT:   81 y.o. female with PMH significant for dementia, HTN, TIA, and COPD on oxygen at home, who was recently seen 05/05/15 for right lower leg pain and swelling secondary to hematoma and presented back to [facility] ED with progressively worsening pain since dsicharge. Pain is constant and throbbing, 7/10 in severity, located on anterior shin of the RLE, associated with blistering and surrounding redness, swelling, pain to touch, no specific alleviating factors. Pt's family reports pt is mostly bed bound and needs assistance with all ADL's. Denies chest pain, shortness of breath, N/V, abdominal pain, or fevers. Upon arrival, patient was on NRB due to low oxygen saturation, 85-91% on 10L. She normally requires 3L [place] at home. SBP in 150's, blood work notable for Na 126. TRH asked to admit for further evaluation.   Pt seen for consult. BMI indicates normal weight. Per chart review, pt has gained 20 lbs in the past 14 months. Pt with hx of dementia. She was unable to provide any relevant information at this time and no family/visitors at bedside.   Per chart review, pt refused breakfast and ate 75% of lunch and 25% of dinner yesterday (11/15). She states she had breakfast this AM but was unable to give any further details. Pt on Regular diet but  noted several missing teeth; question if pt needs softer foods related to this.   Physical assessment showed mild fat and moderate and severe muscle wasting to upper body. Moderate edema to BLE with cellulitis present.  Unsure if pt was meeting needs PTA. Will order Ensure to supplement. Medications reviewed. Labs reviewed; Na: 127 mmol/L, Cl: 92 mmol/L, Ca: 8.3 mg/dL.    Diet Order:  Diet regular Room service appropriate?: Yes; Fluid consistency:: Thin  Skin:  Wound (see comment) (R leg wound, Head laceration from fall 12/05/2013)  Last BM:  11/15  Height:   Ht Readings from Last 1 Encounters:  05/14/15 5\' 4"  (1.626 m)    Weight:   Wt Readings from Last 1 Encounters:  05/16/15 124 lb 11.2 oz (56.564 kg)    Ideal Body Weight:  54.54 kg (kg)  BMI:  Body mass index is 21.39 kg/(m^2).  Estimated Nutritional Needs:   Kcal:  1250-1450  Protein:  45-55 grams  Fluid:  2 L/day  EDUCATION NEEDS:   No education needs identified at this time     Jarome Matin, RD, LDN Inpatient Clinical Dietitian Pager # [phone] After hours/weekend pager # [phone]

## 2015-05-17 LAB — CBC
HEMATOCRIT: 25 % — AB (ref 36.0–46.0)
HEMOGLOBIN: 7.8 g/dL — AB (ref 12.0–15.0)
MCH: 22.9 pg — AB (ref 26.0–34.0)
MCHC: 31.2 g/dL (ref 30.0–36.0)
MCV: 73.5 fL — AB (ref 78.0–100.0)
Platelets: 219 10*3/uL (ref 150–400)
RBC: 3.4 MIL/uL — AB (ref 3.87–5.11)
RDW: 15.6 % — ABNORMAL HIGH (ref 11.5–15.5)
WBC: 3.7 10*3/uL — ABNORMAL LOW (ref 4.0–10.5)

## 2015-05-17 LAB — COMPREHENSIVE METABOLIC PANEL
ALK PHOS: 75 U/L (ref 38–126)
ALT: 8 U/L — AB (ref 14–54)
ANION GAP: 8 (ref 5–15)
AST: 13 U/L — ABNORMAL LOW (ref 15–41)
Albumin: 2.6 g/dL — ABNORMAL LOW (ref 3.5–5.0)
BILIRUBIN TOTAL: 0.6 mg/dL (ref 0.3–1.2)
BUN: 8 mg/dL (ref 6–20)
CALCIUM: 8.4 mg/dL — AB (ref 8.9–10.3)
CO2: 31 mmol/L (ref 22–32)
CREATININE: 0.6 mg/dL (ref 0.44–1.00)
Chloride: 90 mmol/L — ABNORMAL LOW (ref 101–111)
Glucose, Bld: 113 mg/dL — ABNORMAL HIGH (ref 65–99)
Potassium: 3.6 mmol/L (ref 3.5–5.1)
SODIUM: 129 mmol/L — AB (ref 135–145)
TOTAL PROTEIN: 5 g/dL — AB (ref 6.5–8.1)

## 2015-05-17 LAB — MAGNESIUM: Magnesium: 1.6 mg/dL — ABNORMAL LOW (ref 1.7–2.4)

## 2015-05-17 MED ORDER — FUROSEMIDE 10 MG/ML IJ SOLN
20.0000 mg | Freq: Once | INTRAMUSCULAR | Status: AC
  Administered 2015-05-17: 20 mg via INTRAVENOUS
  Filled 2015-05-17: qty 2

## 2015-05-17 MED ORDER — MAGNESIUM SULFATE 2 GM/50ML IV SOLN
2.0000 g | Freq: Once | INTRAVENOUS | Status: AC
  Administered 2015-05-17: 2 g via INTRAVENOUS
  Filled 2015-05-17: qty 50

## 2015-05-17 MED ORDER — SODIUM CHLORIDE 1 G PO TABS
1.0000 g | ORAL_TABLET | Freq: Three times a day (TID) | ORAL | Status: DC
  Administered 2015-05-17 – 2015-05-20 (×9): 1 g via ORAL
  Filled 2015-05-17 (×13): qty 1

## 2015-05-17 NOTE — NC FL2 (Signed)
[place] LEVEL OF CARE SCREENING TOOL     IDENTIFICATION  Patient Name: Holly Allen Birthdate: 03-20-1915 Sex: female Admission Date (Current Location): 05/14/2015  [facility] and [place] Number: [place]  (Pt does not have Medicaid) Facility and Address:  [facility],  [place] [address] [place]      Provider Number: [id number]  Attending Physician Name and Address:  Florencia Reasons, MD  Relative Name and Phone Number:       Current Level of Care: Hospital Recommended Level of Care: [place] Prior Approval Number:    Date Approved/Denied:   PASRR Number: [id number]  Discharge Plan: SNF    Current Diagnoses: Patient Active Problem List   Diagnosis Date Noted  . Malnutrition of moderate degree 05/16/2015  . Palliative care encounter 05/16/2015  . Dementia 05/16/2015  . Cellulitis 05/14/2015  . Cellulitis of right lower extremity   . Non-healing wound of lower extremity   . Protein-calorie malnutrition, severe ([place]) 12/07/2013  . COPD with acute exacerbation ([place]) 12/06/2013  . UTI (urinary tract infection) 02/02/2013  . Hyponatremia 02/02/2013  . Iron deficiency anemia 03/21/2012  . COPD (chronic obstructive pulmonary disease) ([place]) 03/19/2012  . Fracture of femoral neck, right ([place]) 03/17/2012  . Leukocytosis 03/17/2012    Orientation ACTIVITIES/SOCIAL BLADDER RESPIRATION    Place, Self  Family supportive Incontinent  (2 L/min continuous)  BEHAVIORAL SYMPTOMS/MOOD NEUROLOGICAL BOWEL NUTRITION STATUS      Incontinent Diet (Diet regular Room service appropriate; Fluid consistency: Thin )  PHYSICIAN VISITS COMMUNICATION OF NEEDS Height & Weight Skin    Verbally 5\' 4"  (162.6 cm) 122 lbs. Skin abrasions, Other (Comment) (Right lower extremity wound with cellulitis treatment Cover hematoma with xeroform gauze, secure with kerlix. Change every other day.)          AMBULATORY STATUS RESPIRATION    Assist extensive   (2 L/min continuous)      Personal Care Assistance Level of Assistance  Dressing, Feeding, Bathing Bathing Assistance: Maximum assistance Feeding assistance: Maximum assistance Dressing Assistance: Maximum assistance      Functional Limitations Info  Sight, Speech Sight Info: Adequate Hearing Info: Impaired (Hard of hearing ) Speech Info: Adequate       SPECIAL CARE FACTORS FREQUENCY  PT (By licensed PT), OT (By licensed OT)     PT Frequency: 5x week OT Frequency: 5x week           Additional Factors Info  Code Status, Allergies Code Status Info: DNR Allergies Info: Codeine (Codeine, Other: Muscle relaxant may have caused her stroke per family)           Current Medications (05/17/2015): Current Facility-Administered Medications  Medication Dose Route Frequency Provider Last Rate Last Dose  . clopidogrel (PLAVIX) tablet 75 mg  75 mg Oral Q breakfast Theodis Blaze, MD   75 mg at 05/17/15 0735  . feeding supplement (ENSURE ENLIVE) (ENSURE ENLIVE) liquid 237 mL  237 mL Oral BID BM Maricela Bo [place], RD   237 mL at 05/16/15 1049  . gabapentin (NEURONTIN) capsule 100 mg  100 mg Oral QHS Theodis Blaze, MD   100 mg at 05/16/15 2222  . HYDROcodone-acetaminophen (NORCO/VICODIN) 5-325 MG per tablet 0.5 tablet  0.5 tablet Oral BID PRN Theodis Blaze, MD   0.5 tablet at 05/15/15 1145  . Influenza vac split quadrivalent PF (FLUARIX) injection 0.5 mL  0.5 mL Intramuscular Tomorrow-1000 Florencia Reasons, MD      . ipratropium-albuterol (DUONEB) 0.5-2.5 (  3) MG/3ML nebulizer solution 3 mL  3 mL Nebulization BID Theodis Blaze, MD   3 mL at 05/17/15 0847  . magnesium sulfate IVPB 2 g 50 mL  2 g Intravenous Once Florencia Reasons, MD   2 g at 05/17/15 1157  . metoprolol (LOPRESSOR) tablet 50 mg  50 mg Oral BID Theodis Blaze, MD   50 mg at 05/17/15 1026  . ondansetron (ZOFRAN) tablet 4 mg  4 mg Oral Q6H PRN Theodis Blaze, MD       Or  . ondansetron [facility]) injection 4 mg  4 mg Intravenous Q6H PRN Theodis Blaze, MD      . piperacillin-tazobactam (ZOSYN) IVPB 3.375 g  3.375 g Intravenous 3 times per day Theodis Blaze, MD   3.375 g at 05/17/15 1024  . sodium chloride 0.9 % injection 3 mL  3 mL Intravenous Q12H Theodis Blaze, MD   3 mL at 05/16/15 2200  . sodium chloride tablet 1 g  1 g Oral TID WC Florencia Reasons, MD   1 g at 05/17/15 1157  . traMADol (ULTRAM) tablet 25 mg  25 mg Oral QHS Theodis Blaze, MD   25 mg at 05/16/15 2222  . vancomycin (VANCOCIN) IVPB 750 mg/150 ml premix  750 mg Intravenous Q24H Theodis Blaze, MD   750 mg at 05/16/15 1554   Do not use this list as official medication orders. Please verify with discharge summary.  Discharge Medications:   Medication List    ASK your doctor about these medications        cephALEXin 500 MG capsule  Commonly known as:  KEFLEX  Take 1 capsule (500 mg total) by mouth 3 (three) times daily.     cholecalciferol 1000 UNITS tablet  Commonly known as:  VITAMIN D  Take 1,000 Units by mouth daily.     clopidogrel 75 MG tablet  Commonly known as:  PLAVIX  Take 75 mg by mouth daily with breakfast.     furosemide 20 MG tablet  Commonly known as:  LASIX  Take 0.5 tablets (10 mg total) by mouth daily.     gabapentin 100 MG capsule  Commonly known as:  NEURONTIN  Take 100 mg by mouth at bedtime.     HYDROcodone-acetaminophen 5-325 MG tablet  Commonly known as:  NORCO/VICODIN  Take 1 tablet by mouth every 4 (four) hours as needed.     HYDROcodone-acetaminophen 5-325 MG tablet  Commonly known as:  NORCO/VICODIN  Take 0.5 tablets by mouth 2 (two) times daily as needed (pain).     ipratropium-albuterol 0.5-2.5 (3) MG/3ML Soln  Commonly known as:  DUONEB  Take 3 mLs by nebulization 2 (two) times daily.     levofloxacin 500 MG tablet  Commonly known as:  LEVAQUIN  Take 1 tablet (500 mg total) by mouth every other day.     metoprolol 50 MG tablet  Commonly known as:  LOPRESSOR  Take 50 mg by mouth 2 (two) times daily.     predniSONE 20 MG  tablet  Commonly known as:  DELTASONE  Take 2 tablets (40 mg total) by mouth daily with breakfast.     traMADol 50 MG tablet  Commonly known as:  ULTRAM  Take 25 mg by mouth at bedtime.        Relevant Imaging Results:  Relevant Lab Results:  Recent Labs    Additional Information SSN: [id number]. Patient to have Palliative Care Services follow at SNF.  Jackilyn Umphlett A, LCSW

## 2015-05-17 NOTE — Progress Notes (Signed)
Date: May 17, 2015 Chart reviewed for concurrent status and case management needs. Will continue to follow patient for changes and needs: Anays Detore, RN, BSN, CCM   [phone] 

## 2015-05-17 NOTE — Progress Notes (Signed)
ANTIBIOTIC CONSULT NOTE - Follow-Up  Pharmacy Consult for Vancomycin, Zosyn Indication: Wound Infection/cellulitis  Allergies  Allergen Reactions  . Codeine     vomits  . Other Other (See Comments)    Muscle relaxant may have caused her stroke per family    Patient Measurements: Height: 5\' 4"  (162.6 cm) Weight: 122 lb 12.7 oz (55.7 kg) IBW/kg (Calculated) : 54.7  Vital Signs: Temp: 97.4 F (36.3 C) (11/17 0634) Temp Source: Axillary (11/17 0634) BP: 132/65 mmHg (11/17 0634) Pulse Rate: 69 (11/17 0634) Intake/Output from previous day: 11/16 0701 - 11/17 0700 In: 543 [P.O.:120; I.V.:123; IV Piggyback:300] Out: -  Intake/Output from this shift:    Labs:  Recent Labs  05/15/15 0520 05/16/15 0525 05/17/15 0535  WBC 4.1 3.4* 3.7*  HGB 7.4* 7.2* 7.8*  PLT 244 216 219  CREATININE 0.57 0.63 0.60   Estimated Creatinine Clearance: 33.9 mL/min (by C-G formula based on Cr of 0.6). No results for input(s): VANCOTROUGH, VANCOPEAK, VANCORANDOM, GENTTROUGH, GENTPEAK, GENTRANDOM, TOBRATROUGH, TOBRAPEAK, TOBRARND, AMIKACINPEAK, AMIKACINTROU, AMIKACIN in the last 72 hours.   Microbiology: Recent Results (from the past 720 hour(s))  Culture, blood (routine x 2)     Status: None (Preliminary result)   Collection Time: 05/14/15  7:30 PM  Result Value Ref Range Status   Specimen Description BLOOD LEFT ARM  Final   Special Requests BOTTLES DRAWN AEROBIC AND ANAEROBIC  10CC  Final   Culture   Final    NO GROWTH 2 DAYS Performed at [facility]    Report Status PENDING  Incomplete  Culture, blood (routine x 2)     Status: None (Preliminary result)   Collection Time: 05/14/15  7:37 PM  Result Value Ref Range Status   Specimen Description BLOOD RIGHT HAND  Final   Special Requests IN PEDIATRIC BOTTLE  [place]  Final   Culture   Final    NO GROWTH 2 DAYS Performed at [facility]    Report Status PENDING  Incomplete  MRSA PCR Screening     Status: None   Collection  Time: 05/16/15  1:13 PM  Result Value Ref Range Status   MRSA by PCR NEGATIVE NEGATIVE Final    Comment:        The GeneXpert MRSA Assay (FDA approved for NASAL specimens only), is one component of a comprehensive MRSA colonization surveillance program. It is not intended to diagnose MRSA infection nor to guide or monitor treatment for MRSA infections.     Medical History: Past Medical History  Diagnosis Date  . Hypertension   . TIA (transient ischemic attack)     history without residual deficits  . Leukocytosis   . Iron deficiency anemia   . COPD (chronic obstructive pulmonary disease) ([place])   . Pneumonia     "couple times" (12/05/2013)  . History of blood transfusion 2013    "low blood pressure after hip OR"  . Pernicious anemia     hx; "did B12 shots q month for years; must have outgrew it"  . Scarlet fever     "as a child"  . Stroke [facility]) X 2-3    "didn't leave her w/any permanent problems that we know of" (12/05/2013)  . Chronic lower back pain   . UTI (lower urinary tract infection) 01/2013; 12/05/2013    "severe";   . 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 Hard of hearing  Assessment: 54 y/oF presents to ED with CC right leg pain with worsening swelling, erythema, and weeping wound. Patient recently treated for shingles, UTI and hematoma on right lower leg. Pharmacy consulted to assist with dosing of Vancomycin and Zosyn for treatment of wound infection/cellulitis.  11/14 >> vanc >> 11/14 >> zosyn >>  11/14 blood x 2: NGTD 11/16 MRSA PCR: negative  Goal of Therapy:  Vancomycin trough level 15-20 mcg/ml  Appropriate antibiotic dosing for renal function and indication Eradication of infection  Plan:   Continue Vancomycin 750mg  IV q24h.   Check Vancomycin trough level prior to dose today.  Continue Zosyn 3.375g IV q8h (infuse over 4 hours).  Monitor renal function, cultures, clinical course.   Lindell Spar,  PharmD, BCPS Pager: [phone] 05/17/2015 10:24 AM

## 2015-05-17 NOTE — Progress Notes (Signed)
Patient ID: Holly Allen, female   DOB: 03/27/1915, 81 y.o.   MRN: [id number]  [facility] PROGRESS NOTE  Holly Allen [id number] DOB: 02/18/1915 DOA: 05/14/2015 PCP: Woody Seller, MD   Brief narrative:    81 y.o. female with PMH significant for dementia, HTN, TIA, and COPD on oxygen at home, who was recently seen 05/05/15 for right lower leg pain and swelling secondary to hematoma and presented back to [facility] ED with progressively worsening pain since dsicharge. Pain is constant and throbbing, 7/10 in severity, located on anterior shin of the RLE, associated with blistering and surrounding redness, swelling, pain to touch, no specific alleviating factors. Pt's family reports pt is mostly bed bound and needs assistance with all ADL's. Denies chest pain, shortness of breath, N/V, abdominal pain, or fevers. Upon arrival, patient was on NRB due to low oxygen saturation, 85-91% on 10L. She normally requires 3L [place] at home. SBP in 150's, blood work notable for Na 126. TRH asked to admit for further evaluation.   Assessment/Plan:    Active Problems:  Right lower extremity wound with cellulitis - placed on broad spectrum ABX vanc and Zosyn - appreciate wound care team assistance, ortho team consulted as well, appreciate input  -  blood cultures no growth, mrsa screen negative - consider to stop vanc tomorrow,    Chronic hypoxic respiratory failure - COPD, keep on oxygen via [place] 3-4 L   Hyponatermia - slowly improving with IVF -likely from poor oral intake, will try salt tabs, hold ivf due to pleural effusion and bilateral lower extremity edema on cxr. , component of fluids overload? Iv lasix 20mg  iv x1 on 11/16 -11/17 na 129, increase salt tabs, additional dose of lasix 20 iv Repeat bmp in am   Anemia of chronic disease, IDA - drop in Hg since admission, likely dilutional  - transfuse for Hg < 7 - hgb stable , range from 7.2 to 7.8   Essential HTN - continue home  medical regimen   H/o bilateral embolic CVA, not a candidate for anticoagulation due to fall risk, h/o dementia, oriented to person, not to place or time. Continue plavix unless bleed.  FTT, very frail elderly demented female, over all poor prognosis. Family reported patient has been bed bound, incontinence  For several month , family reported "patient seems is loosing ground" family is interested in palliative care /home hospice once the cellulitis treated.   DVT prophylaxis - change Lovenox to SCD   Code Status: DNR Family Communication:  Son and two daughters at bedside Disposition Plan: snf  IV access:  Peripheral IV  Procedures and diagnostic studies:    Dg Chest 2 View  05/14/2015  CLINICAL DATA:  Shortness of breath. Nonhealing wound on the lower leg. EXAM: CHEST  2 VIEW COMPARISON:  Chest x-ray dated 05/04/2015 and 03/10/2014 and 04/10/2010 FINDINGS: There is marked tortuosity and calcification of the thoracic aorta and there is chronic prominence of the main pulmonary arteries consistent with pulmonary arterial hypertension. COPD. Slight atelectasis at both lung bases with a small new left pleural effusion. Diffuse osteopenia with multiple old compression fractures in the spine. IMPRESSION: Slight bibasilar atelectasis with a new small left effusion. Aortic atherosclerosis. Electronically Signed   By: Lorriane Shire M.D.   On: 05/14/2015 14:19   Dg Tibia/fibula Right  05/14/2015  CLINICAL DATA:  Nonhealing wound mid anterior right tib-fib. EXAM: RIGHT TIBIA AND FIBULA - 2 VIEW COMPARISON:  None. FINDINGS: Significant soft tissue edema. Vascular calcifications  are present. No acute fracture or subluxation. No radiopaque foreign body or soft tissue gas. IMPRESSION: Significant soft tissue swelling. Electronically Signed   By: Nolon Nations M.D.   On: 05/14/2015 14:34   Dg Chest Port 1 View  05/05/2015  CLINICAL DATA:  Dyspnea 1 year.  Hypertension. EXAM: PORTABLE CHEST 1 VIEW  COMPARISON:  03/10/2014 FINDINGS: There is a small left pleural effusion. There is generalized interstitial coarsening which appears chronic. No confluent airspace consolidation. No pneumothorax. IMPRESSION: Small left pleural effusion. Electronically Signed   By: Andreas Newport M.D.   On: 05/05/2015 00:00    Medical Consultants:  Ortho Wound care  Palliative care Other Consultants:  PT  IAnti-Infectives:   Vancomycin 11/12 --> Zosyn 11/13 -->   Gregrey Bloyd, MD PhD [facility] Pager [phone]  If 7PM-7AM, please contact night-coverage [email or web address] Password [email or web address] 05/17/2015, 5:52 PM   LOS: 3 days     Intake/Output Summary (Last 24 hours) at 05/17/15 1752 Last data filed at 05/17/15 1530  Gross per 24 hour  Intake    432 ml  Output      0 ml  Net    432 ml    Exam:   General:  Frail, drift to sleep when not disturbed, but follows commands  When asked to  Cardiovascular: ectopic beats, SEM 3/6, no rubs, no gallops  Respiratory: crackles at basis, no wheezing, overall diminished breath sounds  Abdomen: Soft, non tender, non distended, bowel sounds present, no guarding  Extremities: RLE edema/ erythema subsiding, still tender, dressing intact, left ankle/pedal edema has almost resolved  Data Reviewed: Basic Metabolic Panel:  Recent Labs Lab 05/14/15 1349 05/15/15 0520 05/16/15 0525 05/17/15 0535  NA 126* 127* 127* 129*  K 3.9 4.1 3.9 3.6  CL 89* 94* 92* 90*  CO2 28 30 30 31   GLUCOSE 88 92 102* 113*  BUN 12 11 8 8   CREATININE 0.51 0.57 0.63 0.60  CALCIUM 8.6* 8.2* 8.3* 8.4*  MG  --   --   --  1.6*   CBC:  Recent Labs Lab 05/14/15 1349 05/15/15 0520 05/16/15 0525 05/17/15 0535  WBC 4.0 4.1 3.4* 3.7*  NEUTROABS 2.1  --   --   --   HGB 8.7* 7.4* 7.2* 7.8*  HCT 27.2* 23.1* 23.0* 25.0*  MCV 72.5* 72.6* 72.8* 73.5*  PLT 280 244 216 219   Recent Results (from the past 240 hour(s))  Culture, blood (routine x 2)     Status: None (Preliminary result)    Collection Time: 05/14/15  7:30 PM  Result Value Ref Range Status   Specimen Description BLOOD LEFT ARM  Final   Special Requests BOTTLES DRAWN AEROBIC AND ANAEROBIC  10CC  Final   Culture   Final    NO GROWTH 3 DAYS Performed at [facility]    Report Status PENDING  Incomplete  Culture, blood (routine x 2)     Status: None (Preliminary result)   Collection Time: 05/14/15  7:37 PM  Result Value Ref Range Status   Specimen Description BLOOD RIGHT HAND  Final   Special Requests IN PEDIATRIC BOTTLE  [place]  Final   Culture   Final    NO GROWTH 3 DAYS Performed at [facility]    Report Status PENDING  Incomplete  MRSA PCR Screening     Status: None   Collection Time: 05/16/15  1:13 PM  Result Value Ref Range Status   MRSA by PCR NEGATIVE NEGATIVE Final  Comment:        The GeneXpert MRSA Assay (FDA approved for NASAL specimens only), is one component of a comprehensive MRSA colonization surveillance program. It is not intended to diagnose MRSA infection nor to guide or monitor treatment for MRSA infections.      Scheduled Meds: . clopidogrel  75 mg Oral Q breakfast  . feeding supplement (ENSURE ENLIVE)  237 mL Oral BID BM  . gabapentin  100 mg Oral QHS  . Influenza vac split quadrivalent PF  0.5 mL Intramuscular Tomorrow-1000  . ipratropium-albuterol  3 mL Nebulization BID  . metoprolol  50 mg Oral BID  . piperacillin-tazobactam (ZOSYN)  IV  3.375 g Intravenous 3 times per day  . sodium chloride  3 mL Intravenous Q12H  . sodium chloride  1 g Oral TID WC  . traMADol  25 mg Oral QHS  . vancomycin  750 mg Intravenous Q24H   Continuous Infusions:

## 2015-05-17 NOTE — Clinical Documentation Improvement (Signed)
Internal Medicine  Can the diagnosis of Respiratory Failure be further specified? Please update your documentation within the medical record to reflect your response to this query. Do not document in BPA drop down box; add response to progress note. Thank you!   Acute on Chronic Hypoxic Respiratory Failure  Chronic Hypoxic Respiratory Failure as documented  Other  Clinically Undetermined  Document any associated diagnoses/conditions.  Supporting Information:  "Upon arrival, patient was on NRB due to low oxygen saturation, 85-91% on 10L. She normally requires 3L [place] at home".   Please exercise your independent, professional judgment when responding. A specific answer is not anticipated or expected.  Thank You,  Zoila Shutter RN, [place] [phone]

## 2015-05-17 NOTE — Clinical Social Work Note (Signed)
Clinical Social Work Assessment  Patient Details  Name: Holly Allen MRN: [id number] Date of Birth: Feb 27, 1915  Date of referral:  05/17/15               Reason for consult:  Facility Placement                Permission sought to share information with:  Family Supports, Customer service manager Permission granted to share information::     Name::     [place]::     Relationship::     Contact Information:     Housing/Transportation Living arrangements for the past 2 months:  Single Family Home Source of Information:  Adult Children Patient Interpreter Needed:  None Criminal Activity/Legal Involvement Pertinent to Current Situation/Hospitalization:  No - Comment as needed Significant Relationships:  Adult Children Lives with:  Adult Children Do you feel safe going back to the place where you live?  No Need for family participation in patient care:  No (Coment)  Care giving concerns:  Pt admitted for leg pain from home with children. PT recommending SNF.   Social Worker assessment / plan:  CSW received referal for New SNF.  BSW Intern met with pt and pt son at bedside. Pt drowsy and unresponsive to assessment. BSW Intern discussed dc plan with pt son. Pt son accepting of SNF search for pt. Pt son prefers a [facility] due to location. Pt son discussed pt had been to [facility] three times in the past for short-term rehab. Pt son would prefer for pt not to go back to [facility]. Pt son also interested in [place] and [facility]. Pt son discussed importance of wound care for pt at SNF placement. Pt son expressed interest in [facility] per wound care.   BSW Intern to conduct a [place] and [facility] search.  CSW to continue to follow and provide support to pt and pt family.   Employment status:  Retired Forensic scientist:  Medicare PT Recommendations:  [place] / Referral to  community resources:  [place]  Patient/Family's Response to care:  Pt is alert and oriented x2. Pt was drowsy and in and out of sleep during assessment. Pt son was at bedside. Pt son was engaging in conversation. Pt son stated he is pleased with the care for pt at the hospital.   Patient/Family's Understanding of and Emotional Response to Diagnosis, Current Treatment, and Prognosis:  Pt son aware and understanding of pt medical condition and accepting of dc plan.  Emotional Assessment Appearance:  Appears stated age Attitude/Demeanor/Rapport:  Unresponsive, Unable to Assess Affect (typically observed):  Quiet, Calm, Unable to Assess Orientation:  Oriented to Place, Oriented to Self Alcohol / Substance use:  Not Applicable Psych involvement (Current and /or in the community):  No (Comment)  Discharge Needs  Concerns to be addressed:  Discharge Planning Concerns Readmission within the last 30 days:  Yes Current discharge risk:  None Barriers to Discharge:  Continued Medical Work up   [employer], Student-SW 05/17/2015, 11:20 AM

## 2015-05-17 NOTE — Clinical Social Work Placement (Signed)
   CLINICAL SOCIAL WORK PLACEMENT  NOTE  Date:  05/17/2015  Patient Details  Name: ADELFA SOBALVARRO MRN: [id number] Date of Birth: 06/19/1915  Clinical Social Work is seeking post-discharge placement for this patient at the [place] level of care (*CSW will initial, date and re-position this form in  chart as items are completed):  Yes   Patient/family provided with [place] Work Department's list of facilities offering this level of care within the geographic area requested by the patient (or if unable, by the patient's family).  Yes   Patient/family informed of their freedom to choose among providers that offer the needed level of care, that participate in Medicare, Medicaid or managed care program needed by the patient, have an available bed and are willing to accept the patient.  Yes   Patient/family informed of [place]'s ownership interest in [facility] and [facility], as well as of the fact that they are under no obligation to receive care at these facilities.  PASRR submitted to EDS on       PASRR number received on       Existing PASRR number confirmed on 05/17/15     FL2 transmitted to all facilities in geographic area requested by pt/family on 05/17/15     FL2 transmitted to all facilities within larger geographic area on       Patient informed that his/her managed care company has contracts with or will negotiate with certain facilities, including the following:            Patient/family informed of bed offers received.  Patient chooses bed at       Physician recommends and patient chooses bed at      Patient to be transferred to   on  .  Patient to be transferred to facility by       Patient family notified on   of transfer.  Name of family member notified:        PHYSICIAN Please sign FL2, Please sign DNR     Additional Comment:    _______________________________________________ Ladell Pier,  LCSW 05/17/2015, 12:30 PM

## 2015-05-17 NOTE — Care Management Important Message (Signed)
Important Message  Patient Details  Name: Holly Allen MRN: [id number] Date of Birth: Feb 24, 1915   Medicare Important Message Given:  Yes    Camillo Flaming 05/17/2015, 3:18 [place] Message  Patient Details  Name: Holly Allen MRN: [id number] Date of Birth: 07-06-1914   Medicare Important Message Given:  Yes    Camillo Flaming 05/17/2015, 3:18 PM

## 2015-05-18 ENCOUNTER — Inpatient Hospital Stay (HOSPITAL_COMMUNITY): Payer: Medicare Other

## 2015-05-18 DIAGNOSIS — L03115 Cellulitis of right lower limb: Secondary | ICD-10-CM | POA: Diagnosis not present

## 2015-05-18 LAB — BASIC METABOLIC PANEL
Anion gap: 6 (ref 5–15)
BUN: 9 mg/dL (ref 6–20)
CHLORIDE: 91 mmol/L — AB (ref 101–111)
CO2: 33 mmol/L — AB (ref 22–32)
CREATININE: 0.59 mg/dL (ref 0.44–1.00)
Calcium: 8.2 mg/dL — ABNORMAL LOW (ref 8.9–10.3)
GFR calc Af Amer: >60 mL/min (ref >60)
GFR calc non Af Amer: >60 mL/min (ref >60)
GLUCOSE: 114 mg/dL — AB (ref 65–99)
POTASSIUM: 3.7 mmol/L (ref 3.5–5.1)
Sodium: 130 mmol/L — ABNORMAL LOW (ref 135–145)

## 2015-05-18 LAB — MAGNESIUM: Magnesium: 1.9 mg/dL (ref 1.7–2.4)

## 2015-05-18 MED ORDER — DOXYCYCLINE HYCLATE 100 MG PO TABS
100.0000 mg | ORAL_TABLET | Freq: Two times a day (BID) | ORAL | Status: DC
  Administered 2015-05-18 – 2015-05-21 (×7): 100 mg via ORAL
  Filled 2015-05-18 (×8): qty 1

## 2015-05-18 MED ORDER — FUROSEMIDE 10 MG/ML IJ SOLN
20.0000 mg | Freq: Once | INTRAMUSCULAR | Status: AC
  Administered 2015-05-18: 20 mg via INTRAVENOUS
  Filled 2015-05-18: qty 2

## 2015-05-18 NOTE — Progress Notes (Signed)
Physical Therapy Treatment Patient Details Name: EMALEE BURK MRN: [id number] DOB: 05-24-1915 Today's Date: 05/18/2015    History of Present Illness 81 y.o. female with PMH significant for dementia, HTN, TIA, and COPD on oxygen at home, who was recently seen 05/05/15 for right lower leg pain and swelling secondary to hematoma and presented back to [facility] ED with progressively worsening pain since dsicharge from ED 11/4.    PT Comments    Pt seen family education regarding PT role/family wishes/mobility goals; pt assisted with repositioning in bed for which she is max assist; Pt son and dtr  are leaning towards pt being comfortable, do not want her to be in pain with mobility- (palliative meeting yesterday) but would like for PT to re-assess at SNF;   PT will sign off, agree with plan to re-eval at SNF;   Follow Up Recommendations  SNF;Supervision/Assistance - 24 hour (with Palliative?)     Equipment Recommendations  None recommended by PT    Recommendations for Other Services       Precautions / Restrictions Precautions Precautions: Fall    Mobility  Bed Mobility               General bed mobility comments: pt attempting to eat, listing to L side and son reports pt does this at her baseline; adjusted her positioning in bed (max assist); pt c/o LE pain and found that tray table was pressing firmly on pt's L knee, raised and pt reported it felt "much better"  Transfers                    Ambulation/Gait                 Stairs            Wheelchair Mobility    Modified Rankin (Stroke Patients Only)       Balance                                    Cognition                            Exercises      General Comments        Pertinent Vitals/Pain      Home Living                      Prior Function            PT Goals (current goals can now be found in the care plan section) Acute Rehab PT  Goals Patient Stated Goal: none stated, discussed goals with pt and family PT Goal Formulation: With family Time For Goal Achievement: 05/30/15 Potential to Achieve Goals: Good Progress towards PT goals:  (family leaning toward comfort measures)    Frequency       PT Plan Current plan remains appropriate;Other (comment) (rec re-eval at SNF)    Co-evaluation             End of Session   Activity Tolerance: Other (comment) (session limited) Patient left: in bed;with call bell/phone within reach;with bed alarm set     Time: 1212-1220 PT Time Calculation (min) (ACUTE ONLY): 8 min  Charges:  $Self Care/Home Management: 8-22  G CodesKenyon Ana 05/18/2015, 12:56 PM

## 2015-05-18 NOTE — Progress Notes (Signed)
Daily Progress Note   Patient Name: Holly Allen       Date: 05/18/2015 DOB: March 03, 1915  Age: 81 y.o. MRN#: [id number] Attending Physician: Florencia Reasons, MD Primary Care Physician: Woody Seller, MD Admit Date: 05/14/2015  Reason for Consultation/Follow-up: Establishing goals of care  Subjective: I met today with Holly Allen's son, Holly Allen. He appears very understanding of his mother's decline as he has been living with her for years now. He says he was a Airline pilot and has seen many people be resuscitated "that had no business going through that." He agrees we should focus on keeping her comfort. Discussed MOST. All questions/concerns addressed.   MOST complete: DNR, comfort measures (avoid hospitalization), give antibiotics, consider trial of IVF, no feeding tube.    Length of Stay: 4 days  Current Medications: Scheduled Meds:  . clopidogrel  75 mg Oral Q breakfast  . feeding supplement (ENSURE ENLIVE)  237 mL Oral BID BM  . gabapentin  100 mg Oral QHS  . ipratropium-albuterol  3 mL Nebulization BID  . metoprolol  50 mg Oral BID  . piperacillin-tazobactam (ZOSYN)  IV  3.375 g Intravenous 3 times per day  . sodium chloride  3 mL Intravenous Q12H  . sodium chloride  1 g Oral TID WC  . traMADol  25 mg Oral QHS  . vancomycin  750 mg Intravenous Q24H    Continuous Infusions:    PRN Meds: HYDROcodone-acetaminophen, ondansetron **OR** ondansetron (ZOFRAN) IV  Physical Exam: Physical Exam  Constitutional: She appears well-developed. She appears lethargic.  HENT:  Head: Normocephalic.  Cardiovascular: Regular rhythm.   Pulmonary/Chest: Effort normal. No accessory muscle usage. No tachypnea. No respiratory distress.  Abdominal: Soft. Normal appearance. She exhibits no  distension.  Neurological: She appears lethargic. She is disoriented.  Skin:  RLE erythema, swelling - dressing dry and intact.   Psychiatric: She has a normal mood and affect.                Vital Signs: BP 118/52 mmHg  Pulse 81  Temp(Src) 98.1 F (36.7 C) (Oral)  Resp 17  Ht $R'5\' 4"'Qd$  (1.626 m)  Wt 55.7 kg (122 lb 12.7 oz)  BMI 21.07 kg/m2  SpO2 99% SpO2: SpO2: 99 % O2 Device: O2 Device: Nasal Cannula O2 Flow Rate: O2 Flow Rate (L/min): 2  L/min  Intake/output summary:   Intake/Output Summary (Last 24 hours) at 05/18/15 1146 Last data filed at 05/18/15 0500  Gross per 24 hour  Intake    242 ml  Output      0 ml  Net    242 ml   LBM: Last BM Date: 05/16/15 Baseline Weight: Weight: 48.988 kg (108 lb) Most recent weight: Weight: 55.7 kg (122 lb 12.7 oz)       Palliative Assessment/Data: Flowsheet Rows        Most Recent Value   Intake Tab    Referral Department  Hospitalist   Unit at Time of Referral  Med/Surg Unit   Palliative Care Primary Diagnosis  Sepsis/Infectious Disease   Date Notified  05/16/15   Palliative Care Type  New Palliative care   Reason for referral  Clarify Goals of Care, Counsel Regarding Hospice   Date of Admission  05/14/15   Date first seen by Palliative Care  05/16/15   # of days Palliative referral response time  0 Day(s)   # of days IP prior to Palliative referral  2   Clinical Assessment    Psychosocial & Spiritual Assessment    Palliative Care Outcomes       Additional Data Reviewed: CBC    Component Value Date/Time   WBC 3.7* 05/17/2015 0535   RBC 3.40* 05/17/2015 0535   HGB 7.8* 05/17/2015 0535   HCT 25.0* 05/17/2015 0535   PLT 219 05/17/2015 0535   MCV 73.5* 05/17/2015 0535   MCH 22.9* 05/17/2015 0535   MCHC 31.2 05/17/2015 0535   RDW 15.6* 05/17/2015 0535   LYMPHSABS 1.1 05/14/2015 1349   MONOABS 0.8 05/14/2015 1349   EOSABS 0.1 05/14/2015 1349   BASOSABS 0.0 05/14/2015 1349    CMP     Component Value Date/Time   NA  130* 05/18/2015 0500   K 3.7 05/18/2015 0500   CL 91* 05/18/2015 0500   CO2 33* 05/18/2015 0500   GLUCOSE 114* 05/18/2015 0500   BUN 9 05/18/2015 0500   CREATININE 0.59 05/18/2015 0500   CALCIUM 8.2* 05/18/2015 0500   PROT 5.0* 05/17/2015 0535   ALBUMIN 2.6* 05/17/2015 0535   AST 13* 05/17/2015 0535   ALT 8* 05/17/2015 0535   ALKPHOS 75 05/17/2015 0535   BILITOT 0.6 05/17/2015 0535   GFRNONAA >60 05/18/2015 0500   GFRAA >60 05/18/2015 0500       Problem List:  Patient Active Problem List   Diagnosis Date Noted  . Malnutrition of moderate degree 05/16/2015  . Palliative care encounter 05/16/2015  . Dementia 05/16/2015  . Cellulitis 05/14/2015  . Cellulitis of right lower extremity   . Non-healing wound of lower extremity   . Protein-calorie malnutrition, severe ([place]) 12/07/2013  . COPD with acute exacerbation ([place]) 12/06/2013  . UTI (urinary tract infection) 02/02/2013  . Hyponatremia 02/02/2013  . Iron deficiency anemia 03/21/2012  . COPD (chronic obstructive pulmonary disease) ([place]) 03/19/2012  . Fracture of femoral neck, right ([place]) 03/17/2012  . Leukocytosis 03/17/2012     Palliative Care Assessment & Plan    1.Code Status:  DNR    Code Status Orders        Start     Ordered   05/16/15 1001  Do not attempt resuscitation (DNR)   Continuous    Question Answer Comment  In the event of cardiac or respiratory ARREST Do not call a "code blue"   In the event of cardiac or respiratory ARREST Do not  perform Intubation, CPR, defibrillation or ACLS   In the event of cardiac or respiratory ARREST Use medication by any route, position, wound care, and other measures to relive pain and suffering. May use oxygen, suction and manual treatment of airway obstruction as needed for comfort.      05/16/15 1000    Advance Directive Documentation        Most Recent Value   Type of Advance Directive  Living will, Healthcare Power of Attorney   Pre-existing out of facility  DNR order (yellow form or pink MOST form)     "MOST" Form in Place?         2. Goals of Care/Additional Recommendations:  Comfort focused care. Treat the treatable.   Limitations on Scope of Treatment: Avoid Hospitalization  Desire for further Chaplaincy support:no  Psycho-social Needs: Education on Hospice  3. Symptom Management:      1. No symptoms to manage.   4. Palliative Prophylaxis:   Bowel Regimen, Delirium Protocol and Turn Reposition  5. Prognosis: < 6 months  6. Discharge Planning:  [facility] for rehab with Palliative care service follow-up. Transition to hospice.    Thank you for allowing the Palliative Medicine Team to assist in the care of this patient.   Time In: 1000 Time Out: 1040 Total Time 36min Prolonged Time Billed  no         Pershing Proud, NP  34/62/1947, 11:46 AM  Please contact Palliative Medicine Team phone at [phone] for questions and concerns.

## 2015-05-18 NOTE — Progress Notes (Signed)
Patient ID: Holly Allen, female   DOB: 11-Apr-1915, 81 y.o.   MRN: [id number]  [facility] PROGRESS NOTE  Holly Allen [id number] DOB: Jun 10, 1915 DOA: 05/14/2015 PCP: Woody Seller, MD   Brief narrative:    81 y.o. female with PMH significant for dementia, HTN, TIA, and COPD on oxygen at home, who was recently seen 05/05/15 for right lower leg pain and swelling secondary to hematoma and presented back to [facility] ED with progressively worsening pain since dsicharge. Pain is constant and throbbing, 7/10 in severity, located on anterior shin of the RLE, associated with blistering and surrounding redness, swelling, pain to touch, no specific alleviating factors. Pt's family reports pt is mostly bed bound and needs assistance with all ADL's. Denies chest pain, shortness of breath, N/V, abdominal pain, or fevers. Upon arrival, patient was on NRB due to low oxygen saturation, 85-91% on 10L. She normally requires 3L [place] at home. SBP in 150's, blood work notable for Na 126. TR[facility] asked to admit for further evaluation.   Interval history: 11/18  Poor oral intake per documentation, though family reported she eats well, family reported notice patient has been coughing, left elbow edema, patient requested pain meds for her right lower leg this am.  Assessment/Plan:    Active Problems:  Right lower extremity wound with cellulitis - venous ultrasound no DVT, placed on broad spectrum ABX vanc and Zosyn - appreciate wound care team assistance, ortho team consulted as well, appreciate input  -  blood cultures no growth, mrsa screen negative - wound looks better today on 11/8, less edema, no new blisters, slowing healing blood filled blisters x2, d/c iv vanc/zosyn, change to oral doxycylin  -need outpatient follow up with ortho Dr. Lorin Mercy   Chronic hypoxic respiratory failure - COPD, keep on oxygen via [place] 3-4 L   Hyponatermia - slowly improving with IVF -likely from poor oral intake, will  try salt tabs, hold ivf due to pleural effusion and bilateral lower extremity edema on cxr. , component of fluids overload? Iv lasix 20mg  iv x1 on 11/16 -11/17 na 129, increase salt tabs, additional dose of lasix 20 iv -11/18, na improving, today at 130, continue salt tabs , lsix 20mg  x1 , Repeat bmp in am   Anemia of chronic disease, IDA - drop in Hg since admission, likely dilutional  - transfuse for Hg < 7 - hgb stable , range from 7.2 to 7.8   Essential HTN - continue home medical regimen   Cough?  cxr ? Small pleural effusion? Additional lasix today on 11/8,, swallow eval  Left elbow edema, seems like soft tissue edema, does not seem to have joint involvement on exam, left elbow x ray pending, family reported h/o shingles on left arm, patient denies pain on left arm or elbow.  H/o bilateral embolic CVA, not a candidate for anticoagulation due to fall risk, h/o dementia, oriented to person, not to place or time. Continue plavix unless bleed.  FTT, very frail elderly demented female, over all poor prognosis. Family reported patient has been bed bound, incontinence  For several month , family reported "patient seems is loosing ground" family is interested in palliative care /home hospice once the cellulitis treated.   DVT prophylaxis - change Lovenox to SCD   Code Status: DNR Family Communication:  Son and two daughters at bedside Disposition Plan: snf  IV access:  Peripheral IV  Procedures and diagnostic studies:    Dg Chest 2 View  05/14/2015  CLINICAL DATA:  Shortness of breath. Nonhealing wound on the lower leg. EXAM: CHEST  2 VIEW COMPARISON:  Chest x-ray dated 05/04/2015 and 03/10/2014 and 04/10/2010 FINDINGS: There is marked tortuosity and calcification of the thoracic aorta and there is chronic prominence of the main pulmonary arteries consistent with pulmonary arterial hypertension. COPD. Slight atelectasis at both lung bases with a small new left pleural effusion.  Diffuse osteopenia with multiple old compression fractures in the spine. IMPRESSION: Slight bibasilar atelectasis with a new small left effusion. Aortic atherosclerosis. Electronically Signed   By: Lorriane Shire M.D.   On: 05/14/2015 14:19   Dg Tibia/fibula Right  05/14/2015  CLINICAL DATA:  Nonhealing wound mid anterior right tib-fib. EXAM: RIGHT TIBIA AND FIBULA - 2 VIEW COMPARISON:  None. FINDINGS: Significant soft tissue edema. Vascular calcifications are present. No acute fracture or subluxation. No radiopaque foreign body or soft tissue gas. IMPRESSION: Significant soft tissue swelling. Electronically Signed   By: Nolon Nations M.D.   On: 05/14/2015 14:34   Dg Chest Port 1 View  05/05/2015  CLINICAL DATA:  Dyspnea 1 year.  Hypertension. EXAM: PORTABLE CHEST 1 VIEW COMPARISON:  03/10/2014 FINDINGS: There is a small left pleural effusion. There is generalized interstitial coarsening which appears chronic. No confluent airspace consolidation. No pneumothorax. IMPRESSION: Small left pleural effusion. Electronically Signed   By: Andreas Newport M.D.   On: 05/05/2015 00:00    Medical Consultants:  Ortho Wound care  Palliative care Other Consultants:  PT  IAnti-Infectives:   Vancomycin 11/12 -->11/18 Zosyn 11/13 -->11/18 doxycline from 11/8  Holly Screws, MD PhD [facility] Pager [phone]  If 7PM-7AM, please contact night-coverage [email or web address] Password [email or web address] 05/18/2015, 1:09 PM   LOS: 4 days     Intake/Output Summary (Last 24 hours) at 05/18/15 1309 Last data filed at 05/18/15 0500  Gross per 24 hour  Intake    230 ml  Output      0 ml  Net    230 ml    Exam:   General:  Frail, drift to sleep when not disturbed, but follows commands  When asked to  Cardiovascular: ectopic beats, SEM 3/6, no rubs, no gallops  Respiratory: crackles at basis, no wheezing, overall diminished breath sounds  Abdomen: Soft, non tender, non distended, bowel sounds present, no  guarding  Extremities: RLE edema/ erythema subsiding, two blood filled blisters slowing healing, no new blisters, still tender, dressing intact, left ankle/pedal edema has  resolved  Data Reviewed: Basic Metabolic Panel:  Recent Labs Lab 05/14/15 1349 05/15/15 0520 05/16/15 0525 05/17/15 0535 05/18/15 0500  NA 126* 127* 127* 129* 130*  K 3.9 4.1 3.9 3.6 3.7  CL 89* 94* 92* 90* 91*  CO2 28 30 30 31  33*  GLUCOSE 88 92 102* 113* 114*  BUN 12 11 8 8 9   CREATININE 0.51 0.57 0.63 0.60 0.59  CALCIUM 8.6* 8.2* 8.3* 8.4* 8.2*  MG  --   --   --  1.6* 1.9   CBC:  Recent Labs Lab 05/14/15 1349 05/15/15 0520 05/16/15 0525 05/17/15 0535  WBC 4.0 4.1 3.4* 3.7*  NEUTROABS 2.1  --   --   --   HGB 8.7* 7.4* 7.2* 7.8*  HCT 27.2* 23.1* 23.0* 25.0*  MCV 72.5* 72.6* 72.8* 73.5*  PLT 280 244 216 219   Recent Results (from the past 240 hour(s))  Culture, blood (routine x 2)     Status: None (Preliminary result)   Collection Time: 05/14/15  7:30 PM  Result Value Ref Range  Status   Specimen Description BLOOD LEFT ARM  Final   Special Requests BOTTLES DRAWN AEROBIC AND ANAEROBIC  10CC  Final   Culture   Final    NO GROWTH 4 DAYS Performed at [facility]    Report Status PENDING  Incomplete  Culture, blood (routine x 2)     Status: None (Preliminary result)   Collection Time: 05/14/15  7:37 PM  Result Value Ref Range Status   Specimen Description BLOOD RIGHT HAND  Final   Special Requests IN PEDIATRIC BOTTLE  [place]  Final   Culture   Final    NO GROWTH 4 DAYS Performed at [facility]    Report Status PENDING  Incomplete  MRSA PCR Screening     Status: None   Collection Time: 05/16/15  1:13 PM  Result Value Ref Range Status   MRSA by PCR NEGATIVE NEGATIVE Final    Comment:        The GeneXpert MRSA Assay (FDA approved for NASAL specimens only), is one component of a comprehensive MRSA colonization surveillance program. It is not intended to diagnose  MRSA infection nor to guide or monitor treatment for MRSA infections.      Scheduled Meds: . clopidogrel  75 mg Oral Q breakfast  . feeding supplement (ENSURE ENLIVE)  237 mL Oral BID BM  . gabapentin  100 mg Oral QHS  . ipratropium-albuterol  3 mL Nebulization BID  . metoprolol  50 mg Oral BID  . piperacillin-tazobactam (ZOSYN)  IV  3.375 g Intravenous 3 times per day  . sodium chloride  3 mL Intravenous Q12H  . sodium chloride  1 g Oral TID WC  . traMADol  25 mg Oral QHS  . vancomycin  750 mg Intravenous Q24H   Continuous Infusions:

## 2015-05-18 NOTE — Clinical Social Work Note (Signed)
CSW extended bed offers to pt's son, Inocente Salles.  Son explained that his sister will be arriving soon and they will discuss the bed offers.  CSW will continue to follow and assist with discharge planning needs.  [place], [place]

## 2015-05-19 LAB — CBC
HEMATOCRIT: 23.4 % — AB (ref 36.0–46.0)
Hemoglobin: 7.3 g/dL — ABNORMAL LOW (ref 12.0–15.0)
MCH: 23.1 pg — AB (ref 26.0–34.0)
MCHC: 31.2 g/dL (ref 30.0–36.0)
MCV: 74.1 fL — AB (ref 78.0–100.0)
PLATELETS: 200 10*3/uL (ref 150–400)
RBC: 3.16 MIL/uL — AB (ref 3.87–5.11)
RDW: 15.6 % — AB (ref 11.5–15.5)
WBC: 5.1 10*3/uL (ref 4.0–10.5)

## 2015-05-19 LAB — BASIC METABOLIC PANEL
Anion gap: 6 (ref 5–15)
BUN: 14 mg/dL (ref 6–20)
CALCIUM: 8.6 mg/dL — AB (ref 8.9–10.3)
CO2: 36 mmol/L — ABNORMAL HIGH (ref 22–32)
Chloride: 91 mmol/L — ABNORMAL LOW (ref 101–111)
Creatinine, Ser: 0.73 mg/dL (ref 0.44–1.00)
GFR calc Af Amer: >60 mL/min (ref >60)
GLUCOSE: 158 mg/dL — AB (ref 65–99)
Potassium: 3.6 mmol/L (ref 3.5–5.1)
Sodium: 133 mmol/L — ABNORMAL LOW (ref 135–145)

## 2015-05-19 LAB — CULTURE, BLOOD (ROUTINE X 2)
CULTURE: NO GROWTH
Culture: NO GROWTH

## 2015-05-19 LAB — MAGNESIUM: Magnesium: 1.8 mg/dL (ref 1.7–2.4)

## 2015-05-19 NOTE — Clinical Social Work Note (Signed)
CSW called and spoke with pt's son who is POA to obtain SNF choice.  Pt's son stated that MD told him that pt would be here until "first of the week".  Pt's first choice is [facility], [facility] is [facility] and third is [facility].  CSW will follow up with SNF's to assess for availability andl continue to follow up with pt until she is ready for discharge.  Dede Query, LCSW [place] Worker - Weekend Coverage cell #: [phone]

## 2015-05-19 NOTE — Evaluation (Signed)
Clinical/Bedside Swallow Evaluation Patient Details  Name: Holly Allen MRN: [id number] Date of Birth: Aug 30, 1914  Today's Date: 05/19/2015 Time: SLP Start Time (ACUTE ONLY): [id number] SLP Stop Time (ACUTE ONLY): 1547 SLP Time Calculation (min) (ACUTE ONLY): 34 min  Past Medical History:  Past Medical History  Diagnosis Date  . Hypertension   . TIA (transient ischemic attack)     history without residual deficits  . Leukocytosis   . Iron deficiency anemia   . COPD (chronic obstructive pulmonary disease) ([place])   . Pneumonia     "couple times" (12/05/2013)  . History of blood transfusion 2013    "low blood pressure after hip OR"  . Pernicious anemia     hx; "did B12 shots q month for years; must have outgrew it"  . Scarlet fever     "as a child"  . Stroke [facility]) X 2-3    "didn't leave her w/any permanent problems that we know of" (12/05/2013)  . Chronic lower back pain   . UTI (lower urinary tract infection) 01/2013; 12/05/2013    "severe";   . 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 Hard of hearing    Past Surgical History:  Past Surgical History  Procedure Laterality Date  . Hip arthroplasty  03/17/2012    Procedure: ARTHROPLASTY BIPOLAR HIP;  Surgeon: Holly Rossetti, MD;  Location: [facility];  Service: Orthopedics;  Laterality: Right;  . Ankle fracture surgery    . Appendectomy    . Cataract extraction w/ intraocular lens  implant, bilateral Bilateral   . Kyphoplasty  X 3  . Back surgery    . Lumbar disc surgery  X 2  . Abdominal hysterectomy  07/1971   HPI:  81 y.o. female with PMH significant for dementia, HTN, TIA, CVAs and COPD on oxygen at home, who was recently seen 05/05/15 for right lower leg pain and swelling secondary to hematoma and presented back to [facility] ED with progressively worsening pain since dsicharge. Family reports poor by mouth intake and coughing with pos. CXR with Low lung volumes with mild patchy bilateral lower lobe  opacities,   Assessment / Plan / Recommendation Clinical Impression  Swallow evaluation complete. Patient presents with an oropharyngeal dysphagia, likely chronic in nature, exacerbated by fatigue and acute illness. Orally, patient with missing dentition, fatigue, and decreased sustained attention resulting in severely prolonged A-P transit of bolus and oral residuals. Pharyngeally, patient with a suspected delay in swallow initiation resulting in signs of decreased airway protection with thin liquids, decreasing with trials of nectar thick liquid. Notable that po trials in general limited by polite refusal. Discussed above with daughter via phone as patient at risk for both aspiration and dehydration/malnutrition given length of time required to consume a meal. Per daughter, eating well at home, although meals lengthy in nature. In light of advanced age and what appears to be at least some degree of chronic dysphagia, discussed options including continuation of current diet, diet adjustment to decrease risk of aspiration and increase efficiency with po intake, and completion of instrumental testing. Decision to modify diet to mechanical soft with nectar thick liquids at this time. Will f/u for tolerance at bedside and improved level of alertness which may assist both in efficiency of intake as well as safety with pos.     Aspiration Risk  Moderate aspiration risk    Diet Recommendation   Dysphagia 3, nectar thick  liquids Cup sips, no straws Full supervision  Medication Administration: Whole meds with puree    Other  Recommendations Oral Care Recommendations: Oral care BID Other Recommendations: Order thickener from pharmacy;Prohibited food (jello, ice cream, thin soups);Remove water pitcher   Follow up Recommendations  Skilled Nursing facility    Frequency and Duration min 2x/week  1 week       Swallow Study   General HPI: 81 y.o. female with PMH significant for dementia, HTN, TIA, CVAs  and COPD on oxygen at home, who was recently seen 05/05/15 for right lower leg pain and swelling secondary to hematoma and presented back to [facility] ED with progressively worsening pain since dsicharge. Family reports poor by mouth intake and coughing with pos. CXR with Low lung volumes with mild patchy bilateral lower lobe opacities, Type of Study: Bedside Swallow Evaluation Previous Swallow Assessment: see HPI Diet Prior to this Study: Regular;Thin liquids Temperature Spikes Noted: Yes Respiratory Status: Nasal cannula History of Recent Intubation: No Behavior/Cognition: Lethargic/Drowsy;Cooperative;Pleasant mood;Distractible;Requires cueing (hard of hearing) Oral Cavity Assessment: Within Functional Limits Oral Care Completed by SLP: No Oral Cavity - Dentition: Poor condition;Missing dentition Vision: Functional for self-feeding Self-Feeding Abilities: Able to feed self Patient Positioning: Upright in bed Baseline Vocal Quality: Normal Volitional Cough: Cognitively unable to elicit Volitional Swallow: Unable to elicit    Oral/Motor/Sensory Function Overall Oral Motor/Sensory Function: Within functional limits (informal assessment due to [facility] and cognition)   Ice Chips Ice chips: Impaired Presentation: Spoon Oral Phase Functional Implications: Prolonged oral transit   Thin Liquid Thin Liquid: Impaired Presentation: Cup;Self Fed Pharyngeal  Phase Impairments: Suspected delayed Swallow;Cough - Delayed;Throat Clearing - Immediate    Nectar Thick Nectar Thick Liquid: Within functional limits Presentation: Cup;Self Fed   Honey Thick Honey Thick Liquid: Not tested   Puree Puree: Impaired Presentation: Spoon Oral Phase Impairments: Impaired mastication Oral Phase Functional Implications: Prolonged oral transit   Solid Solid: Impaired Oral Phase Functional Implications: Prolonged oral transit;Impaired mastication;Oral residue      Parker [employer] MA, CCC-SLP [phone]  Holly Allen  Holly Allen 05/19/2015,3:54 PM

## 2015-05-19 NOTE — Progress Notes (Signed)
Patient ID: Holly Allen, female   DOB: 09-17-1914, 81 y.o.   MRN: [id number]  [facility] PROGRESS NOTE  BURNESTINE SEIGFRIED [id number] DOB: 18-Nov-1914 DOA: 05/14/2015 PCP: Woody Seller, MD   Brief narrative:    81 y.o. female with PMH significant for dementia, HTN, TIA, and COPD on oxygen at home, who was recently seen 05/05/15 for right lower leg pain and swelling secondary to hematoma and presented back to [facility] ED with progressively worsening pain since dsicharge. Pain is constant and throbbing, 7/10 in severity, located on anterior shin of the RLE, associated with blistering and surrounding redness, swelling, pain to touch, no specific alleviating factors. Pt's family reports pt is mostly bed bound and needs assistance with all ADL's. Denies chest pain, shortness of breath, N/V, abdominal pain, or fevers. Upon arrival, patient was on NRB due to low oxygen saturation, 85-91% on 10L. She normally requires 3L [place] at home. SBP in 150's, blood work notable for Na 126. TRH asked to admit for further evaluation.   Interval history: 11/18  Poor oral intake per documentation, though family reported she eats well, family reported notice patient has been coughing, left elbow edema, patient requested pain meds for her right lower leg this am.  Assessment/Plan:    Active Problems:  Right lower extremity wound with cellulitis - venous ultrasound no DVT, placed on broad spectrum ABX vanc and Zosyn - appreciate wound care team assistance, ortho team consulted as well, appreciate input  -  blood cultures no growth, mrsa screen negative - wound looks better today on 11/8, less edema, no new blisters, slowing healing blood filled blisters x2, d/c iv vanc/zosyn, change to oral doxycylin  -need outpatient follow up with ortho Dr. Lorin Mercy 11/19 family reported notice more blood oozing, plavix held.   Chronic hypoxic respiratory failure - COPD, keep on oxygen via [place] 3-4 L   Hyponatermia -  slowly improving with IVF -likely from poor oral intake, will try salt tabs, hold ivf due to pleural effusion and bilateral lower extremity edema on cxr. , component of fluids overload? Iv lasix 20mg  iv x1 on 11/16 -11/17 na 129, increase salt tabs, additional dose of lasix 20 iv -11/18, na improving, today at 130, continue salt tabs , lsix 20mg  x1 , Repeat bmp in am -11/9, na 133, will continue salt tabs, stop lasix   Anemia of chronic disease, IDA - drop in Hg since admission, likely dilutional  - transfuse for Hg < 7 - hgb stable , range from 7.2 to 7.8   Essential HTN - continue home medical regimen   Cough?  cxr ? Small pleural effusion? Additional lasix today on 11/18,, swallow eval  Left elbow edema, seems like soft tissue edema, does not seem to have joint involvement on exam, left elbow x ray consistant with soft tissue edema,  family reported h/o shingles on left arm, patient denies pain on left arm or elbow.  H/o bilateral embolic CVA, not a candidate for anticoagulation due to fall risk, h/o dementia, oriented to person, not to place or time. Continue plavix unless bleed.  FTT, very frail elderly demented female, over all poor prognosis. Family reported patient has been bed bound, incontinence  For several month , family reported "patient seems is loosing ground" family is interested in palliative care /home hospice once the cellulitis treated.   DVT prophylaxis - change Lovenox to SCD   Code Status: DNR Family Communication:  Son and two daughters at bedside Disposition Plan: snf  IV access:  Peripheral IV  Procedures and diagnostic studies:    Dg Chest 2 View  05/14/2015  CLINICAL DATA:  Shortness of breath. Nonhealing wound on the lower leg. EXAM: CHEST  2 VIEW COMPARISON:  Chest x-ray dated 05/04/2015 and 03/10/2014 and 04/10/2010 FINDINGS: There is marked tortuosity and calcification of the thoracic aorta and there is chronic prominence of the main pulmonary  arteries consistent with pulmonary arterial hypertension. COPD. Slight atelectasis at both lung bases with a small new left pleural effusion. Diffuse osteopenia with multiple old compression fractures in the spine. IMPRESSION: Slight bibasilar atelectasis with a new small left effusion. Aortic atherosclerosis. Electronically Signed   By: Lorriane Shire M.D.   On: 05/14/2015 14:19   Dg Elbow 2 Views Left  05/18/2015  CLINICAL DATA:  Left elbow pain/ swelling, no known injury EXAM: LEFT ELBOW - 2 VIEW COMPARISON:  None. FINDINGS: Evaluation is constrained by obliquity. No definite fracture is seen. The joint spaces are preserved. Mild soft tissue swelling along the posterior aspect of the distal humerus. IMPRESSION: No fracture or dislocation is seen. Mild soft tissue swelling. Electronically Signed   By: Julian Hy M.D.   On: 05/18/2015 13:50   Dg Tibia/fibula Right  05/14/2015  CLINICAL DATA:  Nonhealing wound mid anterior right tib-fib. EXAM: RIGHT TIBIA AND FIBULA - 2 VIEW COMPARISON:  None. FINDINGS: Significant soft tissue edema. Vascular calcifications are present. No acute fracture or subluxation. No radiopaque foreign body or soft tissue gas. IMPRESSION: Significant soft tissue swelling. Electronically Signed   By: Nolon Nations M.D.   On: 05/14/2015 14:34   Dg Chest Port 1 View  05/18/2015  CLINICAL DATA:  Shortness of breath, cough EXAM: PORTABLE CHEST 1 VIEW COMPARISON:  05/14/2015 FINDINGS: Low lung volumes. Chronic interstitial markings. Mild patchy bilateral lower lobe opacities, likely atelectasis. Suspected small bilateral pleural effusions. No pneumothorax. Heart is normal in size. Vertebral augmentation of multiple thoracic vertebral bodies. IMPRESSION: Low lung volumes with mild patchy bilateral lower lobe opacities, likely atelectasis. Suspected small bilateral pleural effusions. Electronically Signed   By: Julian Hy M.D.   On: 05/18/2015 13:47   Dg Chest Port 1  View  05/05/2015  CLINICAL DATA:  Dyspnea 1 year.  Hypertension. EXAM: PORTABLE CHEST 1 VIEW COMPARISON:  03/10/2014 FINDINGS: There is a small left pleural effusion. There is generalized interstitial coarsening which appears chronic. No confluent airspace consolidation. No pneumothorax. IMPRESSION: Small left pleural effusion. Electronically Signed   By: Andreas Newport M.D.   On: 05/05/2015 00:00    Medical Consultants:  Ortho Wound care  Palliative care Other Consultants:  PT  IAnti-Infectives:   Vancomycin 11/12 -->11/18 Zosyn 11/13 -->11/18 doxycline from 11/8  Marsha Gundlach, MD PhD [facility] Pager [phone]  If 7PM-7AM, please contact night-coverage [email or web address] Password [email or web address] 05/19/2015, 7:35 PM   LOS: 5 days     Intake/Output Summary (Last 24 hours) at 05/19/15 1935 Last data filed at 05/19/15 1909  Gross per 24 hour  Intake    360 ml  Output      0 ml  Net    360 ml    Exam:   General:  Frail, drift to sleep when not disturbed, but follows commands  When asked to  Cardiovascular: ectopic beats, SEM 3/6, no rubs, no gallops  Respiratory: crackles at basis, no wheezing, overall diminished breath sounds  Abdomen: Soft, non tender, non distended, bowel sounds present, no guarding  Extremities: RLE edema/ erythema subsiding, two blood filled blisters slowing  healing, no new blisters, still tender, dressing intact, left ankle/pedal edema has  resolved  Data Reviewed: Basic Metabolic Panel:  Recent Labs Lab 05/15/15 0520 05/16/15 0525 05/17/15 0535 05/18/15 0500 05/19/15 0544  NA 127* 127* 129* 130* 133*  K 4.1 3.9 3.6 3.7 3.6  CL 94* 92* 90* 91* 91*  CO2 30 30 31  33* 36*  GLUCOSE 92 102* 113* 114* 158*  BUN 11 8 8 9 14   CREATININE 0.57 0.63 0.60 0.59 0.73  CALCIUM 8.2* 8.3* 8.4* 8.2* 8.6*  MG  --   --  1.6* 1.9 1.8   CBC:  Recent Labs Lab 05/14/15 1349 05/15/15 0520 05/16/15 0525 05/17/15 0535 05/19/15 0544  WBC 4.0 4.1 3.4* 3.7* 5.1  NEUTROABS  2.1  --   --   --   --   HGB 8.7* 7.4* 7.2* 7.8* 7.3*  HCT 27.2* 23.1* 23.0* 25.0* 23.4*  MCV 72.5* 72.6* 72.8* 73.5* 74.1*  PLT 280 244 216 219 200   Recent Results (from the past 240 hour(s))  Culture, blood (routine x 2)     Status: None   Collection Time: 05/14/15  7:30 PM  Result Value Ref Range Status   Specimen Description BLOOD LEFT ARM  Final   Special Requests BOTTLES DRAWN AEROBIC AND ANAEROBIC  10CC  Final   Culture   Final    NO GROWTH 5 DAYS Performed at [facility]    Report Status 05/19/2015 FINAL  Final  Culture, blood (routine x 2)     Status: None   Collection Time: 05/14/15  7:37 PM  Result Value Ref Range Status   Specimen Description BLOOD RIGHT HAND  Final   Special Requests IN PEDIATRIC BOTTLE  [place]  Final   Culture   Final    NO GROWTH 5 DAYS Performed at [facility]    Report Status 05/19/2015 FINAL  Final  MRSA PCR Screening     Status: None   Collection Time: 05/16/15  1:13 PM  Result Value Ref Range Status   MRSA by PCR NEGATIVE NEGATIVE Final    Comment:        The GeneXpert MRSA Assay (FDA approved for NASAL specimens only), is one component of a comprehensive MRSA colonization surveillance program. It is not intended to diagnose MRSA infection nor to guide or monitor treatment for MRSA infections.      Scheduled Meds: . clopidogrel  75 mg Oral Q breakfast  . doxycycline  100 mg Oral Q12H  . feeding supplement (ENSURE ENLIVE)  237 mL Oral BID BM  . gabapentin  100 mg Oral QHS  . ipratropium-albuterol  3 mL Nebulization BID  . metoprolol  50 mg Oral BID  . sodium chloride  3 mL Intravenous Q12H  . sodium chloride  1 g Oral TID WC  . traMADol  25 mg Oral QHS   Continuous Infusions:

## 2015-05-20 LAB — CBC
HCT: 23.6 % — ABNORMAL LOW (ref 36.0–46.0)
HEMOGLOBIN: 7.2 g/dL — AB (ref 12.0–15.0)
MCH: 22.7 pg — AB (ref 26.0–34.0)
MCHC: 30.5 g/dL (ref 30.0–36.0)
MCV: 74.4 fL — AB (ref 78.0–100.0)
PLATELETS: 217 10*3/uL (ref 150–400)
RBC: 3.17 MIL/uL — AB (ref 3.87–5.11)
RDW: 15.6 % — ABNORMAL HIGH (ref 11.5–15.5)
WBC: 5.3 10*3/uL (ref 4.0–10.5)

## 2015-05-20 LAB — BASIC METABOLIC PANEL
ANION GAP: 8 (ref 5–15)
BUN: 16 mg/dL (ref 6–20)
CHLORIDE: 94 mmol/L — AB (ref 101–111)
CO2: 32 mmol/L (ref 22–32)
Calcium: 8.8 mg/dL — ABNORMAL LOW (ref 8.9–10.3)
Creatinine, Ser: 0.58 mg/dL (ref 0.44–1.00)
GFR calc Af Amer: >60 mL/min (ref >60)
GLUCOSE: 107 mg/dL — AB (ref 65–99)
POTASSIUM: 3.9 mmol/L (ref 3.5–5.1)
SODIUM: 134 mmol/L — AB (ref 135–145)

## 2015-05-20 LAB — MAGNESIUM: MAGNESIUM: 1.9 mg/dL (ref 1.7–2.4)

## 2015-05-20 LAB — PREPARE RBC (CROSSMATCH)

## 2015-05-20 LAB — ABO/RH: ABO/RH(D): O POS

## 2015-05-20 MED ORDER — STARCH (THICKENING) PO POWD
ORAL | Status: DC | PRN
  Filled 2015-05-20: qty 227

## 2015-05-20 MED ORDER — SODIUM CHLORIDE 1 G PO TABS
1.0000 g | ORAL_TABLET | Freq: Two times a day (BID) | ORAL | Status: DC
  Administered 2015-05-20 – 2015-05-21 (×2): 1 g via ORAL
  Filled 2015-05-20 (×4): qty 1

## 2015-05-20 MED ORDER — RESOURCE THICKENUP CLEAR PO POWD
ORAL | Status: DC | PRN
  Filled 2015-05-20: qty 125

## 2015-05-20 MED ORDER — SODIUM CHLORIDE 0.9 % IV SOLN
Freq: Once | INTRAVENOUS | Status: AC
  Administered 2015-05-20: 18:00:00 via INTRAVENOUS

## 2015-05-20 NOTE — Progress Notes (Signed)
Sleep periods were prolonged and sustained overnight, punctuated by brief periods of wakefulness. Patient easily arouses to verbal stimuli, with minimal capacity to follow commands secondary to pre-existing hearing loss. A slight delay or sluggishness in verbal retrieval was evident upon questioning. Patient remains on continuous, supplemental oxygen therapy via nasal cannula, set at 2 liters/minute. Pulse oximetry readings were predominantly above > 92%.  Quantification of motor strength difficult to ascertain secondary to low patient participation and poor muscular effort during testing. Patient is capable of spontaneous limb movements,  of diminished strength and sluggish speed.  There is evidence of pain-inhibited right lower extremity strength, occurring as an adverse pain outcome. Periodic urinary incontinence noted, requiring through perineal cleansing after each episode. A well-demarcated area of nonblanchable erythema overlies the inner gluteal folds, groin and inner thigh creases and perineal area. Patient was turned and repositioned routinely on her lateral sides, with pillow support behind the back and in between the knees. Both calves were elevated longitudinally over a pillow for offloading of heel pressure. Vital signs remained stable.

## 2015-05-20 NOTE — Care Management Important Message (Signed)
Important Message  Patient Details  Name: LATACIA GUYE MRN: [id number] Date of Birth: [birth year]   Medicare Important Message Given:  Yes    Apolonio Schneiders, RN 05/20/2015, 9:18 AM

## 2015-05-20 NOTE — Progress Notes (Signed)
Patient ID: Holly Allen, female   DOB: Nov 20, 1914, 81 y.o.   MRN: [id number]  [facility] PROGRESS NOTE  NASHAYLA DEC [id number] DOB: Aug 26, 1914 DOA: 05/14/2015 PCP: Woody Seller, MD   Brief narrative:    81 y.o. female with PMH significant for dementia, HTN, TIA, and COPD on oxygen at home, who was recently seen 05/05/15 for right lower leg pain and swelling secondary to hematoma and presented back to [facility] ED with progressively worsening pain since dsicharge. Pain is constant and throbbing, 7/10 in severity, located on anterior shin of the RLE, associated with blistering and surrounding redness, swelling, pain to touch, no specific alleviating factors. Pt's family reports pt is mostly bed bound and needs assistance with all ADL's. Denies chest pain, shortness of breath, N/V, abdominal pain, or fevers. Upon arrival, patient was on NRB due to low oxygen saturation, 85-91% on 10L. She normally requires 3L [place] at home. SBP in 150's, blood work notable for Na 126. TRH asked to admit for further evaluation.   Interval history: 11/18  Poor oral intake per documentation, though family reported she eats well, family reported notice patient has been coughing, left elbow edema, patient requested pain meds for her right lower leg this am.  Assessment/Plan:    Active Problems:  Right lower extremity wound with cellulitis - venous ultrasound no DVT, placed on broad spectrum ABX vanc and Zosyn - appreciate wound care team assistance, ortho team consulted as well, appreciate input  -  blood cultures no growth, mrsa screen negative - wound looks better today on 11/8, less edema, no new blisters, slowing healing blood filled blisters x2, d/c iv vanc/zosyn, change to oral doxycylin  -need outpatient follow up with ortho Dr. Lorin Mercy 11/19 family reported notice more blood oozing, plavix held.   Chronic hypoxic respiratory failure - COPD, keep on oxygen via [place] 3-4 L   Hyponatermia -  slowly improving with IVF -likely from poor oral intake, will try salt tabs, hold ivf due to pleural effusion and bilateral lower extremity edema on cxr. , component of fluids overload? Iv lasix 20mg  iv x1 on 11/16 -11/17 na 129, increase salt tabs, additional dose of lasix 20 iv -11/18, na improving, today at 130, continue salt tabs , lsix 20mg  x1 , Repeat bmp in am -11/9, na 133, will continue salt tabs, stop lasix 11/20 na 134, change salt tabs from tid to bid   Anemia of chronic disease, IDA - drop in Hg since admission, likely dilutional  - transfuse 1prbc on 11/20 -    Essential HTN - continue home medical regimen   Cough?  cxr ? Small pleural effusion? Additional lasix today on 11/18,, swallow eval, aspiration precaution  Left elbow edema, seems like soft tissue edema, does not seem to have joint involvement on exam, left elbow x ray consistant with soft tissue edema,  family reported h/o shingles on left arm, patient denies pain on left arm or elbow.  H/o bilateral embolic CVA, not a candidate for anticoagulation due to fall risk, h/o dementia, oriented to person, not to place or time. Continue plavix unless bleed.  FTT, very frail elderly demented female, over all poor prognosis. Family reported patient has been bed bound, incontinence  For several month , family reported "patient seems is loosing ground" family is interested in palliative care /home hospice once the cellulitis treated.   DVT prophylaxis - change Lovenox to SCD   Code Status: DNR Family Communication:  Son at bedside Disposition Plan: snf  on Monday 11/21 if bedavailable  IV access:  Peripheral IV  Procedures and diagnostic studies:    Dg Chest 2 View  05/14/2015  CLINICAL DATA:  Shortness of breath. Nonhealing wound on the lower leg. EXAM: CHEST  2 VIEW COMPARISON:  Chest x-ray dated 05/04/2015 and 03/10/2014 and 04/10/2010 FINDINGS: There is marked tortuosity and calcification of the thoracic aorta and  there is chronic prominence of the main pulmonary arteries consistent with pulmonary arterial hypertension. COPD. Slight atelectasis at both lung bases with a small new left pleural effusion. Diffuse osteopenia with multiple old compression fractures in the spine. IMPRESSION: Slight bibasilar atelectasis with a new small left effusion. Aortic atherosclerosis. Electronically Signed   By: Lorriane Shire M.D.   On: 05/14/2015 14:19   Dg Elbow 2 Views Left  05/18/2015  CLINICAL DATA:  Left elbow pain/ swelling, no known injury EXAM: LEFT ELBOW - 2 VIEW COMPARISON:  None. FINDINGS: Evaluation is constrained by obliquity. No definite fracture is seen. The joint spaces are preserved. Mild soft tissue swelling along the posterior aspect of the distal humerus. IMPRESSION: No fracture or dislocation is seen. Mild soft tissue swelling. Electronically Signed   By: Julian Hy M.D.   On: 05/18/2015 13:50   Dg Tibia/fibula Right  05/14/2015  CLINICAL DATA:  Nonhealing wound mid anterior right tib-fib. EXAM: RIGHT TIBIA AND FIBULA - 2 VIEW COMPARISON:  None. FINDINGS: Significant soft tissue edema. Vascular calcifications are present. No acute fracture or subluxation. No radiopaque foreign body or soft tissue gas. IMPRESSION: Significant soft tissue swelling. Electronically Signed   By: Nolon Nations M.D.   On: 05/14/2015 14:34   Dg Chest Port 1 View  05/18/2015  CLINICAL DATA:  Shortness of breath, cough EXAM: PORTABLE CHEST 1 VIEW COMPARISON:  05/14/2015 FINDINGS: Low lung volumes. Chronic interstitial markings. Mild patchy bilateral lower lobe opacities, likely atelectasis. Suspected small bilateral pleural effusions. No pneumothorax. Heart is normal in size. Vertebral augmentation of multiple thoracic vertebral bodies. IMPRESSION: Low lung volumes with mild patchy bilateral lower lobe opacities, likely atelectasis. Suspected small bilateral pleural effusions. Electronically Signed   By: Julian Hy  M.D.   On: 05/18/2015 13:47   Dg Chest Port 1 View  05/05/2015  CLINICAL DATA:  Dyspnea 1 year.  Hypertension. EXAM: PORTABLE CHEST 1 VIEW COMPARISON:  03/10/2014 FINDINGS: There is a small left pleural effusion. There is generalized interstitial coarsening which appears chronic. No confluent airspace consolidation. No pneumothorax. IMPRESSION: Small left pleural effusion. Electronically Signed   By: Andreas Newport M.D.   On: 05/05/2015 00:00    Medical Consultants:  Ortho Wound care  Palliative care Other Consultants:  PT  IAnti-Infectives:   Vancomycin 11/12 -->11/18 Zosyn 11/13 -->11/18 doxycline from 11/8  Fitz Matsuo, MD PhD [facility] Pager [phone]  If 7PM-7AM, please contact night-coverage [email or web address] Password [email or web address] 05/20/2015, 12:45 PM   LOS: 6 days     Intake/Output Summary (Last 24 hours) at 05/20/15 1245 Last data filed at 05/19/15 2135  Gross per 24 hour  Intake    300 ml  Output      0 ml  Net    300 ml    Exam:   General:  Frail, drift to sleep when not disturbed, but follows commands  When asked to  Cardiovascular: ectopic beats, SEM 3/6, no rubs, no gallops  Respiratory: crackles at basis, no wheezing, overall diminished breath sounds  Abdomen: Soft, non tender, non distended, bowel sounds present, no guarding  Extremities: RLE edema/ erythema  subsiding, two blood filled blisters slowing healing, no new blisters, no fresh blood, still tender, dressing intact, left ankle/pedal edema has  resolved  Data Reviewed: Basic Metabolic Panel:  Recent Labs Lab 05/16/15 0525 05/17/15 0535 05/18/15 0500 05/19/15 0544 05/20/15 0528  NA 127* 129* 130* 133* 134*  K 3.9 3.6 3.7 3.6 3.9  CL 92* 90* 91* 91* 94*  CO2 30 31 33* 36* 32  GLUCOSE 102* 113* 114* 158* 107*  BUN 8 8 9 14 16   CREATININE 0.63 0.60 0.59 0.73 0.58  CALCIUM 8.3* 8.4* 8.2* 8.6* 8.8*  MG  --  1.6* 1.9 1.8 1.9   CBC:  Recent Labs Lab 05/14/15 1349 05/15/15 0520 05/16/15 0525  05/17/15 0535 05/19/15 0544 05/20/15 0500  WBC 4.0 4.1 3.4* 3.7* 5.1 5.3  NEUTROABS 2.1  --   --   --   --   --   HGB 8.7* 7.4* 7.2* 7.8* 7.3* 7.2*  HCT 27.2* 23.1* 23.0* 25.0* 23.4* 23.6*  MCV 72.5* 72.6* 72.8* 73.5* 74.1* 74.4*  PLT 280 244 216 219 200 217   Recent Results (from the past 240 hour(s))  Culture, blood (routine x 2)     Status: None   Collection Time: 05/14/15  7:30 PM  Result Value Ref Range Status   Specimen Description BLOOD LEFT ARM  Final   Special Requests BOTTLES DRAWN AEROBIC AND ANAEROBIC  10CC  Final   Culture   Final    NO GROWTH 5 DAYS Performed at [facility]    Report Status 05/19/2015 FINAL  Final  Culture, blood (routine x 2)     Status: None   Collection Time: 05/14/15  7:37 PM  Result Value Ref Range Status   Specimen Description BLOOD RIGHT HAND  Final   Special Requests IN PEDIATRIC BOTTLE  [place]  Final   Culture   Final    NO GROWTH 5 DAYS Performed at [facility]    Report Status 05/19/2015 FINAL  Final  MRSA PCR Screening     Status: None   Collection Time: 05/16/15  1:13 PM  Result Value Ref Range Status   MRSA by PCR NEGATIVE NEGATIVE Final    Comment:        The GeneXpert MRSA Assay (FDA approved for NASAL specimens only), is one component of a comprehensive MRSA colonization surveillance program. It is not intended to diagnose MRSA infection nor to guide or monitor treatment for MRSA infections.      Scheduled Meds: . sodium chloride   Intravenous Once  . doxycycline  100 mg Oral Q12H  . feeding supplement (ENSURE ENLIVE)  237 mL Oral BID BM  . gabapentin  100 mg Oral QHS  . ipratropium-albuterol  3 mL Nebulization BID  . metoprolol  50 mg Oral BID  . sodium chloride  3 mL Intravenous Q12H  . sodium chloride  1 g Oral TID WC  . traMADol  25 mg Oral QHS   Continuous Infusions:

## 2015-05-21 LAB — BASIC METABOLIC PANEL
Anion gap: 7 (ref 5–15)
BUN: 19 mg/dL (ref 6–20)
CALCIUM: 9 mg/dL (ref 8.9–10.3)
CO2: 32 mmol/L (ref 22–32)
CREATININE: 0.54 mg/dL (ref 0.44–1.00)
Chloride: 97 mmol/L — ABNORMAL LOW (ref 101–111)
GFR calc Af Amer: >60 mL/min (ref >60)
Glucose, Bld: 122 mg/dL — ABNORMAL HIGH (ref 65–99)
POTASSIUM: 3.9 mmol/L (ref 3.5–5.1)
SODIUM: 136 mmol/L (ref 135–145)

## 2015-05-21 LAB — TYPE AND SCREEN
ABO/RH(D): O POS
ANTIBODY SCREEN: NEGATIVE
UNIT DIVISION: 0

## 2015-05-21 LAB — MAGNESIUM: MAGNESIUM: 1.9 mg/dL (ref 1.7–2.4)

## 2015-05-21 LAB — CBC
HEMATOCRIT: 27.6 % — AB (ref 36.0–46.0)
Hemoglobin: 8.6 g/dL — ABNORMAL LOW (ref 12.0–15.0)
MCH: 23.9 pg — AB (ref 26.0–34.0)
MCHC: 31.2 g/dL (ref 30.0–36.0)
MCV: 76.7 fL — AB (ref 78.0–100.0)
PLATELETS: 212 10*3/uL (ref 150–400)
RBC: 3.6 MIL/uL — AB (ref 3.87–5.11)
RDW: 16 % — ABNORMAL HIGH (ref 11.5–15.5)
WBC: 5 10*3/uL (ref 4.0–10.5)

## 2015-05-21 MED ORDER — SODIUM CHLORIDE 1 G PO TABS: 1.0000 g | ORAL_TABLET | Freq: Every day | ORAL | Status: AC

## 2015-05-21 MED ORDER — DOXYCYCLINE HYCLATE 100 MG PO TABS: 100.0000 mg | ORAL_TABLET | Freq: Two times a day (BID) | ORAL | Status: AC

## 2015-05-21 MED ORDER — HYDROCODONE-ACETAMINOPHEN 5-325 MG PO TABS: 0.5000 | ORAL_TABLET | Freq: Four times a day (QID) | ORAL | Status: AC | PRN

## 2015-05-21 MED ORDER — FUROSEMIDE 20 MG PO TABS: 10.0000 mg | ORAL_TABLET | ORAL | Status: AC

## 2015-05-21 MED ORDER — RESOURCE THICKENUP CLEAR PO POWD: 1.0000 | ORAL | Status: AC | PRN

## 2015-05-21 NOTE — Progress Notes (Signed)
Speech Language Pathology Treatment: Dysphagia  Patient Details Name: Holly Allen MRN: [id number] DOB: Jun 04, 1915 Today's Date: 05/21/2015 Time: 1050-1130 SLP Time Calculation (min) (ACUTE ONLY): 40 min  Assessment / Plan / Recommendation Clinical Impression  Pt seen to assess po tolerance, educate pt/family to aspiration mitigation strategies.  Sammy (son) arrived during SLP session and reported pt has h/o frequent belching and throat clearing with intake- indicative of chronic dysphagia.  He also advised pt with poor intake of liquids prior to admit and he is concerned re: her being adequately hydrated with limited diet.  He states pt was sleeping frequently even prior to admit and lethargy was recently exacerbated by pain pills.    Pt woke adequately to hold her own cup and accept small amount of liquids.  Observed pt consuming nectar thick Sprite via tsp & cup and thin water via tsp, cup & straw.   Swallow was audible and followed by belching with delayed coughing across both consistencies- ongoing concern for aspiration present.  Nectar liquids may mitigate risk but likely will NOT prevent aspiration in this SLPs opinion.    SLP provided information re: [employer] [employer] Free Water protocol to son and explained clinical reasoning for it- to allow thin water between meals for QOL/hydration issues.    Completing MBS as an option reviewed with son; SLP suspect an MBS will not change this pt's outcome and Sammy agreed to defer at this time.    Daughter arrived to room late in session stating pt asked "Where's my bread?" regarding dinner last night.  Advised family to possible option of dys1/thin for "institutionalized feeding" for maximal safety due to their aspiration concerns and family bringing pt some of her favorite foods she can tolerate when they see her.  They reported understanding to information.    SlP encouraged pt's daughter to speak to Sammy re: information we have reviewed earlier in  session.   Provided information re: comfort feeding - at this time recommend consider dys1/extra gravy/sauce/thin for pt's comfort/pleasure per Sammy's reported wishes.  Education ongoing with this family/pt.     HPI HPI: h/o COPD, dementia, TIA, CVA, HTN, admit with cellitis.        SLP Plan  Continue with current plan of care     Recommendations  Diet recommendations: Dysphagia 1 (puree);Thin liquid;Nectar-thick liquid (provided information re: Copywriter, advertising Protocol to pt/family) Medication Administration: Whole meds with puree (crush if large and not contraindicated) Supervision: Patient able to self feed;Full supervision/cueing for compensatory strategies Compensations: Slow rate;Small sips/bites;Other (Comment) (rest if pt coughing or short of breath) Postural Changes and/or Swallow Maneuvers: Seated upright 90 degrees;Upright 30-60 min after meal              Oral Care Recommendations: Oral care QID Follow up Recommendations: Skilled Nursing facility Plan: Continue with current plan of care   Luanna Salk, [place] [facility] [place] SLP [phone]

## 2015-05-21 NOTE — Discharge Summary (Addendum)
Discharge Summary  Holly Allen [id number] DOB: 07/06/1914  PCP: Woody Seller, MD  Admit date: 05/14/2015 Discharge date: 05/21/2015  Time spent: >49mins  Recommendations for Outpatient Follow-up:  1. F/u with PMD at SNF , repeat cbc/bmp in one week. Hold plavix and transfuse prbc if hgb less than 7. 2. F/u with orthopedics Dr. Lorin Mercy for right lower extremity wound 3. F/u with wound care clinic  4. Continue palliative care at SNF.  Discharge Diagnoses:  Active Hospital Problems   Diagnosis Date Noted  . Malnutrition of moderate degree 05/16/2015  . Palliative care encounter 05/16/2015  . Dementia 05/16/2015  . Cellulitis 05/14/2015  . Cellulitis of right lower extremity   . Non-healing wound of lower extremity   . Protein-calorie malnutrition, severe ([place]) 12/07/2013  . Hyponatremia 02/02/2013  . Iron deficiency anemia 03/21/2012  . COPD (chronic obstructive pulmonary disease) ([place]) 03/19/2012    Resolved Hospital Problems   Diagnosis Date Noted Date Resolved  No resolved problems to display.    Discharge Condition: stable  Diet recommendation: puree diet/nectar thick liquids/ crush pills, aspiration precaution  Filed Weights   05/16/15 2054 05/20/15 0627 05/21/15 0500  Weight: 122 lb 12.7 oz (55.7 kg) 119 lb 9.6 oz (54.25 kg) 111 lb 8.8 oz (50.6 kg)    History of present illness:  81 y.o. female with PMH significant for dementia, HTN, TIA, and COPD on oxygen at home, who was recently seen 05/05/15 for right lower leg pain and swelling secondary to hematoma and presented back to [facility] ED with progressively worsening pain since dsicharge. Pain is constant and throbbing, 7/10 in severity, located on anterior shin of the RLE, associated with blistering and surrounding redness, swelling, pain to touch, no specific alleviating factors. Pt's family reports pt is mostly bed bound and needs assistance with all ADL's. Denies chest pain, shortness of breath, N/V, abdominal  pain, or fevers. Upon arrival, patient was on NRB due to low oxygen saturation, 85-91% on 10L. She normally requires 3L [place] at home. SBP in 150's, blood work notable for Na 126. TRH asked to admit for further evaluation.   Hospital Course:  Active Problems:   COPD (chronic obstructive pulmonary disease) (HCC)   Iron deficiency anemia   Hyponatremia   Protein-calorie malnutrition, severe (HCC)   Cellulitis   Cellulitis of right lower extremity   Non-healing wound of lower extremity   Malnutrition of moderate degree   Palliative care encounter   Dementia  Right lower extremity wound with cellulitis - venous ultrasound no DVT, placed on broad spectrum ABX vanc and Zosyn initially - appreciate wound care team assistance, ortho team consulted as well, appreciate input  - blood cultures no growth, mrsa screen negative - wound looks better today on 11/8, less edema, no new blisters, slowing healing blood filled blisters x2, d/c iv vanc/zosyn, change to oral doxycylin  -need outpatient follow up with ortho Dr. Lorin Mercy 11/19 family reported notice more blood oozing, plavix held.  plavix resumed at discharge, may need to be held if bleeding from wound.   acute on Chronic hypoxic respiratory failure, required high flow oxygen on admission, better now at baseline -initial high oxygen requirement likely from volume overload, treated with lasix - COPD, keep on oxygen via [place] 3-4 L   Hyponatermia - -likely multifactorial from poor oral intake,volume overload, with pleural effusion on cxr and bilateral lower extremity edema on exam initially Received Iv lasix 20mg  iv x1 on 11/16, 11/17 and 11/18 -received salt tabs -na normalized  at discharge. She is discharged on lasix every other day and salt tab qd. pmd to repeat bmp.   Anemia of chronic disease, IDA - drop in Hg since admission, likely dilutional  - transfuse 1prbc on 11/20 - hgb appropriately increase post transfusion. pmd to monitor  cbc, transfuse prn if hgb less than 7.   Essential HTN - continue home medical regimen   Cough? cxr ? Small pleural effusion?  S/p lasix Diet changed per swallow eval, aspiration precaution Cough resolved at discharge  Left elbow edema, seems like soft tissue edema, does not seem to have joint involvement on exam, left elbow x ray consistant with soft tissue edema,  family reported h/o shingles on left arm, patient denies pain on left arm or elbow. better  H/o bilateral embolic CVA, not a candidate for anticoagulation due to fall risk, h/o dementia, oriented to person, not to place or time. Continue plavix unless bleed.  FTT, very frail elderly demented female, over all poor prognosis. Family reported patient has been bed bound, incontinence For several month , family reported "patient seems is loosing ground" family is interested in palliative care /home hospice once the cellulitis treated.   DVT prophylaxis - change Lovenox to SCD   Code Status: DNR Family Communication: Son and daughter at bedside Disposition Plan: snf on Monday 11/21   Discharge Exam: BP 154/59 mmHg  Pulse 91  Temp(Src) 98.2 F (36.8 C) (Oral)  Resp 19  Ht 5\' 4"  (1.626 m)  Wt 111 lb 8.8 oz (50.6 kg)  BMI 19.14 kg/m2  SpO2 98%   General: Frail, drift to sleep when not disturbed, but follows commands When asked to  Cardiovascular: ectopic beats, SEM 3/6, no rubs, no gallops  Respiratory: crackles at basis, no wheezing, overall diminished breath sounds  Abdomen: Soft, non tender, non distended, bowel sounds present, no guarding  Extremities: RLE edema/ erythema subsiding, two blood filled blisters slowing healing, no new blisters, no fresh blood, still tender, dressing intact, left ankle/pedal edema has resolved   Discharge Instructions You were cared for by a hospitalist during your hospital stay. If you have any questions about your discharge medications or the care you received while you  were in the hospital after you are discharged, you can call the unit and asked to speak with the hospitalist on call if the hospitalist that took care of you is not available. Once you are discharged, your primary care physician will handle any further medical issues. Please note that NO REFILLS for any discharge medications will be authorized once you are discharged, as it is imperative that you return to your primary care physician (or establish a relationship with a primary care physician if you do not have one) for your aftercare needs so that they can reassess your need for medications and monitor your lab values.  Discharge Instructions    Diet - low sodium heart healthy    Complete by:  As directed   Pureed diet, nectar thick liquid, aspiration precaution     Increase activity slowly    Complete by:  As directed             Medication List    STOP taking these medications        cephALEXin 500 MG capsule  Commonly known as:  KEFLEX     levofloxacin 500 MG tablet  Commonly known as:  LEVAQUIN     predniSONE 20 MG tablet  Commonly known as:  DELTASONE  TAKE these medications        cholecalciferol 1000 UNITS tablet  Commonly known as:  VITAMIN D  Take 1,000 Units by mouth daily.     clopidogrel 75 MG tablet  Commonly known as:  PLAVIX  Take 75 mg by mouth daily with breakfast.     doxycycline 100 MG tablet  Commonly known as:  VIBRA-TABS  Take 1 tablet (100 mg total) by mouth every 12 (twelve) hours.     furosemide 20 MG tablet  Commonly known as:  LASIX  Take 0.5 tablets (10 mg total) by mouth every other day.     gabapentin 100 MG capsule  Commonly known as:  NEURONTIN  Take 100 mg by mouth at bedtime.     HYDROcodone-acetaminophen 5-325 MG tablet  Commonly known as:  NORCO/VICODIN  Take 0.5 tablets by mouth every 6 (six) hours as needed (pain).     ipratropium-albuterol 0.5-2.5 (3) MG/3ML Soln  Commonly known as:  DUONEB  Take 3 mLs by nebulization 2  (two) times daily.     metoprolol 50 MG tablet  Commonly known as:  LOPRESSOR  Take 50 mg by mouth 2 (two) times daily.     RESOURCE THICKENUP CLEAR Powd  Take 120 g by mouth as needed (for thickening fluids).     sodium chloride 1 G tablet  Take 1 tablet (1 g total) by mouth daily.     traMADol 50 MG tablet  Commonly known as:  ULTRAM  Take 25 mg by mouth at bedtime.       Allergies  Allergen Reactions  . Codeine     vomits  . Other Other (See Comments)    Muscle relaxant may have caused her stroke per family       Follow-up Information    Follow up with Marybelle Killings, MD In 2 weeks.   Specialty:  Orthopedic Surgery   Contact information:   [place] [address] [phone]       Follow up with [facility]              In 2 weeks.   Why:  right lower extremity wound   Contact information:   [address] [phone]      Follow up with [facility] .   Specialty:  [place] information:   [id number] [place] [phone]       The results of significant diagnostics from this hospitalization (including imaging, microbiology, ancillary and laboratory) are listed below for reference.    Significant Diagnostic Studies: Dg Chest 2 View  05/14/2015  CLINICAL DATA:  Shortness of breath. Nonhealing wound on the lower leg. EXAM: CHEST  2 VIEW COMPARISON:  Chest x-ray dated 05/04/2015 and 03/10/2014 and 04/10/2010 FINDINGS: There is marked tortuosity and calcification of the thoracic aorta and there is chronic prominence of the main pulmonary arteries consistent with pulmonary arterial hypertension. COPD. Slight atelectasis at both lung bases with a small new left pleural effusion. Diffuse osteopenia with multiple old compression fractures in the spine. IMPRESSION: Slight bibasilar  atelectasis with a new small left effusion. Aortic atherosclerosis. Electronically Signed   By: Lorriane Shire M.D.   On: 05/14/2015 14:19   Dg Elbow 2 Views Left  05/18/2015  CLINICAL DATA:  Left elbow pain/ swelling, no known injury EXAM: LEFT ELBOW - 2 VIEW COMPARISON:  None. FINDINGS: Evaluation is constrained by obliquity. No definite fracture is seen. The joint spaces are preserved. Mild soft tissue swelling along the posterior aspect of the distal humerus. IMPRESSION: No fracture or dislocation is seen. Mild soft tissue swelling. Electronically Signed   By: Julian Hy M.D.   On: 05/18/2015 13:50   Dg Tibia/fibula Right  05/14/2015  CLINICAL DATA:  Nonhealing wound mid anterior right tib-fib. EXAM: RIGHT TIBIA AND FIBULA - 2 VIEW COMPARISON:  None. FINDINGS: Significant soft tissue edema. Vascular calcifications are present. No acute fracture or subluxation. No radiopaque foreign body or soft tissue gas. IMPRESSION: Significant soft tissue swelling. Electronically Signed   By: Nolon Nations M.D.   On: 05/14/2015 14:34   Dg Chest Port 1 View  05/18/2015  CLINICAL DATA:  Shortness of breath, cough EXAM: PORTABLE CHEST 1 VIEW COMPARISON:  05/14/2015 FINDINGS: Low lung volumes. Chronic interstitial markings. Mild patchy bilateral lower lobe opacities, likely atelectasis. Suspected small bilateral pleural effusions. No pneumothorax. Heart is normal in size. Vertebral augmentation of multiple thoracic vertebral bodies. IMPRESSION: Low lung volumes with mild patchy bilateral lower lobe opacities, likely atelectasis. Suspected small bilateral pleural effusions. Electronically Signed   By: Julian Hy M.D.   On: 05/18/2015 13:47   Dg Chest Port 1 View  05/05/2015  CLINICAL DATA:  Dyspnea 1 year.  Hypertension. EXAM: PORTABLE CHEST 1 VIEW COMPARISON:  03/10/2014 FINDINGS: There is a small left pleural effusion. There is generalized interstitial coarsening which appears chronic. No  confluent airspace consolidation. No pneumothorax. IMPRESSION: Small left pleural effusion. Electronically Signed   By: Andreas Newport M.D.   On: 05/05/2015 00:00    Microbiology: Recent Results (from the past 240 hour(s))  Culture, blood (routine x 2)     Status: None   Collection Time: 05/14/15  7:30 PM  Result Value Ref Range Status   Specimen Description BLOOD LEFT ARM  Final   Special Requests BOTTLES DRAWN AEROBIC AND ANAEROBIC  10CC  Final   Culture   Final    NO GROWTH 5 DAYS Performed at [facility]    Report Status 05/19/2015 FINAL  Final  Culture, blood (routine x 2)     Status: None   Collection Time: 05/14/15  7:37 PM  Result Value Ref Range Status   Specimen Description BLOOD RIGHT HAND  Final   Special Requests IN PEDIATRIC BOTTLE  [place]  Final   Culture   Final    NO GROWTH 5 DAYS Performed at [facility]    Report Status 05/19/2015 FINAL  Final  MRSA PCR Screening     Status: None   Collection Time: 05/16/15  1:13 PM  Result Value Ref Range Status   MRSA by PCR NEGATIVE NEGATIVE Final    Comment:        The GeneXpert MRSA Assay (FDA approved for NASAL specimens only), is one component of a comprehensive MRSA colonization surveillance program. It is not intended to diagnose MRSA infection nor to guide or monitor treatment for MRSA infections.      Labs: Basic Metabolic Panel:  Recent Labs Lab 05/17/15 0535 05/18/15 0500 05/19/15 0544 05/20/15 0528 05/21/15 0545  NA 129* 130* 133* 134* 136  K 3.6 3.7 3.6 3.9 3.9  CL 90* 91* 91* 94* 97*  CO2 31 33* 36* 32 32  GLUCOSE 113* 114* 158* 107* 122*  BUN 8 9 14 16 19   CREATININE 0.60 0.59 0.73 0.58 0.54  CALCIUM 8.4* 8.2* 8.6* 8.8* 9.0  MG 1.6* 1.9 1.8 1.9 1.9   Liver Function Tests:  Recent Labs Lab 05/17/15 0535  AST 13*  ALT 8*  ALKPHOS 75  BILITOT 0.6  PROT 5.0*  ALBUMIN 2.6*   No results for input(s): LIPASE, AMYLASE in the last 168 hours. No results for input(s):  AMMONIA in the last 168 hours. CBC:  Recent Labs Lab 05/14/15 1349  05/16/15 0525 05/17/15 0535 05/19/15 0544 05/20/15 0500 05/21/15 0545  WBC 4.0  < > 3.4* 3.7* 5.1 5.3 5.0  NEUTROABS 2.1  --   --   --   --   --   --   HGB 8.7*  < > 7.2* 7.8* 7.3* 7.2* 8.6*  HCT 27.2*  < > 23.0* 25.0* 23.4* 23.6* 27.6*  MCV 72.5*  < > 72.8* 73.5* 74.1* 74.4* 76.7*  PLT 280  < > 216 219 200 217 212  < > = values in this interval not displayed. Cardiac Enzymes: No results for input(s): CKTOTAL, CKMB, CKMBINDEX, TROPONINI in the last 168 hours. BNP: BNP (last 3 results)  Recent Labs  05/04/15 2346 05/14/15 1349  BNP 186.7* 198.8*    ProBNP (last 3 results) No results for input(s): PROBNP in the last 8760 hours.  CBG: No results for input(s): GLUCAP in the last 168 hours.     SignedFlorencia Reasons MD, PhD  [facility] 05/21/2015, 12:05 PM

## 2015-05-21 NOTE — NC FL2 (Deleted)
[place] LEVEL OF CARE SCREENING TOOL     IDENTIFICATION  Patient Name: Holly Allen Birthdate: [birth year] Sex: female Admission Date (Current Location): 05/14/2015  [facility] and [place] Number: [place]  (Pt does not have Medicaid) Facility and Address:  [facility],  [place] [address], [place]      Provider Number: [id number]  Attending Physician Name and Address:  Florencia Reasons, MD  Relative Name and Phone Number:       Current Level of Care: Hospital Recommended Level of Care: [place] Prior Approval Number:    Date Approved/Denied:   PASRR Number: [id number]  Discharge Plan: SNF    Current Diagnoses: Patient Active Problem List   Diagnosis Date Noted  . Malnutrition of moderate degree 05/16/2015  . Palliative care encounter 05/16/2015  . Dementia 05/16/2015  . Cellulitis 05/14/2015  . Cellulitis of right lower extremity   . Non-healing wound of lower extremity   . Protein-calorie malnutrition, severe ([place]) 12/07/2013  . COPD with acute exacerbation ([place]) 12/06/2013  . UTI (urinary tract infection) 02/02/2013  . Hyponatremia 02/02/2013  . Iron deficiency anemia 03/21/2012  . COPD (chronic obstructive pulmonary disease) ([place]) 03/19/2012  . Fracture of femoral neck, right ([place]) 03/17/2012  . Leukocytosis 03/17/2012    Orientation ACTIVITIES/SOCIAL BLADDER RESPIRATION    Place, Self  Family supportive Incontinent  (2 L/min continuous)  BEHAVIORAL SYMPTOMS/MOOD NEUROLOGICAL BOWEL NUTRITION STATUS      Incontinent Diet (Diet regular Room service appropriate; Fluid consistency: Thin )  PHYSICIAN VISITS COMMUNICATION OF NEEDS Height & Weight Skin    Verbally 5\' 4"  (162.6 cm) 122 lbs. Skin abrasions, Other (Comment) (Right lower extremity wound with cellulitis treatment Cover hematoma with xeroform gauze, secure with kerlix. Change every other day.)          AMBULATORY STATUS RESPIRATION    Assist extensive   (2 L/min continuous)      Personal Care Assistance Level of Assistance  Dressing, Feeding, Bathing Bathing Assistance: Maximum assistance Feeding assistance: Maximum assistance Dressing Assistance: Maximum assistance      Functional Limitations Info  Sight, Speech Sight Info: Adequate Hearing Info: Impaired (Hard of hearing ) Speech Info: Adequate       SPECIAL CARE FACTORS FREQUENCY  PT (By licensed PT), OT (By licensed OT)     PT Frequency: 5x week OT Frequency: 5x week           Additional Factors Info  Code Status, Allergies Code Status Info: DNR Allergies Info: Codeine (Codeine, Other: Muscle relaxant may have caused her stroke per family)           Current Medications (05/21/2015): Current Facility-Administered Medications  Medication Dose Route Frequency Provider Last Rate Last Dose  . doxycycline (VIBRA-TABS) tablet 100 mg  100 mg Oral Q12H Florencia Reasons, MD   100 mg at 05/20/15 2154  . feeding supplement (ENSURE ENLIVE) (ENSURE ENLIVE) liquid 237 mL  237 mL Oral BID BM Maricela Bo [place], RD   237 mL at 05/20/15 1750  . gabapentin (NEURONTIN) capsule 100 mg  100 mg Oral QHS Theodis Blaze, MD   100 mg at 05/20/15 2155  . HYDROcodone-acetaminophen (NORCO/VICODIN) 5-325 MG per tablet 0.5 tablet  0.5 tablet Oral BID PRN Theodis Blaze, MD   0.5 tablet at 05/20/15 1951  . ipratropium-albuterol (DUONEB) 0.5-2.5 (3) MG/3ML nebulizer solution 3 mL  3 mL Nebulization BID Theodis Blaze, MD   3 mL at 05/21/15 0813  . metoprolol (  LOPRESSOR) tablet 50 mg  50 mg Oral BID Theodis Blaze, MD   50 mg at 05/20/15 2155  . ondansetron (ZOFRAN) tablet 4 mg  4 mg Oral Q6H PRN Theodis Blaze, MD       Or  . ondansetron [facility]) injection 4 mg  4 mg Intravenous Q6H PRN Theodis Blaze, MD      . RESOURCE THICKENUP CLEAR   Oral PRN Florencia Reasons, MD      . sodium chloride 0.9 % injection 3 mL  3 mL Intravenous Q12H Theodis Blaze, MD   3 mL at 05/20/15 2155  . sodium chloride tablet 1 g  1 g Oral BID  WC Florencia Reasons, MD   1 g at 05/20/15 1749  . traMADol (ULTRAM) tablet 25 mg  25 mg Oral QHS Theodis Blaze, MD   25 mg at 05/20/15 2154   Do not use this list as official medication orders. Please verify with discharge summary.  Discharge Medications:   Medication List    ASK your doctor about these medications        cephALEXin 500 MG capsule  Commonly known as:  KEFLEX  Take 1 capsule (500 mg total) by mouth 3 (three) times daily.     cholecalciferol 1000 UNITS tablet  Commonly known as:  VITAMIN D  Take 1,000 Units by mouth daily.     clopidogrel 75 MG tablet  Commonly known as:  PLAVIX  Take 75 mg by mouth daily with breakfast.     furosemide 20 MG tablet  Commonly known as:  LASIX  Take 0.5 tablets (10 mg total) by mouth daily.     gabapentin 100 MG capsule  Commonly known as:  NEURONTIN  Take 100 mg by mouth at bedtime.     HYDROcodone-acetaminophen 5-325 MG tablet  Commonly known as:  NORCO/VICODIN  Take 1 tablet by mouth every 4 (four) hours as needed.     HYDROcodone-acetaminophen 5-325 MG tablet  Commonly known as:  NORCO/VICODIN  Take 0.5 tablets by mouth 2 (two) times daily as needed (pain).     ipratropium-albuterol 0.5-2.5 (3) MG/3ML Soln  Commonly known as:  DUONEB  Take 3 mLs by nebulization 2 (two) times daily.     levofloxacin 500 MG tablet  Commonly known as:  LEVAQUIN  Take 1 tablet (500 mg total) by mouth every other day.     metoprolol 50 MG tablet  Commonly known as:  LOPRESSOR  Take 50 mg by mouth 2 (two) times daily.     predniSONE 20 MG tablet  Commonly known as:  DELTASONE  Take 2 tablets (40 mg total) by mouth daily with breakfast.     traMADol 50 MG tablet  Commonly known as:  ULTRAM  Take 25 mg by mouth at bedtime.        Relevant Imaging Results:  Relevant Lab Results:  Recent Labs    Additional Information SSN: [id number]. Patient to have Palliative Care Services follow at SNF.  Jalayiah Bibian, [place], LCSWA

## 2015-05-21 NOTE — Clinical Social Work Note (Addendum)
CSW contacted [facility] to find out if a bed is still available, [facility] stated they do not have a female bed available.  CSW notified patient's son Holly Allen who said to contact his sister, Holly Allen to inform them they have to look at different SNF.  CSW presented bed offers, and patient's sister agreed to [place], [place] contacted [facility] to confirm there is a bed available.  [facility] said yes they can take patient once she is medically ready for discharge and orders have been received.  CSW to continue to follow patient's progress.  Holly Allen. [place], MSW, Media 05/21/2015 10:31 AM

## 2015-05-21 NOTE — Progress Notes (Signed)
Nutrition Follow-up  DOCUMENTATION CODES:   Non-severe (moderate) malnutrition in context of chronic illness  INTERVENTION:  - Will d/c Ensure Enlive due to change in liquid consistency order - Will order Magic Cup BID if pt does not d/c today - RD will continue to monitor for needs  NUTRITION DIAGNOSIS:   Increased nutrient needs related to wound healing as evidenced by estimated needs. -ongoing  GOAL:   Patient will meet greater than or equal to 90% of their needs -variably met  MONITOR:   PO intake, Supplement acceptance, Weight trends, Labs, Skin, I & O's  REASON FOR ASSESSMENT:   Consult Assessment of nutrition requirement/status  ASSESSMENT:   81 y.o. female with PMH significant for dementia, HTN, TIA, and COPD on oxygen at home, who was recently seen 05/05/15 for right lower leg pain and swelling secondary to hematoma and presented back to [facility] ED with progressively worsening pain since dsicharge. Pain is constant and throbbing, 7/10 in severity, located on anterior shin of the RLE, associated with blistering and surrounding redness, swelling, pain to touch, no specific alleviating factors. Pt's family reports pt is mostly bed bound and needs assistance with all ADL's. Denies chest pain, shortness of breath, N/V, abdominal pain, or fevers. Upon arrival, patient was on NRB due to low oxygen saturation, 85-91% on 10L. She normally requires 3L [place] at home. SBP in 150's, blood work notable for Na 126. TRH asked to admit for further evaluation.   11/21 Per chart review, diet changes as follows: 11/14 @ 1723: Regular with thin liquids 11/19 @ 1555: Dysphagia 3 with nectar thick liquids 11/20 @ 1238: Dysphagia 1 with nectar thick liquids  Per chart review, pt consumed 50% of breakfast and lunch and 75% of dinner 11/17, 75% of breakfast 11/18, and 50% of breakfast, 80% of lunch, and 25% of dinner 11/19.  Pt with hx of dementia and unable to provide information. Per MD note  11/20, pt is likely to d/c to SNF today as long as a bed is available.  Variably meeting needs. Medications reviewed. Labs reviewed; Cl: 97 mmol/L.   11/16 - Per chart review, pt has gained 20 lbs in the past 14 months.  - Pt with hx of dementia. She was unable to provide any relevant information at this time and no family/visitors at bedside.  - Per chart review, pt refused breakfast and ate 75% of lunch and 25% of dinner yesterday (11/15).  - She states she had breakfast this AM but was unable to give any further details.  - Pt on Regular diet but noted several missing teeth; question if pt needs softer foods related to this.  - Physical assessment showed mild fat and moderate and severe muscle wasting to upper body. Moderate edema to BLE with cellulitis present.  Diet Order:  DIET - DYS 1 Room service appropriate?: Yes; Fluid consistency:: Nectar Thick  Skin:  Wound (see comment) (R leg wound, Head laceration from fall 12/05/2013)  Last BM:  11/16  Height:   Ht Readings from Last 1 Encounters:  05/21/15 _0  (1.626 m)    Weight:   Wt Readings from Last 1 Encounters:  05/21/15 111 lb 8.8 oz (50.6 kg)    Ideal Body Weight:  54.54 kg (kg)  BMI:  Body mass index is 19.14 kg/(m^2).  Estimated Nutritional Needs:   Kcal:  1250-1450  Protein:  45-55 grams  Fluid:  2 L/day  EDUCATION NEEDS:   No education needs identified at this time  Hilman Kissling, RD, LDN Inpatient Clinical Dietitian Pager # [phone] After hours/weekend pager # [phone]  

## 2015-05-21 NOTE — Progress Notes (Signed)
Patient transported via ambulance. Family is present at transfer.

## 2015-05-21 NOTE — Progress Notes (Signed)
IV d/c from right fa. Dressing changed to right lower leg. Report called to [facility], report given to [place]. No questions at this time. Phone number left for questions of needed.

## 2015-07-01 DEATH — deceased
